# Patient Record
Sex: Female | Born: 1940 | Race: Black or African American | Hispanic: No | Smoking: Former smoker
Health system: Southern US, Community
[De-identification: ages and names within clinical notes are randomized; demographics above are authoritative.]

## PROBLEM LIST (undated history)

## (undated) DIAGNOSIS — M3500 Sicca syndrome, unspecified: Secondary | ICD-10-CM

## (undated) DIAGNOSIS — C50919 Malignant neoplasm of unspecified site of unspecified female breast: Secondary | ICD-10-CM

## (undated) DIAGNOSIS — E119 Type 2 diabetes mellitus without complications: Secondary | ICD-10-CM

## (undated) DIAGNOSIS — E7800 Pure hypercholesterolemia, unspecified: Secondary | ICD-10-CM

## (undated) DIAGNOSIS — C169 Malignant neoplasm of stomach, unspecified: Secondary | ICD-10-CM

## (undated) HISTORY — DX: Pure hypercholesterolemia, unspecified: E78.00

## (undated) HISTORY — PX: OTHER SURGICAL HISTORY: SHX169

## (undated) HISTORY — PX: ABDOMINAL HYSTERECTOMY: SHX81

## (undated) HISTORY — DX: Type 2 diabetes mellitus without complications: E11.9

## (undated) HISTORY — DX: Malignant neoplasm of unspecified site of unspecified female breast: C50.919

## (undated) HISTORY — DX: Sjogren syndrome, unspecified: M35.00

---

## 1975-01-14 HISTORY — PX: OTHER SURGICAL HISTORY: SHX169

## 1975-01-14 HISTORY — PX: MASTECTOMY: SHX3

## 2004-10-18 ENCOUNTER — Ambulatory Visit (HOSPITAL_COMMUNITY): Admission: RE | Admit: 2004-10-18 | Discharge: 2004-10-18 | Payer: Self-pay | Admitting: Family Medicine

## 2007-07-26 ENCOUNTER — Encounter: Admission: RE | Admit: 2007-07-26 | Discharge: 2007-07-26 | Payer: Self-pay | Admitting: Family Medicine

## 2008-11-24 ENCOUNTER — Encounter: Admission: RE | Admit: 2008-11-24 | Discharge: 2008-11-24 | Payer: Self-pay | Admitting: Family Medicine

## 2009-12-24 ENCOUNTER — Encounter
Admission: RE | Admit: 2009-12-24 | Discharge: 2009-12-24 | Payer: Self-pay | Source: Home / Self Care | Attending: Family Medicine | Admitting: Family Medicine

## 2010-01-03 ENCOUNTER — Encounter
Admission: RE | Admit: 2010-01-03 | Discharge: 2010-01-03 | Payer: Self-pay | Source: Home / Self Care | Attending: Family Medicine | Admitting: Family Medicine

## 2010-01-10 ENCOUNTER — Encounter
Admission: RE | Admit: 2010-01-10 | Discharge: 2010-01-10 | Payer: Self-pay | Source: Home / Self Care | Attending: Family Medicine | Admitting: Family Medicine

## 2010-01-31 ENCOUNTER — Ambulatory Visit (HOSPITAL_COMMUNITY)
Admission: RE | Admit: 2010-01-31 | Discharge: 2010-01-31 | Payer: Self-pay | Source: Home / Self Care | Attending: Surgery | Admitting: Surgery

## 2010-01-31 ENCOUNTER — Encounter
Admission: RE | Admit: 2010-01-31 | Discharge: 2010-01-31 | Payer: Self-pay | Source: Home / Self Care | Attending: Surgery | Admitting: Surgery

## 2010-02-03 NOTE — Op Note (Signed)
  NAMESINDEE, STUCKER                   ACCOUNT NO.:  [id number]  MEDICAL RECORD NO.:  [id number]          PATIENT TYPE:  AMB  LOCATION:  SDS                          FACILITY:  [facility]  PHYSICIAN:  Currie Paris, M.D.DATE OF BIRTH:  04-11-1940  DATE OF PROCEDURE:  01/31/2010 DATE OF DISCHARGE:  01/31/2010                              OPERATIVE REPORT   PREOPERATIVE DIAGNOSIS:  Left breast mass upper outer quadrant.  POSTOPERATIVE DIAGNOSIS:  Left breast mass upper outer quadrant.  PROCEDURE:  Needle-guided removal of left breast mass.  SURGEON:  Currie Paris, MD  ANESTHESIA:  General.  CLINICAL HISTORY:  This is a 70 year old lady who is status post a right mastectomy for breast cancer who has recently been found to have a mass in the upper outer quadrant of the left breast that looked like a papilloma by biopsy, but excisional biopsy was recommended.  DESCRIPTION OF PROCEDURE:  I saw the patient in the holding area and confirmed the plans for the surgery as noted above.  I reviewed the guidewire localization films.  The patient was taken to the operating room and after satisfactory general anesthesia had been obtained, the left breast was prepped and draped.  The time-out was done.  I made a curvilinear incision over the approximate tract of the guidewire.  I manipulated the guidewire into the wound and then took a cylinder of tissue around the guidewire until I was beyond the tip.  I thought I was well around the guidewire in all directions.  The clip appeared to be anterior to the guidewire and the lesion posterior to the guidewire.  Careful palpation of the specimen showed that there was a nodular density in the proximal location of the mass posterior to the wires I thought, we had the area well excised.  Specimen mammogram confirmed this.  The incision was infiltrated with 0.25% plain Marcaine.  I made sure everything was dry using cautery or suture  ligatures and then closed with 3-0 Vicryl, 4-0 Monocryl, subcuticular plus Dermabond.  The patient tolerated the procedure well and there were no complications.  All counts were correct.     Currie Paris, M.D.     CJS/MEDQ  D:  01/31/2010  T:  02/01/2010  Job:  [id number]  cc:   Gloriajean Dell. Andrey Campanile, M.D.  Electronically Signed by Cyndia Bent M.D. on 02/03/2010 07:38:44 AM

## 2010-02-04 LAB — BASIC METABOLIC PANEL
BUN: 14 mg/dL (ref 6–23)
CO2: 26 mEq/L (ref 19–32)
Chloride: 108 mEq/L (ref 96–112)
Glucose, Bld: 80 mg/dL (ref 70–99)
Potassium: 4.4 mEq/L (ref 3.5–5.1)

## 2010-02-04 LAB — CBC
HCT: 33.7 % — ABNORMAL LOW (ref 36.0–46.0)
Hemoglobin: 10.7 g/dL — ABNORMAL LOW (ref 12.0–15.0)
MCV: 86.6 fL (ref 78.0–100.0)
RDW: 14.2 % (ref 11.5–15.5)
WBC: 10.7 10*3/uL — ABNORMAL HIGH (ref 4.0–10.5)

## 2010-02-04 LAB — GLUCOSE, POCT (MANUAL RESULT ENTRY)
Glucose, Bld: 115 mg/dL — ABNORMAL HIGH (ref 70–99)
Operator id: 140821

## 2010-02-04 LAB — GLUCOSE, CAPILLARY
Glucose-Capillary: 132 mg/dL — ABNORMAL HIGH (ref 70–99)
Glucose-Capillary: 119 mg/dL — ABNORMAL HIGH (ref 70–99)

## 2010-08-26 ENCOUNTER — Encounter (INDEPENDENT_AMBULATORY_CARE_PROVIDER_SITE_OTHER): Payer: PRIVATE HEALTH INSURANCE | Admitting: Ophthalmology

## 2010-08-26 DIAGNOSIS — E11319 Type 2 diabetes mellitus with unspecified diabetic retinopathy without macular edema: Secondary | ICD-10-CM

## 2010-08-26 DIAGNOSIS — H43819 Vitreous degeneration, unspecified eye: Secondary | ICD-10-CM

## 2010-09-18 ENCOUNTER — Encounter (INDEPENDENT_AMBULATORY_CARE_PROVIDER_SITE_OTHER): Payer: PRIVATE HEALTH INSURANCE | Admitting: Ophthalmology

## 2010-09-18 DIAGNOSIS — E11311 Type 2 diabetes mellitus with unspecified diabetic retinopathy with macular edema: Secondary | ICD-10-CM

## 2010-09-18 DIAGNOSIS — H43819 Vitreous degeneration, unspecified eye: Secondary | ICD-10-CM

## 2010-09-18 DIAGNOSIS — E11319 Type 2 diabetes mellitus with unspecified diabetic retinopathy without macular edema: Secondary | ICD-10-CM

## 2010-10-17 ENCOUNTER — Encounter (INDEPENDENT_AMBULATORY_CARE_PROVIDER_SITE_OTHER): Payer: PRIVATE HEALTH INSURANCE | Admitting: Ophthalmology

## 2011-01-10 ENCOUNTER — Other Ambulatory Visit (INDEPENDENT_AMBULATORY_CARE_PROVIDER_SITE_OTHER): Payer: Self-pay | Admitting: Surgery

## 2011-01-13 ENCOUNTER — Other Ambulatory Visit: Payer: Self-pay | Admitting: Family Medicine

## 2011-01-13 DIAGNOSIS — Z1231 Encounter for screening mammogram for malignant neoplasm of breast: Secondary | ICD-10-CM

## 2011-01-13 DIAGNOSIS — Z9011 Acquired absence of right breast and nipple: Secondary | ICD-10-CM

## 2011-02-03 ENCOUNTER — Ambulatory Visit
Admission: RE | Admit: 2011-02-03 | Discharge: 2011-02-03 | Disposition: A | Discharge status: Home / Self Care | Payer: PRIVATE HEALTH INSURANCE | Source: Ambulatory Visit | Attending: Family Medicine | Admitting: Family Medicine

## 2011-02-03 DIAGNOSIS — Z9011 Acquired absence of right breast and nipple: Secondary | ICD-10-CM

## 2011-02-03 DIAGNOSIS — Z1231 Encounter for screening mammogram for malignant neoplasm of breast: Secondary | ICD-10-CM

## 2011-12-24 ENCOUNTER — Encounter (INDEPENDENT_AMBULATORY_CARE_PROVIDER_SITE_OTHER): Payer: PRIVATE HEALTH INSURANCE | Admitting: Ophthalmology

## 2011-12-24 DIAGNOSIS — H43819 Vitreous degeneration, unspecified eye: Secondary | ICD-10-CM

## 2011-12-24 DIAGNOSIS — E1139 Type 2 diabetes mellitus with other diabetic ophthalmic complication: Secondary | ICD-10-CM

## 2011-12-24 DIAGNOSIS — E11319 Type 2 diabetes mellitus with unspecified diabetic retinopathy without macular edema: Secondary | ICD-10-CM

## 2011-12-24 DIAGNOSIS — H3581 Retinal edema: Secondary | ICD-10-CM

## 2012-01-21 ENCOUNTER — Other Ambulatory Visit (INDEPENDENT_AMBULATORY_CARE_PROVIDER_SITE_OTHER): Payer: PRIVATE HEALTH INSURANCE | Admitting: Ophthalmology

## 2012-02-09 ENCOUNTER — Other Ambulatory Visit (INDEPENDENT_AMBULATORY_CARE_PROVIDER_SITE_OTHER): Payer: PRIVATE HEALTH INSURANCE | Admitting: Ophthalmology

## 2012-02-09 DIAGNOSIS — H3581 Retinal edema: Secondary | ICD-10-CM

## 2012-02-09 DIAGNOSIS — E1139 Type 2 diabetes mellitus with other diabetic ophthalmic complication: Secondary | ICD-10-CM

## 2012-02-09 DIAGNOSIS — E1165 Type 2 diabetes mellitus with hyperglycemia: Secondary | ICD-10-CM

## 2012-06-14 ENCOUNTER — Ambulatory Visit (INDEPENDENT_AMBULATORY_CARE_PROVIDER_SITE_OTHER): Payer: PRIVATE HEALTH INSURANCE | Admitting: Ophthalmology

## 2014-04-04 ENCOUNTER — Encounter: Payer: Self-pay | Admitting: Neurology

## 2014-04-04 ENCOUNTER — Ambulatory Visit (INDEPENDENT_AMBULATORY_CARE_PROVIDER_SITE_OTHER): Payer: Medicare (Managed Care) | Admitting: Neurology

## 2014-04-04 VITALS — BP 122/56 | HR 68 | Temp 97.0°F | Ht 64.0 in | Wt 181.0 lb

## 2014-04-04 DIAGNOSIS — H4900 Third [oculomotor] nerve palsy, unspecified eye: Secondary | ICD-10-CM | POA: Insufficient documentation

## 2014-04-04 DIAGNOSIS — H4903 Third [oculomotor] nerve palsy, bilateral: Secondary | ICD-10-CM

## 2014-04-04 DIAGNOSIS — H02403 Unspecified ptosis of bilateral eyelids: Secondary | ICD-10-CM | POA: Diagnosis not present

## 2014-04-04 DIAGNOSIS — H02409 Unspecified ptosis of unspecified eyelid: Secondary | ICD-10-CM | POA: Insufficient documentation

## 2014-04-04 DIAGNOSIS — H539 Unspecified visual disturbance: Secondary | ICD-10-CM

## 2014-04-04 NOTE — Patient Instructions (Signed)
Overall you are doing fairly well but I do want to suggest a few things today:   Remember to drink plenty of fluid, eat healthy meals and do not skip any meals. Try to eat protein with a every meal and eat a healthy snack such as fruit or nuts in between meals. Try to keep a regular sleep-wake schedule and try to exercise daily, particularly in the form of walking, 20-30 minutes a day, if you can.   As far as diagnostic testing: MRI of the brain and labwork  I would like to see you back in 3 weeks, sooner if we need to. Please call us with any interim questions, concerns, problems, updates or refill requests.   Please also call us for any test results so we can go over those with you on the phone.  My clinical assistant and will answer any of your questions and relay your messages to me and also relay most of my messages to you.   Our phone number is [phone]. We also have an after hours call service for urgent matters and there is a physician on-call for urgent questions. For any emergencies you know to call 911 or go to the nearest emergency room

## 2014-04-04 NOTE — Progress Notes (Addendum)
[facility]    Provider:  Dr Jaynee Eagles Referring Provider: Janifer Adie, MD Primary Care Physician:  No primary care provider on file.  CC:  Droopy eyelids  HPI:  Terry Alexander is a 74 y.o. female here as a referral from Dr. 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 PMHx diabetes, HLD, HTN. Her eyes are her biggest problem, they are droopy and dry. She gets her medical care by [facility]. She has seen an eye doctor and has another appointment in April for her droopy and dry eyes. Going on for 2 years, worsening. She denies double vision. She has problems with vision and wears glasses but no significant worsening. Denies difficulty speaking, no SOB or CP or problems with being out of breath. She has generalized weakness and decreased appetite. She can climb stairs, no difficulty with low seats. Her eye lids are not as droopy in the morning, worse in the evenings. She is very fatigued. She wakes up at 2am and can't get back to sleep.Mother had droopy eyelids and had surgery but no family history of neuromuscular disease as far as she know.   Review of Systems: Patient complains of symptoms per HPI as well as the following symptoms: fevers/chills, loss of vision/blurred vision, joint pain, constipation, aching muscles, joint pain, memory loss, headache, numbness, slurred speech, dizziness, insomnia, sleepiness, restless legs. Pertinent negatives per HPI. All others negative.   History   Social History  . Marital Status: Widowed    Spouse Name: N/A  . Number of Children: 1  . Years of Education: 2 yr colle   Occupational History  . Retired     Social History Main Topics  . Smoking status: Former Smoker    Quit date: 01/14/1991  . Smokeless tobacco: Not on file  . Alcohol Use: No  . Drug Use: No  . Sexual Activity: Not on file   Other Topics Concern  . Not on file   Social History Narrative   Lives at home alone.   Caffeine use: soft drinks (2-3 per day)     Family History  Problem Relation Age of Onset  . Heart attack Mother   . Prostate cancer Father   . Heart attack Father   . Heart attack Son     Past Medical History  Diagnosis Date  . Diabetes   . High cholesterol   . Breast cancer     Right side    Past Surgical History  Procedure Laterality Date  . Other surgical history  1977    Right breast removed   . Tumor removed      Stomach  . Abdominal hysterectomy      Current Outpatient Prescriptions  Medication Sig Dispense Refill  . aspirin 81 MG tablet Take 81 mg by mouth daily.    Marland Kitchen atorvastatin (LIPITOR) 10 MG tablet Take 10 mg by mouth daily.    . cycloSPORINE (RESTASIS) 0.05 % ophthalmic emulsion 1 drop 2 (two) times daily.    . dorzolamide (TRUSOPT) 2 % ophthalmic solution 1 drop 3 (three) times daily.    . hydrochlorothiazide (HYDRODIURIL) 25 MG tablet Take 25 mg by mouth daily.    . insulin aspart protamine - aspart (NOVOLOG 70/30 MIX) (70-30) 100 UNIT/ML FlexPen Inject into the skin 2 (two) times daily. 10 units in the morning 10 units in the evening    . Insulin Pen Needle (NOVOFINE) 30G X 8 MM MISC Inject 1 packet into the skin as needed.    Marland Kitchen  losartan (COZAAR) 50 MG tablet Take 50 mg by mouth daily.    Marland Kitchen loteprednol (LOTEMAX) 0.5 % ophthalmic suspension 4 (four) times daily.    Vladimir Faster Glycol-Propyl Glycol (SYSTANE OP) Apply 1 drop to eye as needed.    . ranitidine (ZANTAC) 150 MG tablet Take 150 mg by mouth 2 (two) times daily.     No current facility-administered medications for this visit.    Allergies as of 04/04/2014  . (No Known Allergies)    Vitals: BP 122/56 mmHg  Pulse 68  Temp(Src) 97 F (36.1 C)  Ht 5\' 4"  (1.626 m)  Wt 181 lb (82.101 kg)  BMI 31.05 kg/m2 Last Weight:  Wt Readings from Last 1 Encounters:  04/04/14 181 lb (82.101 kg)   Last Height:   Ht Readings from Last 1 Encounters:  04/04/14 5\' 4"  (1.626 m)    Physical exam: Exam: Gen: NAD, conversant, well nourised,  obese, well groomed                     CV: RRR. No Carotid Bruits. No peripheral edema, warm, nontender Eyes: Conjunctivae clear without exudates or hemorrhage, mild proptosis  Neuro: Detailed Neurologic Exam  Speech:    Speech is normal; fluent and spontaneous with normal comprehension.  Cognition:    The patient is oriented to person, place, and time;     recent and remote memory intact;     language fluent;     normal attention, concentration,     fund of knowledge Cranial Nerves:     The pupils are equal, round, and reactive to light. The fundi are flat. Visual fields are full to finger confrontation. Extraocular movements are intact. Trigeminal sensation is intact and the muscles of mastication are normal. Bilateral ptosis left > right to the level of the pupils. No fatiguable upgaze. Cannot visualize pharynx due to malampati 4. Hearing intact. Voice is normal. Shoulder shrug is normal. The tongue has normal motion without fasciculations.   Coordination:some difficultty with HTS due to weakness but no dysmetria FTN or HTS Gait:   Cannot perform heel-toe and tandem gait. Wide based gait.  Motor Observation:    No asymmetry, no atrophy, and no involuntary movements noted. Tone:    Normal muscle tone.    Posture:    Posture is normal. normal erect    Strength:can get out of chair without using arms. Bilateral hip flexion weakness.       Sensation: intact to LT     Reflex Exam:  DTR's:    Deep tendon reflexes in the upper and lower extremities are normal bilaterally.   Toes:    The toes are equivocal bilaterally.   Clonus:    Clonus is absent.  Other: Ice Pack Test Negative      Assessment/Plan:  74 year old female with progressive significant bilateral ptosis, not convinced this is better in the morning and worse as the day progresses as patient says. No significant other symptoms of MG. Ice Pack Test Negative (however patient could not tolerate cold pack on eye so  may not have been long enough to see effects). Mildy suspicious for occular myasthenia, but not convincing. Will order lab tests unfortunately sometimes these are not as sensitive with occular MG as they are in generalized MG. May need anti-MuSK testing, may need to send her to [facility]. She has some proptosis, will order thyroid panel. Less likely in this case is structural disease of the brainstem which can cause  isolated ocular symptoms but should get an MRI of the brain.   Sarina Ill, MD  [facility] [address] [place], [place] [address]  Phone [phone] Fax [phone]

## 2014-04-08 LAB — COMPREHENSIVE METABOLIC PANEL
ALT: 6 IU/L (ref 0–32)
AST: 13 IU/L (ref 0–40)
Albumin/Globulin Ratio: 1.2 (ref 1.1–2.5)
Albumin: 4.1 g/dL (ref 3.5–4.8)
Alkaline Phosphatase: 73 IU/L (ref 39–117)
BUN/Creatinine Ratio: 10 — ABNORMAL LOW (ref 11–26)
BUN: 17 mg/dL (ref 8–27)
Bilirubin Total: 0.4 mg/dL (ref 0.0–1.2)
CALCIUM: 9.3 mg/dL (ref 8.7–10.3)
CO2: 21 mmol/L (ref 18–29)
CREATININE: 1.7 mg/dL — AB (ref 0.57–1.00)
Chloride: 102 mmol/L (ref 97–108)
GFR calc Af Amer: 34 mL/min/{1.73_m2} — ABNORMAL LOW (ref >59)
GFR calc non Af Amer: 29 mL/min/{1.73_m2} — ABNORMAL LOW (ref >59)
GLOBULIN, TOTAL: 3.3 g/dL (ref 1.5–4.5)
GLUCOSE: 141 mg/dL — AB (ref 65–99)
Potassium: 4.3 mmol/L (ref 3.5–5.2)
SODIUM: 138 mmol/L (ref 134–144)
Total Protein: 7.4 g/dL (ref 6.0–8.5)

## 2014-04-08 LAB — THYROID PANEL WITH TSH
Free Thyroxine Index: 1.8 (ref 1.2–4.9)
T3 Uptake Ratio: 28 % (ref 24–39)
T4 TOTAL: 6.6 ug/dL (ref 4.5–12.0)
TSH: 1.37 u[IU]/mL (ref 0.450–4.500)

## 2014-04-08 LAB — ACETYLCHOLINE RECEPTOR, BINDING: AChR Binding Ab, Serum: 0.08 nmol/L (ref 0.00–0.24)

## 2014-04-08 LAB — ACETYLCHOLINE RECEPTOR, MODULATING

## 2014-04-08 LAB — CK: Total CK: 80 U/L (ref 24–173)

## 2014-04-08 LAB — ACETYLCHOLINE RECEPTOR, BLOCKING: ACETYLCHOL BLOCK AB: 20 % (ref 0–25)

## 2014-04-08 LAB — VGCC ANTIBODY: VGCC Antibody: NEGATIVE

## 2014-04-24 ENCOUNTER — Ambulatory Visit (INDEPENDENT_AMBULATORY_CARE_PROVIDER_SITE_OTHER): Payer: Medicare (Managed Care) | Admitting: Neurology

## 2014-04-24 ENCOUNTER — Encounter: Payer: Self-pay | Admitting: Neurology

## 2014-04-24 ENCOUNTER — Telehealth: Payer: Self-pay | Admitting: *Deleted

## 2014-04-24 VITALS — BP 108/67 | HR 66 | Temp 98.3°F | Ht 64.0 in | Wt 182.0 lb

## 2014-04-24 DIAGNOSIS — H02403 Unspecified ptosis of bilateral eyelids: Secondary | ICD-10-CM

## 2014-04-24 MED ORDER — PYRIDOSTIGMINE BROMIDE 60 MG PO TABS: 60.0000 mg | ORAL_TABLET | Freq: Three times a day (TID) | ORAL | Status: AC

## 2014-04-24 NOTE — Telephone Encounter (Signed)
Release fax to Dr. Valetta Close requesting records.

## 2014-04-24 NOTE — Progress Notes (Signed)
[facility]    Provider:  Dr Jaynee Eagles Referring Provider: No ref. provider found Primary Care Physician:  No primary care provider on file.  CC:  Droopy eyelids  Spoke to ophthalmologist. His opinion is that this is not myasthenia gravis. Patient's symptoms are not significantly dependent on time of day. Antibodies to MG were negative. Cannot get anti-musk antibodies, [facility] will not pay for this test and patient cannot afford it. Discussed with patient. She was referred to a specialist by ophthalmology and we will follow her clinically. She declined an emncs to evaluate for any generalized myasthenia. She has no other symptoms to be suspicious for MG.  Initial visit 04/04/2014:  Terry Alexander is a 74 y.o. female here as a referral from Dr. 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 PMHx diabetes, HLD, HTN. Her eyes are her biggest problem, they are droopy and dry. She gets her medical care by [facility]. She has seen an eye doctor and has another appointment in April for her droopy and dry eyes. Going on for 2 years, worsening. She denies double vision. She has problems with vision and wears glasses but no significant worsening. Denies difficulty speaking, no SOB or CP or problems with being out of breath. She has generalized weakness and decreased appetite. She can climb stairs, no difficulty with low seats. Her eye lids are not as droopy in the morning, worse in the evenings. She is very fatigued. She wakes up at 2am and can't get back to sleep.Mother had droopy eyelids and had surgery but no family history of neuromuscular disease as far as she know.   Review of Systems: Patient complains of symptoms per HPI as well as the following symptoms:dizziness, headache, numbness, ringing in ears, nervous/anxious constipation, aching muscles. Pertinent negatives per HPI. All others negative.   History   Social History  . Marital Status: Widowed    Spouse Name: N/A  . Number of Children: 1    . Years of Education: 2 yr colle   Occupational History  . Retired     Social History Main Topics  . Smoking status: Former Smoker    Quit date: 01/14/1991  . Smokeless tobacco: Not on file  . Alcohol Use: No  . Drug Use: No  . Sexual Activity: Not on file   Other Topics Concern  . Not on file   Social History Narrative   Lives at home alone.   Right handed.   Caffeine use: soft drinks (2-3 per day)    Family History  Problem Relation Age of Onset  . Heart attack Mother   . Prostate cancer Father   . Heart attack Father   . Heart attack Son     Past Medical History  Diagnosis Date  . Diabetes   . High cholesterol   . Breast cancer     Right side    Past Surgical History  Procedure Laterality Date  . Other surgical history  1977    Right breast removed   . Tumor removed      Stomach  . Abdominal hysterectomy      Current Outpatient Prescriptions  Medication Sig Dispense Refill  . aspirin 81 MG tablet Take 81 mg by mouth daily.    Marland Kitchen atorvastatin (LIPITOR) 10 MG tablet Take 10 mg by mouth daily.    . cycloSPORINE (RESTASIS) 0.05 % ophthalmic emulsion 1 drop 2 (two) times daily.    . dorzolamide (TRUSOPT) 2 % ophthalmic solution 1 drop 3 (three) times  daily.    . hydrochlorothiazide (HYDRODIURIL) 25 MG tablet Take 25 mg by mouth daily.    . insulin aspart protamine - aspart (NOVOLOG 70/30 MIX) (70-30) 100 UNIT/ML FlexPen Inject into the skin 2 (two) times daily. 10 units in the morning 10 units in the evening    . Insulin Pen Needle (NOVOFINE) 30G X 8 MM MISC Inject 1 packet into the skin as needed.    Marland Kitchen losartan (COZAAR) 50 MG tablet Take 50 mg by mouth daily.    Marland Kitchen loteprednol (LOTEMAX) 0.5 % ophthalmic suspension 4 (four) times daily.    Vladimir Faster Glycol-Propyl Glycol (SYSTANE OP) Apply 1 drop to eye as needed.    . ranitidine (ZANTAC) 150 MG tablet Take 150 mg by mouth 2 (two) times daily.    Marland Kitchen pyridostigmine (MESTINON) 60 MG tablet Take 1 tablet (60 mg  total) by mouth 3 (three) times daily. 90 tablet 3   No current facility-administered medications for this visit.    Allergies as of 04/24/2014  . (No Known Allergies)    Vitals: BP 108/67 mmHg  Pulse 66  Temp(Src) 98.3 F (36.8 C)  Ht 5\' 4"  (1.626 m)  Wt 182 lb (82.555 kg)  BMI 31.22 kg/m2 Last Weight:  Wt Readings from Last 1 Encounters:  04/24/14 182 lb (82.555 kg)   Last Height:   Ht Readings from Last 1 Encounters:  04/24/14 5\' 4"  (1.626 m)   Cranial Nerves:   The pupils are equal, round, and reactive to light. The fundi are flat. Visual fields are full to finger confrontation. Extraocular movements are intact. Trigeminal sensation is intact and the muscles of mastication are normal. Bilateral ptosis left > right to the level of the pupils. No fatiguable upgaze. Cannot visualize pharynx due to malampati 4. Hearing intact. Voice is normal. Shoulder shrug is normal. The tongue has normal motion without fasciculations. Ice pack test negative   Coordination:some difficultty with HTS due to weakness but no dysmetria FTN or HTS Gait: Cannot perform heel-toe and tandem gait. Wide based gait.  Motor Observation:  No asymmetry, no atrophy, and no involuntary movements noted. Tone:  Normal muscle tone.   Posture:  Posture is normal. normal erect   Strength:can get out of chair without using arms. Bilateral hip flexion weakness.    Sensation: intact to LT        Assessment/Plan:  74 year old female with progressive significant bilateral ptosis, not convinced this is better in the morning and worse as the day progresses. No significant other symptoms of MG. Ice Pack Test Negative. 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 His opinion is that this is not myasthenia gravis. Antibodies to MG were negative. Cannot get anti-musk antibodies, [facility] will not pay for this test and patient cannot afford it. Discussed with patient. She was  referred to a specialist by ophthalmology and we will follow her clinically and see what specialist findings are.   She declined an emncs to evaluate for any generalized myasthenia. She has no other symptoms to be suspicious for MG.   She has some proptosis, will thyroid panel normal . Less likely in this case is structural disease of the brainstem which can cause isolated ocular symptoms but should get an MRI of the brain.   Discussed chronic kidney disease and gave a copy of her labs to discuss with her pcp   Sarina Ill, MD  [facility] [address] [place], [place] [address]  Phone  [phone] Fax [phone]  A total of 15 minutes was spent face-to-face with this patient. Over half this time was spent on counseling patient on the myasthenia gravis diagnosis and different diagnostic and therapeutic options available.

## 2014-04-25 ENCOUNTER — Encounter: Payer: Self-pay | Admitting: *Deleted

## 2014-08-02 ENCOUNTER — Ambulatory Visit: Payer: Medicare (Managed Care) | Admitting: Neurology

## 2015-03-08 ENCOUNTER — Other Ambulatory Visit: Payer: Self-pay | Admitting: Family Medicine

## 2015-03-08 DIAGNOSIS — Z1231 Encounter for screening mammogram for malignant neoplasm of breast: Secondary | ICD-10-CM

## 2015-03-20 ENCOUNTER — Ambulatory Visit
Admission: RE | Admit: 2015-03-20 | Discharge: 2015-03-20 | Disposition: A | Discharge status: Home / Self Care | Payer: Medicare (Managed Care) | Source: Ambulatory Visit | Attending: Family Medicine | Admitting: Family Medicine

## 2015-03-20 DIAGNOSIS — Z1231 Encounter for screening mammogram for malignant neoplasm of breast: Secondary | ICD-10-CM

## 2016-02-13 ENCOUNTER — Other Ambulatory Visit: Payer: Self-pay | Admitting: Family Medicine

## 2016-02-13 DIAGNOSIS — Z1231 Encounter for screening mammogram for malignant neoplasm of breast: Secondary | ICD-10-CM

## 2016-03-25 ENCOUNTER — Ambulatory Visit: Payer: Medicare (Managed Care)

## 2018-10-04 ENCOUNTER — Other Ambulatory Visit: Payer: Self-pay | Admitting: Nurse Practitioner

## 2018-10-04 ENCOUNTER — Ambulatory Visit
Admission: RE | Admit: 2018-10-04 | Discharge: 2018-10-04 | Disposition: A | Discharge status: Home / Self Care | Payer: No Typology Code available for payment source | Source: Ambulatory Visit | Attending: Nurse Practitioner | Admitting: Nurse Practitioner

## 2018-10-04 DIAGNOSIS — R52 Pain, unspecified: Secondary | ICD-10-CM

## 2018-10-04 DIAGNOSIS — W19XXXA Unspecified fall, initial encounter: Secondary | ICD-10-CM

## 2019-07-11 DIAGNOSIS — E7800 Pure hypercholesterolemia, unspecified: Secondary | ICD-10-CM | POA: Insufficient documentation

## 2019-07-11 DIAGNOSIS — E119 Type 2 diabetes mellitus without complications: Secondary | ICD-10-CM | POA: Insufficient documentation

## 2019-07-11 DIAGNOSIS — I10 Essential (primary) hypertension: Secondary | ICD-10-CM | POA: Insufficient documentation

## 2019-12-06 ENCOUNTER — Ambulatory Visit (HOSPITAL_COMMUNITY)
Admission: RE | Admit: 2019-12-06 | Discharge: 2019-12-06 | Disposition: A | Discharge status: Home / Self Care | Payer: Medicare (Managed Care) | Source: Ambulatory Visit | Attending: Vascular Surgery | Admitting: Vascular Surgery

## 2019-12-06 ENCOUNTER — Other Ambulatory Visit: Payer: Self-pay

## 2019-12-06 ENCOUNTER — Other Ambulatory Visit (HOSPITAL_COMMUNITY): Payer: Self-pay | Admitting: Family Medicine

## 2019-12-06 DIAGNOSIS — M79661 Pain in right lower leg: Secondary | ICD-10-CM | POA: Diagnosis present

## 2019-12-20 NOTE — Progress Notes (Signed)
Office Visit Note  Patient: Terry Alexander             Date of Birth: Jan 06, 1941           MRN: [id number]             PCP: [facility] Referring: Ernestina Columbia, MD Visit Date: 12/21/2019   Subjective:   History of Present Illness: Terry Alexander is a 79 y.o. 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 Symptoms are now improving and she is continuing just ophthalmic treatment for this. During these past 2 months she also describes increased skin color changes on her arms, dry mouth, and since about 2 weeks ago increasing leg swelling which was evaluated in the ED that did not find any blood clot. Overall most of these symptoms are new although she has had dry eyes and mouth for years without major complications. She denies hair loss, lymphadenopathy, fevers, shortness of breath, raynaud's phenomenon. She does report having some unintentional weight loss but that it is now improving.  Activities of Daily Living:  Patient reports morning stiffness for 0 minutes.   Patient Denies nocturnal pain.  Difficulty dressing/grooming: Denies Difficulty climbing stairs: Reports Difficulty getting out of chair: Denies Difficulty using hands for taps, buttons, cutlery, and/or writing: Denies  Review of Systems  Constitutional: Positive for weight loss. Negative for fatigue.  HENT: Positive for mouth dryness. Negative for mouth sores and nose dryness.   Eyes: Positive for dryness. Negative for pain, itching and visual disturbance.  Respiratory: Negative for cough, hemoptysis, shortness of breath and difficulty breathing.   Cardiovascular: Positive for swelling in legs/feet. Negative for chest pain and palpitations.  Gastrointestinal: Positive for constipation and diarrhea. Negative for abdominal pain and blood in stool.  Endocrine: Negative for increased  urination.  Genitourinary: Negative for painful urination.  Musculoskeletal: Negative for arthralgias, joint pain, joint swelling, myalgias, muscle weakness, morning stiffness, muscle tenderness and myalgias.  Skin: Negative for color change, rash and redness.  Allergic/Immunologic: Negative for susceptible to infections.  Neurological: Negative for dizziness, numbness, headaches, memory loss and weakness.  Hematological: Negative for swollen glands.  Psychiatric/Behavioral: Negative for confusion and sleep disturbance.    PMFS History:  Patient Active Problem List   Diagnosis Date Noted  . Positive ANA (antinuclear antibody) 12/21/2019  . 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/2019  . Lower extremity edema 12/21/2019  . 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 Diabetes mellitus ([place]) 07/11/2019  . Hypercholesterolemia 07/11/2019  . Hypertensive disorder 07/11/2019  . 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 Vision changes 04/04/2014    Past Medical History:  Diagnosis Date  . Breast cancer (HCC)    Right side  . Diabetes ([place])   . High cholesterol     Family History  Problem Relation Age of Onset  . Heart attack Mother   . Prostate cancer Father   . Heart attack Father   . Heart attack Son    Past Surgical History:  Procedure Laterality Date  . ABDOMINAL HYSTERECTOMY    . MASTECTOMY Right 1977  . OTHER SURGICAL HISTORY  1977   Right breast removed   . Tumor removed     Stomach   Social History   Social History Narrative   Lives at home alone.   Right handed.   Caffeine use: soft drinks (2-3 per day)  Immunization History  Administered Date(s) Administered  . Moderna Sars-Covid-2 Vaccination 02/15/2019, 03/15/2019     Objective: Vital Signs: BP 110/69 (BP Location: Left Arm, Patient Position: Sitting, Cuff Size: Small)   Pulse 89   Ht 5' 2.75" (1.594 m)   Wt 146 lb 12.8 oz (66.6 kg)   BMI 26.21 kg/m    Physical Exam HENT:     Right  Ear: External ear normal.     Left Ear: External ear normal.     Mouth/Throat:     Mouth: Mucous membranes are dry.  Eyes:     Comments: Conjunctiva injected  Cardiovascular:     Rate and Rhythm: Normal rate and regular rhythm.  Pulmonary:     Effort: Pulmonary effort is normal.     Breath sounds: Normal breath sounds.  Skin:    General: Skin is warm and dry.     Comments: Perifollicular hyperpigmentation present on arms on extensor surfaces, with some patches of confluent hyperpigmentation  Decreased nailfold capillary density without giant capillaries or hemorrhages  Shiny appearance with decreased hair overlying shin, ankle, and foot R>L  Neurological:     General: No focal deficit present.     Mental Status: She is alert.  Psychiatric:        Mood and Affect: Mood normal.      Musculoskeletal Exam:  Neck full range of motion no tenderness Shoulder, elbow, wrist, fingers full range of motion no tenderness or swelling Normal hip internal and external rotation without pain Knees patellofemoral crepitus, without palpable effusions, ankles significant overlying pitting edema bilaterally R>L    Investigation: No additional findings.  Imaging: VAS [place] LOWER EXTREMITY VENOUS (DVT)  Result Date: 12/06/2019  Lower Venous DVT Study Indications: Pain, and Swelling.  Performing Technologist: Ralene Cork RVT  Examination Guidelines: A complete evaluation includes B-mode imaging, spectral Doppler, color Doppler, and power Doppler as needed of all accessible portions of each vessel. Bilateral testing is considered an integral part of a complete examination. Limited examinations for reoccurring indications may be performed as noted. The reflux portion of the exam is performed with the patient in reverse Trendelenburg.  +---------+---------------+---------+-----------+----------+--------------+ RIGHT    CompressibilityPhasicitySpontaneityPropertiesThrombus Aging  +---------+---------------+---------+-----------+----------+--------------+ CFV      Full           Yes      Yes                                 +---------+---------------+---------+-----------+----------+--------------+ SFJ      Full                    Yes                                 +---------+---------------+---------+-----------+----------+--------------+ FV Prox  Full           Yes      Yes                                 +---------+---------------+---------+-----------+----------+--------------+ FV Mid   Full           Yes      Yes                                 +---------+---------------+---------+-----------+----------+--------------+  FV DistalFull           Yes      Yes                                 +---------+---------------+---------+-----------+----------+--------------+ POP      Full           Yes      Yes                                 +---------+---------------+---------+-----------+----------+--------------+ PTV      Full                    Yes                                 +---------+---------------+---------+-----------+----------+--------------+ PERO     Full                    Yes                                 +---------+---------------+---------+-----------+----------+--------------+ GSV      Full           Yes      Yes                                 +---------+---------------+---------+-----------+----------+--------------+  Findings reported to left voicemail at 9:08 am.  Summary: RIGHT: - There is no evidence of deep vein thrombosis in the lower extremity. - There is no evidence of superficial venous thrombosis.  - A cystic structure is found in the popliteal fossa. - Interstitial fluid in the calf.   *See table(s) above for measurements and observations. Electronically signed by Monica Martinez MD on 12/06/2019 at 1:23:38 PM.    Final     Recent Labs: Lab Results  Component Value Date   WBC 7.7 12/21/2019    HGB 9.9 (L) 12/21/2019   PLT 391 12/21/2019   NA 135 12/21/2019   K 4.4 12/21/2019   CL 102 12/21/2019   CO2 25 12/21/2019   GLUCOSE 195 (H) 12/21/2019   BUN 14 12/21/2019   CREATININE 1.02 (H) 12/21/2019   BILITOT 0.4 12/21/2019   ALKPHOS 73 04/04/2014   AST 16 12/21/2019   ALT 9 12/21/2019   PROT 5.8 (L) 12/21/2019   ALBUMIN 4.1 04/04/2014   CALCIUM 8.5 (L) 12/21/2019   GFRAA 60 12/21/2019    Speciality Comments: No specialty comments available.  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 This seems to be improving with current ophthalmology treatment but would benefit from steroid sparing DMARD limited data in optimal choice but azathioprine or methotrexate may be first line options. Will obtain baseline labs including CBC, CMP, immunoglobulins, and chest xray. Interestingly she also has highly positive  anticentromere antibodies. Her upper extremity perifollicular hyperpigmentation is consistent with systemic sclerosis but no raynaud's phenomenon or other obvious manifestations. With her lower extremity edema may need screening for pulmonary hypertension but would start with baseline chest xray.  Lower extremity edema - Plan: Protein / creatinine ratio, urine  She reports her current R>L bilateral lower extremity edema is a recent development. Will check urine P/C ratio and CXR for evidence of nephrotic syndrome or pulmonary congestion.  Orders: Orders Placed This Encounter  Procedures  . DG Chest 2 View  . Sedimentation  rate  . IgG, IgA, IgM  . CBC with Differential/Platelet  . Protein / creatinine ratio, urine  . COMPLETE METABOLIC PANEL WITH GFR   No orders of the defined types were placed in this encounter.    Follow-Up Instructions: Return in about 4 weeks (around 01/18/2020).   Collier Salina, MD  Note - This record has been created using [employer].  Chart creation errors have been sought, but may not always  have been located. Such creation errors do not reflect on  the standard of medical care.

## 2019-12-21 ENCOUNTER — Encounter: Payer: Self-pay | Admitting: Internal Medicine

## 2019-12-21 ENCOUNTER — Ambulatory Visit (INDEPENDENT_AMBULATORY_CARE_PROVIDER_SITE_OTHER): Payer: Medicare (Managed Care) | Admitting: Internal Medicine

## 2019-12-21 ENCOUNTER — Other Ambulatory Visit: Payer: Self-pay

## 2019-12-21 VITALS — BP 110/69 | HR 89 | Ht 62.75 in | Wt 146.8 lb

## 2019-12-21 DIAGNOSIS — R768 Other specified abnormal immunological findings in serum: Secondary | ICD-10-CM | POA: Diagnosis not present

## 2019-12-21 DIAGNOSIS — M3501 Sicca syndrome with keratoconjunctivitis: Secondary | ICD-10-CM | POA: Diagnosis not present

## 2019-12-21 DIAGNOSIS — R600 Localized edema: Secondary | ICD-10-CM

## 2019-12-21 DIAGNOSIS — H16003 Unspecified corneal ulcer, bilateral: Secondary | ICD-10-CM | POA: Diagnosis not present

## 2019-12-21 DIAGNOSIS — H16009 Unspecified corneal ulcer, unspecified eye: Secondary | ICD-10-CM | POA: Insufficient documentation

## 2019-12-21 NOTE — Patient Instructions (Signed)
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  Your eye dryness and inflammation and mouth dryness are related to this. I would continue follow up with the ophthalmologist for management of your eye problem.  For mouth dryness I would start with sugar free gum or lozenges and biotene mouth spray for symptoms, prescription medication may cause more side effect than benefits unless you have much worse pain.  You also have antibodies that can be associated with scleroderma. This can cause problems of the skin, lung, esophagus, blood vessels, and kidney so I recommend getting a chest xray to screen for changes and also checking your urine for protein if there is any problem in the kidney. The leg swelling is not usually a feature of this disease, but your doctors may consider an ultrasound of the heart if no other cause is apparent.   Sjgren's Syndrome Sjgren's syndrome is a disease in which the body's disease-fighting system (immune system) attacks the glands that produce tears (lacrimal glands) and the glands that produce saliva (salivary glands). This makes the eyes and mouth very dry. Sjgren's syndrome is a long-term (chronic) disorder that has no cure. In some cases, it is linked to other disorders (rheumatic disorders), such as rheumatoid arthritis and systemic lupus erythematosus (SLE). It may affect other parts of the body, such as the:  Kidneys.  Blood vessels.  Joints.  Lungs.  Liver.  Pancreas.  Brain.  Nerves.  Spinal cord. What are the causes? The cause of this condition is not known. It may be passed along from parent to child (inherited), or it may be a symptom of a rheumatic disorder. What increases the risk? This condition is more likely to develop in:  Women.  People who are 70-78 years old.  People who have recently had a viral infection or currently have a viral infection. What are the signs or symptoms? The main symptoms of this condition  are:  Dry mouth. This may include: ? A chalky feeling. ? Difficulty swallowing, speaking, or tasting. ? Frequent cavities in the teeth. ? Frequent mouth infections.  Dry eyes. This may include: ? Burning, redness, and itching. ? Blurry vision. ? Light sensitivity. Other symptoms may include:  Dryness of the skin and the inside of the nose.  Eyelid infections.  Vaginal dryness, if this applies.  Joint pain and stiffness.  Muscle pain and stiffness. How is this diagnosed? This condition is diagnosed based on:  Your symptoms.  Your medical history.  A physical exam of your eyes and mouth.  Tests, including: ? A Schirmer test. This tests your tear production. ? An eye exam that is done with a magnifying device (slit-lamp exam). ? An eye test that temporarily stains your eye with dye. This shows the extent of eye damage. ? Tests to check your salivary gland function. ? Biopsy. This is a removal of part of a salivary gland from inside your lower lip to be studied under a microscope. ? Chest X-rays. ? Blood tests. ? Urine tests. How is this treated? There is no cure for this condition, but treatment can help you manage your symptoms. This condition may be treated with:  Moisture replacement therapies to help relieve dryness in your skin, mouth, and eyes.  Medicines to help relieve pain and stiffness.  Medicines to help relieve inflammation in your body (corticosteroids). These are usually for severe cases.  Medicines to help reduce the activity of your immune system (immunosuppressants).  Surgery or insertion of plugs to close  the lacrimal glands (punctal occlusion). This helps keep more natural tears in your eyes. Follow these instructions at home: Eye care   Use eye drops as told by your health care provider.  Protect your eyes from the sun and wind with sunglasses or glasses.  Blink at least 5-6 times a minute.  Maintain properly humidified air. You may want  to use a humidifier at home.  Avoid smoke. Mouth care  Brush your teeth and floss after every meal.  Chew sugar-free gum or suck on hard candy. This may help to relieve dry mouth.  Use antimicrobial mouthwash daily.  Take frequent sips of water or sugar-free drinks.  Use saliva substitutes or lip balm as told by your health care provider.  Schedule and attend dentist visits every 6 months. General instructions   Take over-the-counter and prescription medicines only as told by your health care provider.  Drink enough fluid to keep your urine pale yellow.  Keep all follow-up visits as told by your health care provider. This is important. Contact a health care provider if:  You have a fever.  You have night sweats.  You are always tired.  You have unexplained weight loss.  You develop itchy skin.  You have red patches on your skin.  You have a lump or swelling on your neck. Summary  Sjgren's syndrome is a disease in which the body's disease-fighting system attacks the glands that produce tears and the glands that produce saliva.  This condition makes the eyes and mouth very dry.  Sjgren's syndrome is a long-term (chronic) disorder that has no cure.  The cause of this condition is not known.  There is no cure for this condition, but treatment can help you manage your symptoms.

## 2019-12-22 LAB — COMPLETE METABOLIC PANEL WITH GFR
AG Ratio: 1.1 (calc) (ref 1.0–2.5)
ALT: 9 U/L (ref 6–29)
AST: 16 U/L (ref 10–35)
Albumin: 3 g/dL — ABNORMAL LOW (ref 3.6–5.1)
Alkaline phosphatase (APISO): 88 U/L (ref 37–153)
BUN/Creatinine Ratio: 14 (calc) (ref 6–22)
BUN: 14 mg/dL (ref 7–25)
CO2: 25 mmol/L (ref 20–32)
Calcium: 8.5 mg/dL — ABNORMAL LOW (ref 8.6–10.4)
Chloride: 102 mmol/L (ref 98–110)
Creat: 1.02 mg/dL — ABNORMAL HIGH (ref 0.60–0.88)
GFR, Est African American: 60 mL/min/{1.73_m2} (ref >=60)
GFR, Est Non African American: 52 mL/min/{1.73_m2} — ABNORMAL LOW (ref >=60)
Globulin: 2.8 g/dL (calc) (ref 1.9–3.7)
Glucose, Bld: 195 mg/dL — ABNORMAL HIGH (ref 65–99)
Potassium: 4.4 mmol/L (ref 3.5–5.3)
Sodium: 135 mmol/L (ref 135–146)
Total Bilirubin: 0.4 mg/dL (ref 0.2–1.2)
Total Protein: 5.8 g/dL — ABNORMAL LOW (ref 6.1–8.1)

## 2019-12-22 LAB — CBC WITH DIFFERENTIAL/PLATELET
Absolute Monocytes: 724 cells/uL (ref 200–950)
Basophils Absolute: 39 cells/uL (ref 0–200)
Basophils Relative: 0.5 %
Eosinophils Absolute: 270 cells/uL (ref 15–500)
Eosinophils Relative: 3.5 %
HCT: 30.4 % — ABNORMAL LOW (ref 35.0–45.0)
Hemoglobin: 9.9 g/dL — ABNORMAL LOW (ref 11.7–15.5)
Lymphs Abs: 2695 cells/uL (ref 850–3900)
MCH: 32 pg (ref 27.0–33.0)
MCHC: 32.6 g/dL (ref 32.0–36.0)
MCV: 98.4 fL (ref 80.0–100.0)
MPV: 10.4 fL (ref 7.5–12.5)
Monocytes Relative: 9.4 %
Neutro Abs: 3973 cells/uL (ref 1500–7800)
Neutrophils Relative %: 51.6 %
Platelets: 391 10*3/uL (ref 140–400)
RBC: 3.09 10*6/uL — ABNORMAL LOW (ref 3.80–5.10)
RDW: 13.5 % (ref 11.0–15.0)
Total Lymphocyte: 35 %
WBC: 7.7 10*3/uL (ref 3.8–10.8)

## 2019-12-22 LAB — PROTEIN / CREATININE RATIO, URINE
Creatinine, Urine: 96 mg/dL (ref 20–275)
Protein/Creat Ratio: 198 mg/g creat — ABNORMAL HIGH (ref 21–161)
Protein/Creatinine Ratio: 0.198 mg/mg creat — ABNORMAL HIGH (ref 0.021–0.16)
Total Protein, Urine: 19 mg/dL (ref 5–24)

## 2019-12-22 LAB — IGG, IGA, IGM
IgG (Immunoglobin G), Serum: 1351 mg/dL (ref 600–1540)
IgM, Serum: 112 mg/dL (ref 50–300)
Immunoglobulin A: 275 mg/dL (ref 70–320)

## 2019-12-22 LAB — SEDIMENTATION RATE: Sed Rate: 6 mm/h (ref 0–30)

## 2020-01-18 ENCOUNTER — Ambulatory Visit: Payer: Medicare (Managed Care) | Admitting: Internal Medicine

## 2020-01-31 ENCOUNTER — Ambulatory Visit: Payer: Medicare (Managed Care) | Admitting: Internal Medicine

## 2020-01-31 NOTE — Progress Notes (Deleted)
Office Visit Note  Patient: Terry Alexander             Date of Birth: 20-Nov-1940           MRN: [id number]             PCP: [facility] [place] Referring: [place] Visit Date: 01/31/2020   Subjective:  No chief complaint on file.   History of Present Illness: Terry Alexander is a 80 y.o. female here for follow up of sjogren's syndrome with keratoconjunctivitis sicca and recent development of corneal melt receiving initial treatment with steroids.***     No Rheumatology ROS completed.   PMFS History:  Patient Active Problem List   Diagnosis Date Noted  . Positive ANA (antinuclear antibody) 12/21/2019  . 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/2019  . Lower extremity edema 12/21/2019  . 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 Diabetes mellitus ([place]) 07/11/2019  . Hypercholesterolemia 07/11/2019  . Hypertensive disorder 07/11/2019  . 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 Vision changes 04/04/2014    Past Medical History:  Diagnosis Date  . Breast cancer (HCC)    Right side  . Diabetes ([place])   . High cholesterol     Family History  Problem Relation Age of Onset  . Heart attack Mother   . Prostate cancer Father   . Heart attack Father   . Heart attack Son    Past Surgical History:  Procedure Laterality Date  . ABDOMINAL HYSTERECTOMY    . MASTECTOMY Right 1977  . OTHER SURGICAL HISTORY  1977   Right breast removed   . Tumor removed     Stomach   Social History   Social History Narrative   Lives at home alone.   Right handed.   Caffeine use: soft drinks (2-3 per day)   Immunization History  Administered Date(s) Administered  . Moderna Sars-Covid-2 Vaccination 02/15/2019, 03/15/2019     Objective: Vital Signs: There were no vitals taken for this visit.   Physical Exam   Musculoskeletal Exam: ***  CDAI Exam: CDAI Score: -- Patient Global: --; Provider Global: -- Swollen: --; Tender: -- Joint  Exam 01/31/2020   No joint exam has been documented for this visit   There is currently no information documented on the homunculus. Go to the Rheumatology activity and complete the homunculus joint exam.  Investigation: No additional findings.  Imaging: No results found.  Recent Labs: Lab Results  Component Value Date   WBC 7.7 12/21/2019   HGB 9.9 (L) 12/21/2019   PLT 391 12/21/2019   NA 135 12/21/2019   K 4.4 12/21/2019   CL 102 12/21/2019   CO2 25 12/21/2019   GLUCOSE 195 (H) 12/21/2019   BUN 14 12/21/2019   CREATININE 1.02 (H) 12/21/2019   BILITOT 0.4 12/21/2019   ALKPHOS 73 04/04/2014   AST 16 12/21/2019   ALT 9 12/21/2019   PROT 5.8 (L) 12/21/2019   ALBUMIN 4.1 04/04/2014   CALCIUM 8.5 (L) 12/21/2019   GFRAA 60 12/21/2019    Speciality Comments: No specialty comments available.  Procedures:  No procedures performed Allergies: Codeine   Assessment / Plan:     Visit Diagnoses: No diagnosis found.  Orders: No orders of the defined types were placed in this encounter.  No orders of the defined types were placed in this encounter.   Face-to-face time spent with patient was *** minutes. Greater than 50% of time was spent  in counseling and coordination of care.  Follow-Up Instructions: No follow-ups on file.   Collier Salina, MD  Note - This record has been created using [employer].  Chart creation errors have been sought, but may not always  have been located. Such creation errors do not reflect on  the standard of medical care.

## 2020-02-03 ENCOUNTER — Other Ambulatory Visit: Payer: Self-pay | Admitting: Vascular Surgery

## 2020-02-03 ENCOUNTER — Ambulatory Visit
Admission: RE | Admit: 2020-02-03 | Discharge: 2020-02-03 | Disposition: A | Discharge status: Home / Self Care | Payer: Medicare (Managed Care) | Source: Ambulatory Visit | Attending: Vascular Surgery | Admitting: Vascular Surgery

## 2020-02-03 ENCOUNTER — Other Ambulatory Visit: Payer: Self-pay

## 2020-02-03 DIAGNOSIS — I272 Pulmonary hypertension, unspecified: Secondary | ICD-10-CM

## 2020-02-12 NOTE — Progress Notes (Signed)
Office Visit Note  Patient: Terry Alexander             Date of Birth: September 18, 1940           MRN: [id number]             PCP: [facility] [place] Referring: [place] Visit Date: 02/13/2020   Subjective:  Follow-up   History of Present Illness: Terry Alexander is a 80 y.o. 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 Since her last visit steroid eye drops were decreased to once daily with apparently good control of the inflammation. Otherwise she is feeling in about her usual health. Skin rash on the arms she thinks is about the same, leg swellign is slightly better than before.    Review of Systems  Constitutional: Negative for fatigue.  HENT: Negative for mouth sores, mouth dryness and nose dryness.   Eyes: Positive for dryness. Negative for pain, itching and visual disturbance.  Respiratory: Negative for cough, hemoptysis, shortness of breath and difficulty breathing.   Cardiovascular: Negative for chest pain, palpitations and swelling in legs/feet.  Gastrointestinal: Negative for abdominal pain, blood in stool, constipation and diarrhea.  Endocrine: Negative for increased urination.  Genitourinary: Negative for painful urination.  Musculoskeletal: Positive for arthralgias, joint pain, joint swelling, myalgias, muscle weakness, morning stiffness, muscle tenderness and myalgias.  Skin: Negative for color change, rash and redness.  Allergic/Immunologic: Negative for susceptible to infections.  Neurological: Negative for dizziness, numbness, headaches, memory loss and weakness.  Hematological: Negative for swollen glands.  Psychiatric/Behavioral: Negative for confusion and sleep disturbance.    PMFS History:  Patient Active Problem List   Diagnosis Date Noted  . High risk medication use 02/13/2020  . Positive ANA (antinuclear  antibody) 12/21/2019  . 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/2019  . Lower extremity edema 12/21/2019  . 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 Diabetes mellitus ([place]) 07/11/2019  . Hypercholesterolemia 07/11/2019  . Hypertensive disorder 07/11/2019  . 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 Vision changes 04/04/2014    Past Medical History:  Diagnosis Date  . Breast cancer (HCC)    Right side  . Diabetes ([place])   . High cholesterol     Family History  Problem Relation Age of Onset  . Heart attack Mother   . Prostate cancer Father   . Heart attack Father   . Heart attack Son    Past Surgical History:  Procedure Laterality Date  . ABDOMINAL HYSTERECTOMY    . MASTECTOMY Right 1977  . OTHER SURGICAL HISTORY  1977   Right breast removed   . Tumor removed     Stomach   Social History   Social History Narrative   Lives at home alone.   Right handed.   Caffeine use: soft drinks (2-3 per day)   Immunization History  Administered Date(s) Administered  . Moderna Sars-Covid-2 Vaccination 02/15/2019, 03/15/2019, 12/22/2019     Objective: Vital Signs: BP 103/68 (BP Location: Left Arm, Patient Position: Sitting, Cuff Size: Normal)   Pulse 92   Ht 5\' 3"  (1.6 m)   Wt 140 lb 6.4 oz (63.7 kg)   BMI 24.87 kg/m    Physical Exam HENT:     Right Ear: External ear normal.     Left Ear: External ear normal.  Eyes:     Extraocular Movements: Extraocular  movements intact.     Conjunctiva/sclera: Conjunctivae normal.  Cardiovascular:     Rate and Rhythm: Normal rate and regular rhythm.  Pulmonary:     Effort: Pulmonary effort is normal.     Breath sounds: Normal breath sounds.  Skin:    General: Skin is warm and dry.     Comments: Forearm skin discoloration with perifollicular hyperpigmentation, no skin thickening or calcifications  Neurological:     General: No focal deficit present.     Mental Status: She is alert.      Musculoskeletal Exam:   Elbow, wrist, fingers full range of motion no tenderness or swelling Normal hip internal and external rotation without pain Knees patellofemoral crepitus with full ROM Ankles overlying pitting edema bilaterally no tenderness good ROM  Investigation: No additional findings.  Imaging: DG Chest 2 View  Result Date: 02/03/2020 CLINICAL DATA:  Pulmonary hypertension, diabetes mellitus, breast cancer post RIGHT mastectomy EXAM: CHEST - 2 VIEW COMPARISON:  01/30/2010 FINDINGS: Upper normal heart size. Mediastinal contours and pulmonary vascularity normal. Atherosclerotic calcification aorta. Minimal bibasilar atelectasis or scarring. Lungs otherwise clear. No acute infiltrate, pleural effusion or pneumothorax. Bones appear demineralized. Surgical clips in the mid abdomen. IMPRESSION: Minimal bibasilar atelectasis versus scarring. No acute abnormalities. Electronically Signed   By: Lavonia Dana M.D.   On: 02/03/2020 16:44    Recent Labs: Lab Results  Component Value Date   WBC 7.7 12/21/2019   HGB 9.9 (L) 12/21/2019   PLT 391 12/21/2019   NA 135 12/21/2019   K 4.4 12/21/2019   CL 102 12/21/2019   CO2 25 12/21/2019   GLUCOSE 195 (H) 12/21/2019   BUN 14 12/21/2019   CREATININE 1.02 (H) 12/21/2019   BILITOT 0.4 12/21/2019   ALKPHOS 73 04/04/2014   AST 16 12/21/2019   ALT 9 12/21/2019   PROT 5.8 (L) 12/21/2019   ALBUMIN 4.1 04/04/2014   CALCIUM 8.5 (L) 12/21/2019   GFRAA 60 12/21/2019    Speciality Comments: No specialty comments available.  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 Positive ANA with also anti-centromere Abs and the skin discoloration suggests possibly some overlap but lacks characteristic  criteria for SSc diagnosis. Due to the severe eye inflammation she developed would recommend some type of steroid sparing DMARD treatment. Low dose methotrexate most suitable with comorbidity profile, will need low dose especially while on maintenance PPI so 10mg  PO weekly. Will contact Dr. Romeo Apple office to confirm plan or other recommendations or concerns.  High risk medication use - Plan: Hepatitis B core antibody, IgM, Hepatitis B surface antigen, Hepatitis C antibody  Methotrexate carries risks of toxicity with liver injury or cytopenias requiring regular lab monitoring currently has some anemia but no history of leukopenia. Also checking baseline hepatitis serology and had CXR without evidence for ILD so no particular risk factors.  Orders: Orders Placed This Encounter  Procedures  . Hepatitis B core antibody, IgM  . Hepatitis B surface antigen  . Hepatitis C antibody   No orders of the defined types were placed in this encounter.   Follow-Up Instructions: Return in about 3 months (around 05/12/2020).   Collier Salina, MD  Note - This record has been created using [employer].  Chart creation errors have been sought, but may  not always  have been located. Such creation errors do not reflect on  the standard of medical care.  

## 2020-02-13 ENCOUNTER — Other Ambulatory Visit: Payer: Self-pay

## 2020-02-13 ENCOUNTER — Ambulatory Visit (INDEPENDENT_AMBULATORY_CARE_PROVIDER_SITE_OTHER): Payer: Medicare (Managed Care) | Admitting: Internal Medicine

## 2020-02-13 ENCOUNTER — Encounter: Payer: Self-pay | Admitting: Internal Medicine

## 2020-02-13 VITALS — BP 103/68 | HR 92 | Ht 63.0 in | Wt 140.4 lb

## 2020-02-13 DIAGNOSIS — H16003 Unspecified corneal ulcer, bilateral: Secondary | ICD-10-CM

## 2020-02-13 DIAGNOSIS — Z79899 Other long term (current) drug therapy: Secondary | ICD-10-CM

## 2020-02-13 DIAGNOSIS — R768 Other specified abnormal immunological findings in serum: Secondary | ICD-10-CM | POA: Diagnosis not present

## 2020-02-13 DIAGNOSIS — M3501 Sicca syndrome with keratoconjunctivitis: Secondary | ICD-10-CM | POA: Diagnosis not present

## 2020-02-13 DIAGNOSIS — R7689 Other specified abnormal immunological findings in serum: Secondary | ICD-10-CM

## 2020-02-13 NOTE — Patient Instructions (Signed)
I am checking blood tests for viral hepatitis to make sure this is not a problem before starting new medication. For your eye inflammation and skin changes methotrexate may be beneficial to prevent recurrence of severe inflammation. We will confirm plan with your regular doctors and Dr. Valetta Close, possibly starting medication once weekly and would need to follow up every 3 months if taking it.   Methotrexate tablets What is this medicine? METHOTREXATE (METH oh TREX ate) is a chemotherapy drug used to treat cancer including breast cancer, leukemia, and lymphoma. This medicine can also be used to treat psoriasis and certain kinds of arthritis. This medicine may be used for other purposes; ask your health care provider or pharmacist if you have questions. COMMON BRAND NAME(S): Rheumatrex, Trexall What should I tell my health care provider before I take this medicine? They need to know if you have any of these conditions:  fluid in the stomach area or lungs  if you often drink alcohol  infection or immune system problems  kidney disease or on hemodialysis  liver disease  low blood counts, like low white cell, platelet, or red cell counts  lung disease  radiation therapy  stomach ulcers  ulcerative colitis  an unusual or allergic reaction to methotrexate, other medicines, foods, dyes, or preservatives  pregnant or trying to get pregnant  breast-feeding How should I use this medicine? Take this medicine by mouth with a glass of water. Follow the directions on the prescription label. Take your medicine at regular intervals. Do not take it more often than directed. Do not stop taking except on your doctor's advice. Make sure you know why you are taking this medicine and how often you should take it. If this medicine is used for a condition that is not cancer, like arthritis or psoriasis, it should be taken weekly, NOT daily. Taking this medicine more often than directed can cause serious  side effects, even death. Talk to your healthcare provider about safe handling and disposal of this medicine. You may need to take special precautions. Talk to your pediatrician regarding the use of this medicine in children. While this drug may be prescribed for selected conditions, precautions do apply. Overdosage: If you think you have taken too much of this medicine contact a poison control center or emergency room at once. NOTE: This medicine is only for you. Do not share this medicine with others. What if I miss a dose? If you miss a dose, talk with your doctor or health care professional. Do not take double or extra doses. What may interact with this medicine? Do not take this medicine with any of the following medications:  acitretin This medicine may also interact with the following medication:  aspirin and aspirin-like medicines including salicylates  azathioprine  certain antibiotics like penicillins, tetracycline, and chloramphenicol  certain medicines that treat or prevent blood clots like warfarin, apixaban, dabigatran, and rivaroxaban  certain medicines for stomach problems like esomeprazole, omeprazole, pantoprazole  cyclosporine  dapsone  diuretics  gold  hydroxychloroquine  live virus vaccines  medicines for infection like acyclovir, adefovir, amphotericin B, bacitracin, cidofovir, foscarnet, ganciclovir, gentamicin, pentamidine, vancomycin  mercaptopurine  NSAIDs, medicines for pain and inflammation, like ibuprofen or naproxen  other cytotoxic agents  pamidronate  pemetrexed  penicillamine  phenylbutazone  phenytoin  probenecid  pyrimethamine  retinoids such as isotretinoin and tretinoin  steroid medicines like prednisone or cortisone  sulfonamides like sulfasalazine and trimethoprim/sulfamethoxazole  theophylline  zoledronic acid This list may not describe all  possible interactions. Give your health care provider a list of all the  medicines, herbs, non-prescription drugs, or dietary supplements you use. Also tell them if you smoke, drink alcohol, or use illegal drugs. Some items may interact with your medicine. What should I watch for while using this medicine? Avoid alcoholic drinks. This medicine can make you more sensitive to the sun. Keep out of the sun. If you cannot avoid being in the sun, wear protective clothing and use sunscreen. Do not use sun lamps or tanning beds/booths. You may need blood work done while you are taking this medicine. Call your doctor or health care professional for advice if you get a fever, chills or sore throat, or other symptoms of a cold or flu. Do not treat yourself. This drug decreases your body's ability to fight infections. Try to avoid being around people who are sick. This medicine may increase your risk to bruise or bleed. Call your doctor or health care professional if you notice any unusual bleeding. Be careful brushing or flossing your teeth or using a toothpick because you may get an infection or bleed more easily. If you have any dental work done, tell your dentist you are receiving this medicine. Check with your doctor or health care professional if you get an attack of severe diarrhea, nausea and vomiting, or if you sweat a lot. The loss of too much body fluid can make it dangerous for you to take this medicine. Talk to your doctor about your risk of cancer. You may be more at risk for certain types of cancers if you take this medicine. Do not become pregnant while taking this medicine or for 6 months after stopping it. Women should inform their health care provider if they wish to become pregnant or think they might be pregnant. Men should not father a child while taking this medicine and for 3 months after stopping it. There is potential for serious harm to an unborn child. Talk to your health care provider for more information. Do not breast-feed an infant while taking this medicine  or for 1 week after stopping it. This medicine may make it more difficult to get pregnant or father a child. Talk to your health care provider if you are concerned about your fertility. What side effects may I notice from receiving this medicine? Side effects that you should report to your doctor or health care professional as soon as possible:  allergic reactions like skin rash, itching or hives, swelling of the face, lips, or tongue  breathing problems or shortness of breath  diarrhea  dry, nonproductive cough  low blood counts - this medicine may decrease the number of white blood cells, red blood cells and platelets. You may be at increased risk for infections and bleeding.  mouth sores  redness, blistering, peeling or loosening of the skin, including inside the mouth  signs of infection - fever or chills, cough, sore throat, pain or trouble passing urine  signs and symptoms of bleeding such as bloody or black, tarry stools; red or dark-brown urine; spitting up blood or brown material that looks like coffee grounds; red spots on the skin; unusual bruising or bleeding from the eye, gums, or nose  signs and symptoms of kidney injury like trouble passing urine or change in the amount of urine  signs and symptoms of liver injury like dark yellow or brown urine; general ill feeling or flu-like symptoms; light-colored stools; loss of appetite; nausea; right upper belly pain; unusually weak  or tired; yellowing of the eyes or skin Side effects that usually do not require medical attention (report to your doctor or health care professional if they continue or are bothersome):  dizziness  hair loss  tiredness  upset stomach  vomiting This list may not describe all possible side effects. Call your doctor for medical advice about side effects. You may report side effects to FDA at 1-800-FDA-1088. Where should I keep my medicine? Keep out of the reach of children and pets. Store at  room temperature between 20 and 25 degrees C (68 and 77 degrees F). Protect from light. Get rid of any unused medicine after the expiration date. Talk to your health care provider about how to dispose of unused medicine. Special directions may apply.

## 2020-02-14 LAB — HEPATITIS B CORE ANTIBODY, IGM: Hep B C IgM: NONREACTIVE

## 2020-02-14 LAB — HEPATITIS C ANTIBODY
Hepatitis C Ab: NONREACTIVE
SIGNAL TO CUT-OFF: 0.01 (ref <1.00)

## 2020-02-14 LAB — HEPATITIS B SURFACE ANTIGEN: Hepatitis B Surface Ag: NONREACTIVE

## 2020-02-17 MED ORDER — METHOTREXATE 2.5 MG PO TABS: 10.0000 mg | ORAL_TABLET | ORAL | 0 refills | Status: DC

## 2020-02-17 MED ORDER — FOLIC ACID 1 MG PO TABS: 1.0000 mg | ORAL_TABLET | Freq: Every day | ORAL | 0 refills | Status: DC

## 2020-02-17 NOTE — Progress Notes (Signed)
Lab tests are normal no hepatitis infection. Recommend starting methotrexate 10mg  (4 tablets) once weekly and take folic acid 1 mg daily. We should follow up in 3-4 weeks after starting medication for check labs and make sure no side effect problems.

## 2020-02-17 NOTE — Addendum Note (Signed)
Addended by: Collier Salina on: 02/17/2020 01:42 PM   Modules accepted: Orders

## 2020-03-20 NOTE — Progress Notes (Signed)
Office Visit Note  Patient: Terry Alexander             Date of Birth: July 21, 1940           MRN: [id number]             PCP: [facility] [place] Referring: [place] Visit Date: 03/21/2020   Subjective:  Follow-up (Patient's symptoms seem to be improved. )   History of Present Illness: Terry Alexander is a 80 y.o. female here for follow up of sjogren's syndrome with keratoconjunctivitis and complicated by corneal melt after starting methotrexate 10 mg about 5 weeks ago. She feels that her symptoms are doing well overall. She has a bit more energy and she has no new problems with eye pain or redness. She has not noticed any new diarrhea, rash, cough, or ulcers after starting the methotrexate. She has swelling in her lower legs no joint swelling.     Review of Systems  Constitutional: Negative for fatigue.  HENT: Negative for mouth sores, mouth dryness and nose dryness.   Eyes: Negative for pain, itching, visual disturbance and dryness.  Respiratory: Negative for cough, hemoptysis, shortness of breath and difficulty breathing.   Cardiovascular: Positive for swelling in legs/feet. Negative for chest pain and palpitations.  Gastrointestinal: Positive for constipation. Negative for abdominal pain, blood in stool and diarrhea.  Endocrine: Negative for increased urination.  Genitourinary: Negative for painful urination.  Musculoskeletal: Negative for arthralgias, joint pain, joint swelling, myalgias, muscle weakness, morning stiffness, muscle tenderness and myalgias.  Skin: Negative for color change, rash and redness.  Allergic/Immunologic: Negative for susceptible to infections.  Neurological: Negative for dizziness, numbness, headaches, memory loss and weakness.  Hematological: Negative for swollen glands.  Psychiatric/Behavioral: Negative for confusion and sleep disturbance.   Previous HPI:  Terry Alexander is a 80 y.o. 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 Since her last visit steroid eye drops were decreased to once daily with apparently good control of the inflammation. Otherwise she is feeling in about her usual health. Skin rash on the arms she thinks is about the same, leg swellign is slightly better than before.   PMFS History:  Patient Active Problem List   Diagnosis Date Noted  . High risk medication use 02/13/2020  . 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/2019  . Lower extremity edema 12/21/2019  . 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 Diabetes mellitus ([place]) 07/11/2019  . Hypercholesterolemia 07/11/2019  . Hypertensive disorder 07/11/2019  . 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 Vision changes 04/04/2014    Past Medical History:  Diagnosis Date  . Breast cancer (HCC)    Right side  . Diabetes ([place])   . High cholesterol     Family History  Problem Relation Age of Onset  . Heart attack Mother   . Prostate cancer Father   . Heart attack Father   . Heart attack Son    Past Surgical History:  Procedure Laterality Date  . ABDOMINAL HYSTERECTOMY    . MASTECTOMY Right 1977  . OTHER SURGICAL HISTORY  1977   Right breast removed   . Tumor removed     Stomach   Social History   Social History Narrative   Lives at home alone.   Right handed.   Caffeine use: soft drinks (2-3 per day)   Immunization History  Administered Date(s) Administered  . Moderna Sars-Covid-2 Vaccination 02/15/2019, 03/15/2019, 12/22/2019     Objective: Vital Signs: BP 131/83 (BP Location: Left Arm, Patient Position: Sitting, Cuff Size: Normal)   Pulse 78   Ht 5\' 3"  (1.6 m)   Wt 138 lb 9.6 oz (62.9 kg)   BMI 24.55 kg/m    Physical Exam Eyes:     Conjunctiva/sclera: Conjunctivae normal.     Comments: Ptosis b/l  Skin:    General: Skin is warm and dry.     Comments: Irregular patches of  hyperpigmented skin on forearms b/l 1+ pitting edema in lower legs above ankles  Neurological:     General: No focal deficit present.     Mental Status: She is alert.  Psychiatric:        Mood and Affect: Mood normal.     Musculoskeletal Exam:  Elbows full ROM no tenderness or swelling Wrists full ROM no tenderness or swelling Fingers full ROM no tenderness or swelling Knees full ROM no tenderness or swelling, patellofemoral crepitus present     Investigation: No additional findings.  Imaging: No results found.  Recent Labs: Lab Results  Component Value Date   WBC 7.7 12/21/2019   HGB 9.9 (L) 12/21/2019   PLT 391 12/21/2019   NA 135 12/21/2019   K 4.4 12/21/2019   CL 102 12/21/2019   CO2 25 12/21/2019   GLUCOSE 195 (H) 12/21/2019   BUN 14 12/21/2019   CREATININE 1.02 (H) 12/21/2019   BILITOT 0.4 12/21/2019   ALKPHOS 73 04/04/2014   AST 16 12/21/2019   ALT 9 12/21/2019   PROT 5.8 (L) 12/21/2019   ALBUMIN 4.1 04/04/2014   CALCIUM 8.5 (L) 12/21/2019   GFRAA 60 12/21/2019    Speciality Comments: No specialty comments available.  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 Plan to continue at 10mg  dose weekly if normal lab results, effective dose may be altered by concurrent PPI use.  High risk medication use - Plan: CBC with Differential/Platelet, COMPLETE METABOLIC PANEL WITH GFR  Methotrexate requires monitoring for liver toxicity or cytopenias, and she has history of leukopenia in the past. Checking CBC, CMP today. Continue taking folic acid 1mg  daily. She does not describe any new rash, fever,  cough, or dyspnea.  Corneal melting of both eyes  Eye inflammation is apparently well controlled from last ophthalmology visit. Currently using topical drops and now on MTX as well.  Orders: Orders Placed This Encounter  Procedures  . CBC with Differential/Platelet  . COMPLETE METABOLIC PANEL WITH GFR   No orders of the defined types were placed in this encounter.    Follow-Up Instructions: Return in about 3 months (around 06/21/2020) for pSS on MTX f/u.   Collier Salina, MD  Note - This record has been created using [employer].  Chart creation errors have been sought, but may not always  have been located. Such creation errors do not reflect on  the standard of medical care.

## 2020-03-21 ENCOUNTER — Ambulatory Visit (INDEPENDENT_AMBULATORY_CARE_PROVIDER_SITE_OTHER): Payer: Medicare (Managed Care) | Admitting: Internal Medicine

## 2020-03-21 ENCOUNTER — Encounter: Payer: Self-pay | Admitting: Internal Medicine

## 2020-03-21 ENCOUNTER — Other Ambulatory Visit: Payer: Self-pay

## 2020-03-21 VITALS — BP 131/83 | HR 78 | Ht 63.0 in | Wt 138.6 lb

## 2020-03-21 DIAGNOSIS — H16003 Unspecified corneal ulcer, bilateral: Secondary | ICD-10-CM

## 2020-03-21 DIAGNOSIS — M3501 Sicca syndrome with keratoconjunctivitis: Secondary | ICD-10-CM

## 2020-03-21 DIAGNOSIS — Z79899 Other long term (current) drug therapy: Secondary | ICD-10-CM

## 2020-03-21 NOTE — Patient Instructions (Signed)
Complete Blood Count Why am I having this test? A complete blood count (CBC) is a blood test that measures the cells of your blood. The types of cells are red blood cells (RBCs), white blood cells (WBCs), and blood cell fragments that help with clotting (platelets). Changes in these cells can warn your health care provider about many conditions, including anemia, infection, inflammation, bleeding, and blood-related cancer. You may have this test:  As part of a routine physical exam.  To help your health care provider diagnose certain health conditions.  To help your health care provider monitor known health conditions or treatments. What is being tested? A CBC measures your RBCs, WBCs, platelets, and their different components. RBCs are made in the tissue inside your bones (bone marrow) and released into your blood. These cells contain a protein that carries oxygen in your blood (hemoglobin). CBC testing of RBCs includes:  RBC count. This is the total number of RBCs.  Hemoglobin (Hb). This is the total amount of hemoglobin.  Hematocrit (Hct). This is the percentage of space that red blood cells take up in your blood.  Mean corpuscular volume (MCV). This is the average size of red blood cells.  Mean corpuscular hemoglobin [facility]). This is the average amount of hemoglobin inside each red blood cell.  Mean corpuscular hemoglobin concentration (MCHC). This is the average concentration of hemoglobin inside each red blood cell.  Red blood cell distribution width (RDW). This may be included to measure the variation in the size of red blood cells. WBCs are also made in your bone marrow. They are part of your body's disease-fighting system (immune system). CBC testing of WBCs includes:  WBC count. This is the total number of WBCs.  A count of the five types of WBCs: ? Neutrophils. ? Lymphocytes. ? Monocytes. ? Eosinophils. ? Basophils. Platelets are cell fragments that are important for  blood clotting. A CBC measures:  Total platelet count.  Mean platelet volume (MPV). What kind of sample is taken? A blood sample is required for this test. It is usually collected by inserting a needle into a blood vessel.   How are the results reported? Your test results will be reported as values. Your health care provider will compare your results to normal ranges that were established after testing a large group of people (reference ranges). Reference ranges may vary among labs and hospitals. Reference ranges for a CBC usually apply only to people who are older than 18. For this test, common reference ranges for people older than 18 may be: Red blood cells  RBC count ? Men: 4.7-6.1 ? Women: 4.2-5.4  Hb ? Men: 51-18 ? Women: 12-16  Hct ? Men: 42-52% ? Women: 37-47%  MCV: 80-95  MCH: 27-31  MCHC: 32-36  RDW: 11-14.5% White blood cells  WBC count: 5,000-10,000  Neutrophil count: 2500-8000 or 55-70%  Lymphocyte count: 1000-4000 or 20-40%  Monocyte count: 100-700 or 2-8%  Eosinophil count: 50-500 or 1-4%  Basophil count: 25-100 or 0.5-1% Platelets  Platelet count: 150,000-400,000  MPV: 7.4-10.4 What do the results mean? Results that are outside the reference ranges may indicate that you have an infection or other health condition. Red blood cells  RBC count, Hb, and Hct ? Results lower than the reference ranges may indicate anemia. Anemia may be caused by several conditions, such as iron deficiency anemia. ? Results higher than the reference ranges may indicate polycythemia. This may be caused by several conditions, such as chronic obstructive pulmonary disease (  COPD).  MCV, MCH, MCHC, and RDW ? Results outside the reference ranges may indicate a condition that causes anemia. White blood cells  WBC count ? Results lower than the reference range may indicate an infection or a condition that keeps your bone marrow from making new WBCs. ? Results higher than  the reference range may indicate an infection, a condition that causes inflammation (autoimmune disease), or a blood-related cancer.  Neutrophils, lymphocytes, and monocytes ? Results outside the reference ranges may indicate an infection, an autoimmune disease, or certain types of cancer.  Eosinophils and basophils ? Results outside the reference ranges may indicate an allergy, an inflammatory condition such as asthma, or blood cell cancer. Platelets  Platelet count ? Results lower than the reference range may indicate an infection or blood cell cancer. ? Results higher than the reference range may indicate certain types of anemia, an autoimmune disease, or certain cancers.  MPV ? High or low results may indicate a bone marrow abnormality. Talk with your health care provider about what your results mean. Questions to ask your health care provider Ask your health care provider or the department that is doing the test:  When will my results be ready?  How will I get my results?  What are my treatment options?  What other tests do I need?  What are my next steps? Summary  A CBC is a routine blood test that measures the cells in your blood.  Changes in these cells can indicate many conditions, including anemia, infection, inflammation, and blood cell cancer.  A health care provider will collect a sample of your blood for this test by inserting a needle into a blood vessel.  Talk with your health care provider about what your results mean.    Comprehensive Metabolic Panel Why am I having this test? Your health care provider may order the comprehensive metabolic panel (CMP) to check the level of numerous substances in your blood. These include minerals in your body (electrolytes). It also checks the balance of body fluids (hydration or dehydration). The test results can also help tell you and your health care provider how well your liver and kidneys are working. What is being  tested? The comprehensive metabolic panel measures the levels of the following substances in your blood:  Glucose. Glucose is a simple sugar that serves as the main source of energy for your body.  Creatinine. Creatinine is a waste product of normal muscle activity. It is excreted from the body by the kidneys.  Blood urea nitrogen (BUN). Urea nitrogen is a waste product of protein breakdown. It is produced when excess protein in your body is broken down and used for energy. It is excreted by the kidneys.  Electrolytes. Electrolytes are negatively or positively charged particles that are dissolved in the water of different body compartments. This includes the serum portion of blood, water inside cells, and water outside cells. Concentrations of electrolytes vary among the different fluid compartments. Electrolytes are tightly regulated to maintain a salt-water and acid-base balance in the body. The electrolytes include: ? Potassium (K+). ? Sodium (Na+). ? Chloride (Cl-). ? Calcium (Ca). ? Bicarbonate (HCO3-).  Alkaline phosphatase (ALP). This is a protein that is found in all tissues of your body. The bones, liver, and bile ducts have the highest amounts.  Alanine aminotransferase (ALT). ALT is an enzyme found throughout the body. The highest concentrations of ALT are in the tissues of the liver. If your liver is injured, there will also  be ALT in your bloodstream.  Aspartate aminotransferase (AST). This is another enzyme found mostly in your liver. There is also a high concentration in your muscle cells and heart. Testing for AST helps check for liver damage.  Bilirubin. As the liver breaks down red blood cells, it produces a waste product called bilirubin. A high level of bilirubin can indicate certain health problems.  Albumin. This is a protein that is a major component of the liquid part of blood (serum). It is made by the liver and can be measured in the bloodstream.  Total protein.  This is a measurement of the amount of the two types of protein found in blood serum--albumin and globulins. Globulins are part of your body's defense (immune) system. What kind of sample is taken? A blood sample is required for this test. It is usually collected by inserting a needle into a blood vessel.   How do I prepare for this test? Your health care provider may ask you not to eat or drink anything for 6-8 hours before your blood sample is taken. Follow instructions from your health care provider about eating and drinking. How are the results reported? Your test results will be reported as values. Your health care provider will compare your results to normal ranges that were established after testing a large group of people (reference ranges). Reference ranges may vary among labs and hospitals. For this test, common reference ranges are: Glucose  Umbilical cord: 16-10 mg/dL or 2.5-5.3 mmol/L (SI units).  Premature infant: 20-60 mg/dL or 1.1-3.3 mmol/L.  Neonate: 30-60 mg/dL or 1.7-3.3 mmol/L.  Infant: 40-90 mg/dL or 2.2-5.0 mmol/L.  Child younger than 2 years: 60-100 mg/dL or 3.3-5.5 mmol/L.  Adult or child older than 2 years: ? Fasting: 70-110 mg/dL or less than 6.1 mmol/L. ? Random (non-fasting): less than or equal to 200 mg/dL or less than 11.1 mmol/L. Creatinine  Child younger than 2 years: 0.1-0.4 mg/dL.  Child 51-24 years old: 0.2-0.5 mg/dL.  Child 31-58 years old: 0.3-0.6 mg/dL.  Child or adolescent 66-24 years old: 0.4-1.0 mg/dL.  Adult 88-56 years old: ? Female: 0.5-1.0 mg/dL. ? Female: 0.6-1.2 mg/dL.  Adult 57-63 years old: ? Female: 0.5-1.1 mg/dL. ? Female: 0.6-1.3 mg/dL.  Adult 93 years and older: ? Female: 0.5-1.2 mg/dL. ? Female: 0.7-1.3 mg/dL. BUN  Umbilical cord: 96-04 mg/dL.  Newborn: 3-12 mg/dL.  Infant: 5-18 mg/dL.  Child: 5-18 mg/dL.  Adult: 10-20 mg/dL or 3.6-7.1 mmol/L (SI units). Potassium (K+)  Newborn: 3.9-5.9 mEq/L.  Infant: 4.1-5.3  mEq/L.  Child: 3.4-4.7 mEq/L.  Adult or elderly: 3.5-5.0 mEq/L or 3.5-5.0 mmol/L (SI units). Sodium (Na+)  Newborn: 134-144 mEq/L.  Infant: 134-150 mEq/L.  Child: 136-145 mEq/L.  Adult or elderly: 136-145 mEq/L or 136-145 mmol/L (SI units). Chloride (Cl-)  Premature infant: 95-110 mEq/L.  Newborn: 96-106 mEq/L.  Child: 90-110 mEq/L.  Adult or elderly: 98-106 mEq/L or 98-106 mmol/L (SI units). Calcium (Ca)  Total calcium: ? Umbilical cord: 5.4-09.8 mg/dL or 2.25-2.88 mmol/L. ? Newborn less than 69 days old: 7.6-10.4 mg/dL or 1.9-2.60 mmol/L. ? Infant 75 days to 68 years old: 9.0-10.6 mg/dL or 2.3-2.65 mmol/L. ? Child: 8.8-10.8 mg/dL or 2.2-2.7 mmol/L. ? Adult: 9.0-10.5 mg/dL or 2.25-2.62 mmol/L.  Ionized calcium: ? Newborn: 4.20-5.58 mg/dL or 1.05-1.37 mmol/L. ? Child 2 months to 78 years old: 4.80-5.52 mg/dL or 1.20-1.38 mmol/L. ? Adult: 4.5-5.6 mg/dL or 1.05-1.30 mmol/L. Bicarbonate (HCO3-)  Newborn: 13-22 mEq/L.  Infant: 20-28 mEq/L.  Child: 20-28 mEq/L.  Adult or elderly: 23-30  mEq/L or 23-30 mmol/L (SI units). ALP  Child less than 2 years: 85-235 units/L.  16-52 years old: 65-210 units/L.  75-6 years old: 60-300 units/L.  33-16 years old: 30-200 units/L.  Adult: 30-120 units/L or 0.5-2.0 microkatals/L (SI units). ALT  Child or adult: 4-36 international units/L at 98.87F (37C) or 4-36 units/L (SI units). AST  Newborn 61-1 days old: 35-140 units/L.  Child less than 3 years: 15-60 units/L.  32-70 years old: 15-50 units/L.  28-72 years old: 10-50 units/L.  63-60 years old: 10-40 units/L.  Adult: 0-35 units/L or 0-0.58 microkatals/L (SI units). Bilirubin  Total bilirubin for newborn: 1.0-12.0 mg/dL or 17.1-205 micromoles/L (SI units).  Adult, elderly, or child: ? Total bilirubin: 0.3-1.0 mg/dL or 5.1-17 micromoles/L (SI units). ? Indirect bilirubin: 0.2-0.8 mg/dL or 3.4-12.0 micromoles/L (SI units). ? Direct bilirubin: 0.1-0.3 mg/dL or 1.7-5.1  micromoles/L (SI units). Albumin  Premature infant: 3.0-4.2 g/dL.  Newborn: 3.5-5.4 g/dL.  Infant: 4.4-5.4 g/dL.  Child: 4.0-5.9 g/dL.  Adult or elderly: 3.5-5.0 g/dL or 35-50 g/L (SI units). Total protein  Premature infant: 4.2-7.6 g/dL.  Newborn: 4.6-7.4 g/dL.  Infant: 6.0-6.7 g/dL.  Child: 6.2-8.0 g/dL.  Adult or elderly: 6.4-8.3 g/dL or 64-83 g/L (SI units). What do the results mean? Diet and levels of activity can influence your test results. Sometimes they can be the cause of values that are outside of normal limits. However, sometimes values outside of normal limits can indicate a medical condition. Possible results for this test are: Glucose  Abnormally high glucose level (hyperglycemia). Possible causes of this include: ? Prediabetes mellitus. ? Diabetes mellitus. ? Severe stress caused by surgery, stroke, trauma. ? Overactive thyroid gland. ? Pancreatitis or pancreatic cancer.  Abnormally low glucose level (hypoglycemia). Possible causes include: ? Underactive thyroid gland. ? Insulin-secreting tumors, also called insulinoma (rare). Creatinine  Abnormally high creatinine level. Possible causes include: ? Kidney failure. ? Overactive thyroid (hyperthyroidism). ? Abnormal production of growth hormone in the body (acromegaly or gigantism). ? Abnormal breakdown of muscle tissue (rhabdomyolysis). ? Early muscular dystrophy.  Abnormally low creatinine level. Possible causes include: ? Low muscle mass associated with malnutrition. ? Late-stage muscular dystrophy. BUN  Abnormally high BUN level. This generally means that your kidneys are not functioning normally, especially if levels are greater than 50 mg/dL.  Abnormally low BUN level. This may indicate malnutrition or liver failure. Potassium  Abnormally high potassium level (hyperkalemia). Possible causes include: ? Kidney disease. ? Massive destruction of red blood cells (hemolysis). ? Adrenal gland  failure (Addison's disease).  Abnormally low potassium level (hypokalemia). Possible causes include: ? Excessive levels of the hormone aldosterone (hyperaldosteronism). Sodium  Abnormally high sodium level (hypernatremia). This can be caused by: ? Dehydration. ? Urination due to low levels of antidiuretic hormone (diabetes insipidus). ? Hyperaldosteronism. ? Excessive levels of cortisol in the body (Cushing's syndrome).  Abnormally low sodium level (hyponatremia). This can be caused by: ? Congestive heart failure. ? Cirrhosis of the liver. ? Kidney failure. ? Syndrome of inappropriate antidiuretic hormone (SIADH). Chloride  Abnormally high chloride level (hyperchloremia). This can be caused by: ? Acute kidney failure. ? Diabetes insipidus. ? Prolonged diarrhea. ? Poisoning with aspirin or bromide.  Abnormally low chloride level (hypochloremia). This can be caused by: ? Prolonged vomiting. ? Acute adrenal gland failure (Addisonian crisis). ? Hyperaldosteronism. ? SIADH. Calcium  Abnormally high calcium level (hypercalcemia). Possible causes include: ? Excessive activity of the parathyroid glands (hyperparathyroidism). ? Certain cancers. ? Inflammation seen in sarcoidosis and tuberculosis.  Abnormally  low calcium level (hypocalcemia). Possible causes include: ? Underactive parathyroid glands (hypoparathyroidism). ? Vitamin D deficiency. ? Acute pancreatitis. Bicarbonate  Abnormally high bicarbonate level. Possible causes include: ? Prolonged vomiting and diuretic therapy, which lead to a decrease in the amount of acid in the body (metabolic alkalosis). ? Conditions that increase the amount of bicarbonate in the body, including:  Hyperaldosteronism.  Rare hereditary disorders that interfere with how your kidneys handle electrolytes, such as Bartter syndrome.  Abnormally low bicarbonate level. Possible causes include: ? Conditions that cause your body to produce too  much acid (metabolic acidosis). These include:  Uncontrolled diabetes mellitus.  Poisoning with aspirin, methanol, or antifreeze (ethylene glycol). ALP  Abnormally high level of ALP. This can be a sign of: ? Certain cancers or tumors. ? Liver disease. ? Hepatitis. ? Sarcoidosis. ? Rickets. ? Blocked bile duct. ? Bone problems such as a fracture.  Abnormally low level of ALP. This can be caused by: ? Malnutrition. ? Hypophosphatasia. ? Wilson's disease. ALT  Abnormally high level of ALT. This can indicate: ? Mononucleosis. ? Pancreatitis. ? Liver problems, such as hepatitis, cirrhosis, and liver cancer. AST  Abnormally high level of AST. This can indicate: ? Liver problems, such as hepatitis, cirrhosis, and liver cancer. ? Mononucleosis. ? Pancreatitis. ? Muscle trauma. Bilirubin  Abnormally high level of bilirubin. This may be caused by: ? Liver problems, such as cirrhosis, liver disease, and hepatitis. ? Problems with the bile ducts, pancreas, or gallbladder. Albumin  Abnormally high albumin level. This may be caused by: ? Dehydration. ? Eating a high-protein diet.  Abnormally low albumin level. This may be caused by: ? Eating a low-protein diet. ? Having had weight-loss surgery. ? Liver disease. ? Kidney disease. ? Crohn's disease. Total protein  Abnormally high total protein level. This may be caused by: ? Infections, such as hepatitis B, hepatitis C, or HIV. ? Multiple myeloma. ? Waldenstrm's disease.  Abnormally low total protein level. This may be caused by: ? Conditions such as malnutrition, severe burns, heavy bleeding, or liver disease. Talk with your health care provider about what your results mean. Questions to ask your health care provider Ask your health care provider, or the department that is doing the test:  When will my results be ready?  How will I get my results?  What are my treatment options?  What other tests do I  need?  What are my next steps? Summary  Your health care provider may order the comprehensive metabolic panel (CMP) to check the levels of numerous substances in your blood.  A blood sample is required for this test. It is usually collected by inserting a needle into a blood vessel.  Your health care provider may ask you to not eat or drink anything for 6-8 hours before your blood sample is taken.  Sometimes values outside of normal limits can indicate a medical condition. This information is not intended to replace advice given to you by your health care provider. Make sure you discuss any questions you have with your health care provider. Document Revised: 06/02/2019 Document Reviewed: 06/02/2019 Elsevier Patient Education  [place].

## 2020-03-22 LAB — COMPLETE METABOLIC PANEL WITH GFR
AG Ratio: 0.8 (calc) — ABNORMAL LOW (ref 1.0–2.5)
ALT: 11 U/L (ref 6–29)
AST: 31 U/L (ref 10–35)
Albumin: 2.8 g/dL — ABNORMAL LOW (ref 3.6–5.1)
Alkaline phosphatase (APISO): 84 U/L (ref 37–153)
BUN/Creatinine Ratio: 13 (calc) (ref 6–22)
BUN: 13 mg/dL (ref 7–25)
CO2: 21 mmol/L (ref 20–32)
Calcium: 8.5 mg/dL — ABNORMAL LOW (ref 8.6–10.4)
Chloride: 107 mmol/L (ref 98–110)
Creat: 0.98 mg/dL — ABNORMAL HIGH (ref 0.60–0.88)
GFR, Est African American: 63 mL/min/{1.73_m2} (ref >=60)
GFR, Est Non African American: 54 mL/min/{1.73_m2} — ABNORMAL LOW (ref >=60)
Globulin: 3.5 g/dL (calc) (ref 1.9–3.7)
Glucose, Bld: 136 mg/dL — ABNORMAL HIGH (ref 65–99)
Potassium: 4.9 mmol/L (ref 3.5–5.3)
Sodium: 135 mmol/L (ref 135–146)
Total Bilirubin: 0.5 mg/dL (ref 0.2–1.2)
Total Protein: 6.3 g/dL (ref 6.1–8.1)

## 2020-03-22 LAB — CBC WITH DIFFERENTIAL/PLATELET
Absolute Monocytes: 330 cells/uL (ref 200–950)
Basophils Absolute: 22 cells/uL (ref 0–200)
Basophils Relative: 0.5 %
Eosinophils Absolute: 101 cells/uL (ref 15–500)
Eosinophils Relative: 2.3 %
HCT: 29 % — ABNORMAL LOW (ref 35.0–45.0)
Hemoglobin: 9.7 g/dL — ABNORMAL LOW (ref 11.7–15.5)
Lymphs Abs: 1280 cells/uL (ref 850–3900)
MCH: 33.7 pg — ABNORMAL HIGH (ref 27.0–33.0)
MCHC: 33.4 g/dL (ref 32.0–36.0)
MCV: 100.7 fL — ABNORMAL HIGH (ref 80.0–100.0)
MPV: 10.5 fL (ref 7.5–12.5)
Monocytes Relative: 7.5 %
Neutro Abs: 2666 cells/uL (ref 1500–7800)
Neutrophils Relative %: 60.6 %
Platelets: 263 10*3/uL (ref 140–400)
RBC: 2.88 10*6/uL — ABNORMAL LOW (ref 3.80–5.10)
RDW: 12.5 % (ref 11.0–15.0)
Total Lymphocyte: 29.1 %
WBC: 4.4 10*3/uL (ref 3.8–10.8)

## 2020-03-23 ENCOUNTER — Other Ambulatory Visit: Payer: Self-pay | Admitting: Internal Medicine

## 2020-03-23 DIAGNOSIS — H16003 Unspecified corneal ulcer, bilateral: Secondary | ICD-10-CM

## 2020-03-23 DIAGNOSIS — M3501 Sicca syndrome with keratoconjunctivitis: Secondary | ICD-10-CM

## 2020-03-23 MED ORDER — FOLIC ACID 1 MG PO TABS: 2.0000 mg | ORAL_TABLET | Freq: Every day | ORAL | 0 refills | Status: DC

## 2020-03-23 MED ORDER — METHOTREXATE 2.5 MG PO TABS: 10.0000 mg | ORAL_TABLET | ORAL | 0 refills | Status: DC

## 2020-03-23 NOTE — Progress Notes (Signed)
Her blood count is slightly decreased with increase in blood cell size, this can be a side effect of the methotrexate suppressing folic acid function in the body. She should increase the folic acid supplement to 2 mg daily instead of the 1 mg daily. Otherwise I believe okay to continue the current methotrexate dose.

## 2020-03-23 NOTE — Addendum Note (Signed)
Addended by: Collier Salina on: 03/23/2020 02:19 PM   Modules accepted: Orders

## 2020-06-19 NOTE — Progress Notes (Signed)
Office Visit Note  Patient: Terry Alexander             Date of Birth: [birth year]           MRN: [id number]             PCP: [facility] [place] Referring: [place] Visit Date: 06/20/2020   Subjective:  Follow-up (Patient feels as if symptoms are well controlled with MTX. )   History of Present Illness: Terry Alexander is a 80 y.o. 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 weekly.  Overall she feels her symptoms are doing well.  She has noticed increased symptoms with eye dryness with irritated feeling and eye being matted down and dry.  She uses drops intermittently feels like previously treating with gel or ointment was more beneficial than drops.   Review of Systems  Constitutional: Negative for fatigue.  HENT: Positive for mouth sores, mouth dryness and nose dryness.   Eyes: Positive for dryness. Negative for pain, itching and visual disturbance.  Respiratory: Negative for cough, hemoptysis, shortness of breath and difficulty breathing.   Cardiovascular: Negative for chest pain, palpitations and swelling in legs/feet.  Gastrointestinal: Positive for constipation. Negative for abdominal pain, blood in stool and diarrhea.  Endocrine: Negative for increased urination.  Genitourinary: Negative for painful urination.  Musculoskeletal: Negative for arthralgias, joint pain, joint swelling, myalgias, muscle weakness, morning stiffness, muscle tenderness and myalgias.  Skin: Positive for rash. Negative for color change and redness.  Allergic/Immunologic: Negative for susceptible to infections.  Neurological: Negative for dizziness, numbness, headaches, memory loss and weakness.  Hematological: Negative for swollen glands.  Psychiatric/Behavioral: Negative for confusion and sleep disturbance.    PMFS History:  Patient Active  Problem List   Diagnosis Date Noted  . High risk medication use 02/13/2020  . 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/2019  . Lower extremity edema 12/21/2019  . Sjogren syndrome with keratoconjunctivitis ([place]) 12/21/2019  . Diabetes mellitus ([place]) 07/11/2019  . Hypercholesterolemia 07/11/2019  . Hypertensive disorder 07/11/2019  . 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 Vision changes 04/04/2014    Past Medical History:  Diagnosis Date  . Breast cancer (HCC)    Right side  . Diabetes ([place])   . High cholesterol     Family History  Problem Relation Age of Onset  . Heart attack Mother   . Prostate cancer Father   . Heart attack Father   . Heart attack Son    Past Surgical History:  Procedure Laterality Date  . ABDOMINAL HYSTERECTOMY    . MASTECTOMY Right 1977  . OTHER SURGICAL HISTORY  1977   Right breast removed   . Tumor removed     Stomach   Social History   Social History Narrative   Lives at home alone.   Right handed.   Caffeine use: soft drinks (2-3 per day)   Immunization History  Administered Date(s) Administered  . Moderna Sars-Covid-2 Vaccination 02/15/2019, 03/15/2019, 12/22/2019     Objective: Vital Signs: BP 118/76 (BP Location: Left Arm, Patient Position: Sitting, Cuff Size: Normal)   Pulse 83   Ht 5\' 3"  (1.6 m)   Wt 133 lb 9.6 oz (60.6 kg)   BMI 23.67 kg/m    Physical Exam Eyes:     Comments: Bilateral mild conjunctival injection worse on left, left eye somewhat matted down  with only partial opening, no periorbital edema  Skin:    Comments: Perifollicular hyperpigmented patches on both forearms no erythema no skin changes over hands no digital pitting  Neurological:     Mental Status: She is alert.  Psychiatric:        Mood and Affect: Mood normal.      Musculoskeletal Exam:  Elbows full ROM no tenderness or swelling Wrists full ROM no tenderness or swelling Fingers full ROM no tenderness or swelling  Investigation: No additional  findings.  Imaging: No results found.  Recent Labs: Lab Results  Component Value Date   WBC 4.4 03/21/2020   HGB 9.7 (L) 03/21/2020   PLT 263 03/21/2020   NA 135 03/21/2020   K 4.9 03/21/2020   CL 107 03/21/2020   CO2 21 03/21/2020   GLUCOSE 136 (H) 03/21/2020   BUN 13 03/21/2020   CREATININE 0.98 (H) 03/21/2020   BILITOT 0.5 03/21/2020   ALKPHOS 73 04/04/2014   AST 31 03/21/2020   ALT 11 03/21/2020   PROT 6.3 03/21/2020   ALBUMIN 4.1 04/04/2014   CALCIUM 8.5 (L) 03/21/2020   GFRAA 63 03/21/2020    Speciality Comments: No specialty comments available.  Procedures:  No procedures performed Allergies: Codeine   Assessment / Plan:     Visit Diagnoses: Sjogren syndrome with keratoconjunctivitis ([place]) Corneal melting of both eyes - Plan: methotrexate (RHEUMATREX) 2.5 MG tablet  Eye inflammation seems reasonably controlled today although there is a lot of injection this seems to be due to persistent dryness.  Discussed she has been using drops but would recommend addition of ophthalmic gel or ointment especially at nighttime to protect against micro abrasions and this kind of inflammation.  Discussed options such as Systane gel or GenTeal ointments available over-the-counter otherwise would ask ophthalmologist if these can't be used for some reason.  Plan to continue methotrexate 10 mg p.o. weekly.  High risk medication use - Plan: CBC with Differential/Platelet, COMPLETE METABOLIC PANEL WITH GFR  Methotrexate requires monitoring for cytopenia or hepatotoxicity checking CBC and CMP today.  Orders: Orders Placed This Encounter  Procedures  . CBC with Differential/Platelet  . COMPLETE METABOLIC PANEL WITH GFR  . Sedimentation rate   Meds ordered this encounter  Medications  . methotrexate (RHEUMATREX) 2.5 MG tablet    Sig: Take 4 tablets (10 mg total) by mouth once a week. Caution:Chemotherapy. Protect from light.    Dispense:  48 tablet    Refill:  0     Follow-Up  Instructions: Return in about 3 months (around 09/20/2020) for SS on MTX 27mos f/u.   Collier Salina, MD  Note - This record has been created using [employer].  Chart creation errors have been sought, but may not always  have been located. Such creation errors do not reflect on  the standard of medical care.

## 2020-06-20 ENCOUNTER — Encounter: Payer: Self-pay | Admitting: Internal Medicine

## 2020-06-20 ENCOUNTER — Ambulatory Visit (INDEPENDENT_AMBULATORY_CARE_PROVIDER_SITE_OTHER): Payer: Medicare (Managed Care) | Admitting: Internal Medicine

## 2020-06-20 ENCOUNTER — Other Ambulatory Visit: Payer: Self-pay

## 2020-06-20 VITALS — BP 118/76 | HR 83 | Ht 63.0 in | Wt 133.6 lb

## 2020-06-20 DIAGNOSIS — Z79899 Other long term (current) drug therapy: Secondary | ICD-10-CM

## 2020-06-20 DIAGNOSIS — M3501 Sicca syndrome with keratoconjunctivitis: Secondary | ICD-10-CM

## 2020-06-20 DIAGNOSIS — H16003 Unspecified corneal ulcer, bilateral: Secondary | ICD-10-CM

## 2020-06-20 MED ORDER — METHOTREXATE 2.5 MG PO TABS: 10.0000 mg | ORAL_TABLET | ORAL | 0 refills | Status: AC

## 2020-06-21 LAB — CBC WITH DIFFERENTIAL/PLATELET
Absolute Monocytes: 330 cells/uL (ref 200–950)
Basophils Absolute: 22 cells/uL (ref 0–200)
Basophils Relative: 0.5 %
Eosinophils Absolute: 150 cells/uL (ref 15–500)
Eosinophils Relative: 3.4 %
HCT: 28.8 % — ABNORMAL LOW (ref 35.0–45.0)
Hemoglobin: 9.3 g/dL — ABNORMAL LOW (ref 11.7–15.5)
Lymphs Abs: 1448 cells/uL (ref 850–3900)
MCH: 34.4 pg — ABNORMAL HIGH (ref 27.0–33.0)
MCHC: 32.3 g/dL (ref 32.0–36.0)
MCV: 106.7 fL — ABNORMAL HIGH (ref 80.0–100.0)
MPV: 10.8 fL (ref 7.5–12.5)
Monocytes Relative: 7.5 %
Neutro Abs: 2451 cells/uL (ref 1500–7800)
Neutrophils Relative %: 55.7 %
Platelets: 273 10*3/uL (ref 140–400)
RBC: 2.7 10*6/uL — ABNORMAL LOW (ref 3.80–5.10)
RDW: 13.4 % (ref 11.0–15.0)
Total Lymphocyte: 32.9 %
WBC: 4.4 10*3/uL (ref 3.8–10.8)

## 2020-06-21 LAB — COMPLETE METABOLIC PANEL WITH GFR
AG Ratio: 0.8 (calc) — ABNORMAL LOW (ref 1.0–2.5)
ALT: 10 U/L (ref 6–29)
AST: 26 U/L (ref 10–35)
Albumin: 3.1 g/dL — ABNORMAL LOW (ref 3.6–5.1)
Alkaline phosphatase (APISO): 100 U/L (ref 37–153)
BUN/Creatinine Ratio: 13 (calc) (ref 6–22)
BUN: 14 mg/dL (ref 7–25)
CO2: 23 mmol/L (ref 20–32)
Calcium: 8.8 mg/dL (ref 8.6–10.4)
Chloride: 103 mmol/L (ref 98–110)
Creat: 1.07 mg/dL — ABNORMAL HIGH (ref 0.60–0.88)
GFR, Est African American: 56 mL/min/{1.73_m2} — ABNORMAL LOW (ref >=60)
GFR, Est Non African American: 49 mL/min/{1.73_m2} — ABNORMAL LOW (ref >=60)
Globulin: 3.7 g/dL (calc) (ref 1.9–3.7)
Glucose, Bld: 123 mg/dL — ABNORMAL HIGH (ref 65–99)
Potassium: 4.9 mmol/L (ref 3.5–5.3)
Sodium: 134 mmol/L — ABNORMAL LOW (ref 135–146)
Total Bilirubin: 0.5 mg/dL (ref 0.2–1.2)
Total Protein: 6.8 g/dL (ref 6.1–8.1)

## 2020-06-21 LAB — SEDIMENTATION RATE: Sed Rate: 22 mm/h (ref 0–30)

## 2020-06-24 NOTE — Progress Notes (Signed)
Lab test indicates a mild macrocytic anemia, which can be from the methotrexate. She is already on a very low dose. She should increase the folic acid supplement to 2 mg daily. If this gets worse in future follow up despite this change we will have to discuss changing the medication.

## 2020-06-25 ENCOUNTER — Other Ambulatory Visit: Payer: Self-pay | Admitting: Radiology

## 2020-06-25 DIAGNOSIS — Z79899 Other long term (current) drug therapy: Secondary | ICD-10-CM

## 2020-06-25 MED ORDER — FOLIC ACID 1 MG PO TABS: 2.0000 mg | ORAL_TABLET | Freq: Every day | ORAL | 2 refills | Status: AC

## 2020-06-25 NOTE — Telephone Encounter (Signed)
Spoke with patient, she is unsure whether or not she's been taking Folic Acid. Patient looked through her pill bottles and does not see Folic Acid. Patient will need a new Rx, should patient take 2 mg daily? Please check Rx and sign / send to pharmacy.

## 2020-08-01 ENCOUNTER — Telehealth: Payer: Self-pay | Admitting: Radiology

## 2020-08-01 NOTE — Telephone Encounter (Signed)
Spoke with [place], NP and she is concerned with weight loss and abnormal lab results and wondering if it could be related to Methotrexate. Marye Round is going to fax lab results and most recent note for Dr. Benjamine Mola to review.   Clemens Catholic, NP - [phone]

## 2020-08-01 NOTE — Telephone Encounter (Signed)
FYI- I spoke with Clemens Catholic review of labs shows mild macrocytic anemia also mild LFT elevations all of which are increased compared to last monitoring labs here. Recommended holding the medication at least 2 weeks and can repeat labs. Not sure if the weight loss is related. I believe her risk of recurrence of eye inflammation in the short term is not very high. She has follow up planned with [place] in September and can discuss whether needing to restart medicine at that time.

## 2020-08-21 ENCOUNTER — Encounter: Payer: Self-pay | Admitting: Internal Medicine

## 2020-09-19 ENCOUNTER — Encounter: Payer: Self-pay | Admitting: Internal Medicine

## 2020-09-19 ENCOUNTER — Ambulatory Visit (INDEPENDENT_AMBULATORY_CARE_PROVIDER_SITE_OTHER): Payer: Medicare (Managed Care) | Admitting: Internal Medicine

## 2020-09-19 ENCOUNTER — Other Ambulatory Visit: Payer: Self-pay

## 2020-09-19 VITALS — BP 105/68 | HR 79 | Resp 16 | Ht 63.0 in | Wt 125.0 lb

## 2020-09-19 DIAGNOSIS — M3501 Sicca syndrome with keratoconjunctivitis: Secondary | ICD-10-CM | POA: Diagnosis not present

## 2020-09-19 NOTE — Progress Notes (Signed)
Office Visit Note  Patient: Terry Alexander             Date of Birth: 1940/01/26           MRN: [id number]             PCP: [facility] [place] Referring: [place] Visit Date: 09/19/2020   Subjective:  Follow-up (Doing good)   History of Present Illness: FIRDAWS HARTZLER is a 80 y.o. female here for follow up for sjogren's syndrome after discontinuing methotrexate during the interval due to weight loss and lab abnormalities and subsequent improvement after stopping medication.  However since last visit she has worsening of the bilateral eye dryness and irritation with significant redness and swelling difficulty opening her eyes due to irritation.  She has not noticed significant changes to her skin or joints.  Previous HPI 06/20/20 AVALINE BIAN is a 80 y.o. 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 weekly.  Overall she feels her symptoms are doing well.  She has noticed increased symptoms with eye dryness with irritated feeling and eye being matted down and dry.  She uses drops intermittently feels like previously treating with gel or ointment was more beneficial than drops.  12/21/19 TYRESE WEEK is a 80 y.o. 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 Symptoms are now improving and she is continuing just ophthalmic treatment for this. During these past 2 months she also describes increased skin color changes on her arms, dry mouth, and since about 2 weeks ago increasing leg swelling which was evaluated in the ED that did not find any blood clot. Overall most of these symptoms are new although she has had dry eyes and mouth for years without major complications. She denies hair loss, lymphadenopathy, fevers, shortness of  breath, raynaud's phenomenon. She does report having some unintentional weight loss but that it is now improving.   Review of Systems  Constitutional:  Positive for fatigue.  HENT:  Positive for mouth dryness.   Eyes:  Positive for visual disturbance and dryness.  Respiratory:  Positive for shortness of breath.   Cardiovascular:  Positive for swelling in legs/feet.  Gastrointestinal:  Positive for constipation.  Endocrine: Positive for cold intolerance, excessive thirst and increased urination.  Genitourinary:  Negative for difficulty urinating.  Musculoskeletal:  Positive for gait problem.  Skin:  Positive for rash.  Allergic/Immunologic: Negative for susceptible to infections.  Neurological:  Negative for numbness.  Hematological:  Negative for bruising/bleeding tendency.  Psychiatric/Behavioral:  Negative for sleep disturbance.    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   Right handed.   Caffeine use: soft drinks (2-3 per day)   Immunization History   Administered Date(s) Administered   Moderna Sars-Covid-2 Vaccination 02/15/2019, 03/15/2019, 12/22/2019     Objective: Vital Signs: BP 105/68 (BP Location: Left Arm, Patient Position: Sitting, Cuff Size: Normal)   Pulse 79   Resp 16   Ht '5\' 3"'$  (1.6 m)   Wt 125 lb (56.7 kg)   BMI 22.14 kg/m    Physical Exam Eyes:     Comments: Difficulty maintaining eyes and open position bilaterally is worse on the left, severe erythema and swelling chemosis type changes in the whites of both eyes, movement intact and no periorbital edema denies any pain or tenderness  Cardiovascular:     Rate and Rhythm: Normal rate and regular rhythm.  Pulmonary:     Effort: Pulmonary effort is normal.     Breath sounds: Normal breath sounds.  Skin:    General: Skin is warm and dry.     Comments: Perifollicular hyperpigmented patches on both forearms no erythema no skin changes over hands no digital pitting       Musculoskeletal Exam:  Elbows full ROM no tenderness or swelling Wrists full ROM no tenderness or swelling Fingers full ROM no tenderness or swelling  Investigation: No additional findings.  Imaging: No results found.  Recent Labs: Lab Results  Component Value Date   WBC 4.4 06/20/2020   HGB 9.3 (L) 06/20/2020   PLT 273 06/20/2020   NA 134 (L) 06/20/2020   K 4.9 06/20/2020   CL 103 06/20/2020   CO2 23 06/20/2020   GLUCOSE 123 (H) 06/20/2020   BUN 14 06/20/2020   CREATININE 1.07 (H) 06/20/2020   BILITOT 0.5 06/20/2020   ALKPHOS 73 04/04/2014   AST 26 06/20/2020   ALT 10 06/20/2020   PROT 6.8 06/20/2020   ALBUMIN 4.1 04/04/2014   CALCIUM 8.8 06/20/2020   GFRAA 56 (L) 06/20/2020    Speciality Comments: No specialty comments available.  Procedures:  No procedures performed Allergies: Methotrexate derivatives and Codeine   Assessment / Plan:     Visit Diagnoses: Sjogren syndrome with keratoconjunctivitis ([place])  Eye inflammation appears significantly worse compared to her  last visit she is now off maintenance DMARD treatment.  The amount of ecchymosis and irritation does suggest a risk for recurrence of multiple ulceration or other abrasion.  Would not recommend starting Imuran as alternative to methotrexate at this time with still having ongoing cytopenias.  May need to resume systemic steroids in the short-term unless ophthalmologist thinks topical inflammatory treatment would be able to improve current activity.  She was seeing I think Dr. Valetta Close with [facility].  Orders: No orders of the defined types were placed in this encounter.  No orders of the defined types were placed in this encounter.    Follow-Up Instructions: Return in about 3 months (around 12/19/2020) for pSS eye chemosis f/u 23mo.   CCollier Salina MD  Note - This record has been created using [employer]  Chart creation errors have been sought, but may not always  have been located. Such creation errors do not reflect on  the standard of medical care.

## 2020-09-27 ENCOUNTER — Telehealth: Payer: Self-pay

## 2020-09-27 NOTE — Telephone Encounter (Signed)
I spoke with Clemens Catholic about her eye problem patient has been referred back to see oculoplastics for the ptosis with her current eye inflammation problems.  Based on the degree of chemosis and symptoms and her severe eye dryness with Sjogren's syndrome I recommended a limited course of systemic steroids.  She will order to start at 30 mg/day tapering down to 20 mg after 1 week then 10 mg then continue on 5 mg daily maintenance.  Recommended she move follow-up to 1 month from now to recheck response to treatment.

## 2020-09-27 NOTE — Telephone Encounter (Signed)
Contacted patient, she has been scheduled for 1 month f/u on 10/29/2020 at 1:40 pm.

## 2020-09-27 NOTE — Telephone Encounter (Signed)
Clemens Catholic from [facility] called stating Terry Alexander is having difficulty with her left eye and requested a return call to update her on the treatment plan.   [phone]

## 2020-10-16 ENCOUNTER — Telehealth: Payer: Self-pay

## 2020-10-16 NOTE — Telephone Encounter (Signed)
Clemens Catholic, nurse practitioner from [facility] left a voicemail to share patient's most recent opthalmology findings with Dr. Benjamine Mola or his nurse.  [phone]

## 2020-10-16 NOTE — Telephone Encounter (Signed)
FYI- I spoke with Clemens Catholic and reviewed ophthalmology notes. Current treatment plan described is for 14 days which is until our next follow up, will also plan to reach out and discuss any recommendations for immunomodulatory treatments as next step.

## 2020-10-23 ENCOUNTER — Observation Stay (HOSPITAL_COMMUNITY): Payer: Medicare (Managed Care)

## 2020-10-23 ENCOUNTER — Inpatient Hospital Stay (HOSPITAL_COMMUNITY)
Admission: EM | Admit: 2020-10-23 | Discharge: 2020-10-26 | DRG: 280 | Disposition: A | Discharge status: Skilled Nursing Facility | Payer: Medicare (Managed Care) | Attending: Internal Medicine | Admitting: Internal Medicine

## 2020-10-23 ENCOUNTER — Emergency Department (HOSPITAL_COMMUNITY): Payer: Medicare (Managed Care)

## 2020-10-23 DIAGNOSIS — I429 Cardiomyopathy, unspecified: Secondary | ICD-10-CM | POA: Diagnosis present

## 2020-10-23 DIAGNOSIS — Z885 Allergy status to narcotic agent status: Secondary | ICD-10-CM

## 2020-10-23 DIAGNOSIS — Z79899 Other long term (current) drug therapy: Secondary | ICD-10-CM

## 2020-10-23 DIAGNOSIS — E7800 Pure hypercholesterolemia, unspecified: Secondary | ICD-10-CM | POA: Diagnosis present

## 2020-10-23 DIAGNOSIS — I959 Hypotension, unspecified: Secondary | ICD-10-CM | POA: Diagnosis present

## 2020-10-23 DIAGNOSIS — I252 Old myocardial infarction: Secondary | ICD-10-CM

## 2020-10-23 DIAGNOSIS — I340 Nonrheumatic mitral (valve) insufficiency: Secondary | ICD-10-CM | POA: Diagnosis present

## 2020-10-23 DIAGNOSIS — R54 Age-related physical debility: Secondary | ICD-10-CM | POA: Diagnosis present

## 2020-10-23 DIAGNOSIS — F05 Delirium due to known physiological condition: Secondary | ICD-10-CM | POA: Diagnosis not present

## 2020-10-23 DIAGNOSIS — H16001 Unspecified corneal ulcer, right eye: Secondary | ICD-10-CM | POA: Diagnosis present

## 2020-10-23 DIAGNOSIS — R531 Weakness: Secondary | ICD-10-CM

## 2020-10-23 DIAGNOSIS — Z888 Allergy status to other drugs, medicaments and biological substances status: Secondary | ICD-10-CM

## 2020-10-23 DIAGNOSIS — M3500 Sicca syndrome, unspecified: Secondary | ICD-10-CM | POA: Diagnosis present

## 2020-10-23 DIAGNOSIS — E875 Hyperkalemia: Secondary | ICD-10-CM | POA: Diagnosis present

## 2020-10-23 DIAGNOSIS — Z20822 Contact with and (suspected) exposure to covid-19: Secondary | ICD-10-CM | POA: Diagnosis present

## 2020-10-23 DIAGNOSIS — Z6822 Body mass index (BMI) 22.0-22.9, adult: Secondary | ICD-10-CM

## 2020-10-23 DIAGNOSIS — I2510 Atherosclerotic heart disease of native coronary artery without angina pectoris: Secondary | ICD-10-CM | POA: Diagnosis present

## 2020-10-23 DIAGNOSIS — I10 Essential (primary) hypertension: Secondary | ICD-10-CM | POA: Diagnosis present

## 2020-10-23 DIAGNOSIS — H179 Unspecified corneal scar and opacity: Secondary | ICD-10-CM | POA: Diagnosis present

## 2020-10-23 DIAGNOSIS — E871 Hypo-osmolality and hyponatremia: Secondary | ICD-10-CM | POA: Diagnosis present

## 2020-10-23 DIAGNOSIS — Z9011 Acquired absence of right breast and nipple: Secondary | ICD-10-CM

## 2020-10-23 DIAGNOSIS — E860 Dehydration: Secondary | ICD-10-CM | POA: Diagnosis present

## 2020-10-23 DIAGNOSIS — H409 Unspecified glaucoma: Secondary | ICD-10-CM | POA: Diagnosis present

## 2020-10-23 DIAGNOSIS — I214 Non-ST elevation (NSTEMI) myocardial infarction: Principal | ICD-10-CM | POA: Diagnosis present

## 2020-10-23 DIAGNOSIS — E43 Unspecified severe protein-calorie malnutrition: Secondary | ICD-10-CM | POA: Diagnosis present

## 2020-10-23 DIAGNOSIS — L89612 Pressure ulcer of right heel, stage 2: Secondary | ICD-10-CM | POA: Diagnosis present

## 2020-10-23 DIAGNOSIS — H547 Unspecified visual loss: Secondary | ICD-10-CM | POA: Diagnosis present

## 2020-10-23 DIAGNOSIS — R339 Retention of urine, unspecified: Secondary | ICD-10-CM | POA: Diagnosis present

## 2020-10-23 DIAGNOSIS — Z87891 Personal history of nicotine dependence: Secondary | ICD-10-CM

## 2020-10-23 DIAGNOSIS — E1165 Type 2 diabetes mellitus with hyperglycemia: Secondary | ICD-10-CM | POA: Diagnosis present

## 2020-10-23 DIAGNOSIS — M3501 Sicca syndrome with keratoconjunctivitis: Secondary | ICD-10-CM | POA: Diagnosis present

## 2020-10-23 DIAGNOSIS — H16209 Unspecified keratoconjunctivitis, unspecified eye: Secondary | ICD-10-CM | POA: Diagnosis present

## 2020-10-23 DIAGNOSIS — Z853 Personal history of malignant neoplasm of breast: Secondary | ICD-10-CM

## 2020-10-23 DIAGNOSIS — Z7982 Long term (current) use of aspirin: Secondary | ICD-10-CM

## 2020-10-23 DIAGNOSIS — Z8249 Family history of ischemic heart disease and other diseases of the circulatory system: Secondary | ICD-10-CM

## 2020-10-23 DIAGNOSIS — H16002 Unspecified corneal ulcer, left eye: Secondary | ICD-10-CM | POA: Diagnosis present

## 2020-10-23 DIAGNOSIS — Z66 Do not resuscitate: Secondary | ICD-10-CM | POA: Diagnosis present

## 2020-10-23 DIAGNOSIS — R451 Restlessness and agitation: Secondary | ICD-10-CM | POA: Diagnosis present

## 2020-10-23 HISTORY — DX: Malignant neoplasm of stomach, unspecified: C16.9

## 2020-10-23 LAB — RESP PANEL BY RT-PCR (FLU A&B, COVID) ARPGX2
Influenza A by PCR: NEGATIVE
Influenza B by PCR: NEGATIVE
SARS Coronavirus 2 by RT PCR: NEGATIVE

## 2020-10-23 LAB — COMPREHENSIVE METABOLIC PANEL
ALT: 41 U/L (ref 0–44)
AST: 61 U/L — ABNORMAL HIGH (ref 15–41)
Albumin: 2.6 g/dL — ABNORMAL LOW (ref 3.5–5.0)
Alkaline Phosphatase: 103 U/L (ref 38–126)
Anion gap: 11 (ref 5–15)
BUN: 19 mg/dL (ref 8–23)
CO2: 18 mmol/L — ABNORMAL LOW (ref 22–32)
Calcium: 9 mg/dL (ref 8.9–10.3)
Chloride: 98 mmol/L (ref 98–111)
Creatinine, Ser: 1.07 mg/dL — ABNORMAL HIGH (ref 0.44–1.00)
GFR, Estimated: 52 mL/min — ABNORMAL LOW (ref >60)
Glucose, Bld: 209 mg/dL — ABNORMAL HIGH (ref 70–99)
Potassium: 5.4 mmol/L — ABNORMAL HIGH (ref 3.5–5.1)
Sodium: 127 mmol/L — ABNORMAL LOW (ref 135–145)
Total Bilirubin: 1.2 mg/dL (ref 0.3–1.2)
Total Protein: 6.2 g/dL — ABNORMAL LOW (ref 6.5–8.1)

## 2020-10-23 LAB — CBC WITH DIFFERENTIAL/PLATELET
Abs Immature Granulocytes: 0.06 10*3/uL (ref 0.00–0.07)
Basophils Absolute: 0 10*3/uL (ref 0.0–0.1)
Basophils Relative: 0 %
Eosinophils Absolute: 0 10*3/uL (ref 0.0–0.5)
Eosinophils Relative: 0 %
HCT: 35.9 % — ABNORMAL LOW (ref 36.0–46.0)
Hemoglobin: 12 g/dL (ref 12.0–15.0)
Immature Granulocytes: 1 %
Lymphocytes Relative: 20 %
Lymphs Abs: 2.2 10*3/uL (ref 0.7–4.0)
MCH: 33.7 pg (ref 26.0–34.0)
MCHC: 33.4 g/dL (ref 30.0–36.0)
MCV: 100.8 fL — ABNORMAL HIGH (ref 80.0–100.0)
Monocytes Absolute: 0.5 10*3/uL (ref 0.1–1.0)
Monocytes Relative: 5 %
Neutro Abs: 8.1 10*3/uL — ABNORMAL HIGH (ref 1.7–7.7)
Neutrophils Relative %: 74 %
Platelets: 188 10*3/uL (ref 150–400)
RBC: 3.56 MIL/uL — ABNORMAL LOW (ref 3.87–5.11)
RDW: 13.3 % (ref 11.5–15.5)
WBC: 10.9 10*3/uL — ABNORMAL HIGH (ref 4.0–10.5)
nRBC: 0 % (ref 0.0–0.2)

## 2020-10-23 MED ORDER — GATIFLOXACIN 0.5 % OP SOLN
1.0000 [drp] | Freq: Every day | OPHTHALMIC | Status: DC
  Administered 2020-10-24 – 2020-10-26 (×3): 1 [drp] via OPHTHALMIC
  Filled 2020-10-23 (×3): qty 2.5

## 2020-10-23 MED ORDER — ATORVASTATIN CALCIUM 10 MG PO TABS
10.0000 mg | ORAL_TABLET | Freq: Every day | ORAL | Status: DC
  Administered 2020-10-23 – 2020-10-26 (×3): 10 mg via ORAL
  Filled 2020-10-23 (×4): qty 1

## 2020-10-23 MED ORDER — FOLIC ACID 1 MG PO TABS
2.0000 mg | ORAL_TABLET | Freq: Every day | ORAL | Status: DC
  Administered 2020-10-23: 2 mg via ORAL
  Filled 2020-10-23: qty 2

## 2020-10-23 MED ORDER — SODIUM CHLORIDE 0.9% FLUSH
3.0000 mL | Freq: Two times a day (BID) | INTRAVENOUS | Status: DC
  Administered 2020-10-23 – 2020-10-26 (×5): 3 mL via INTRAVENOUS

## 2020-10-23 MED ORDER — SODIUM CHLORIDE 0.9 % IV BOLUS
500.0000 mL | Freq: Once | INTRAVENOUS | Status: AC
  Administered 2020-10-23: 500 mL via INTRAVENOUS

## 2020-10-23 MED ORDER — PREDNISONE 20 MG PO TABS
40.0000 mg | ORAL_TABLET | Freq: Every day | ORAL | Status: DC
  Administered 2020-10-25 – 2020-10-26 (×2): 40 mg via ORAL
  Filled 2020-10-23 (×3): qty 2

## 2020-10-23 MED ORDER — ARTIFICIAL TEARS OPHTHALMIC OINT
1.0000 "application " | TOPICAL_OINTMENT | Freq: Every day | OPHTHALMIC | Status: DC | PRN
  Filled 2020-10-23: qty 3.5

## 2020-10-23 MED ORDER — ENOXAPARIN SODIUM 40 MG/0.4ML IJ SOSY
40.0000 mg | PREFILLED_SYRINGE | INTRAMUSCULAR | Status: DC
  Administered 2020-10-23: 40 mg via SUBCUTANEOUS
  Filled 2020-10-23: qty 0.4

## 2020-10-23 MED ORDER — ACETAMINOPHEN 325 MG PO TABS
650.0000 mg | ORAL_TABLET | Freq: Four times a day (QID) | ORAL | Status: DC | PRN
  Administered 2020-10-23 – 2020-10-24 (×2): 650 mg via ORAL
  Filled 2020-10-23 (×2): qty 2

## 2020-10-23 MED ORDER — SODIUM CHLORIDE 0.9 % IV SOLN: INTRAVENOUS | Status: AC

## 2020-10-23 MED ORDER — TIMOLOL MALEATE 0.5 % OP SOLN
1.0000 [drp] | Freq: Two times a day (BID) | OPHTHALMIC | Status: DC
  Administered 2020-10-23 – 2020-10-26 (×6): 1 [drp] via OPHTHALMIC
  Filled 2020-10-23 (×3): qty 5

## 2020-10-23 MED ORDER — DORZOLAMIDE HCL 2 % OP SOLN
1.0000 [drp] | Freq: Two times a day (BID) | OPHTHALMIC | Status: DC
  Administered 2020-10-24 – 2020-10-26 (×6): 1 [drp] via OPHTHALMIC
  Filled 2020-10-23 (×2): qty 10

## 2020-10-23 MED ORDER — INSULIN ASPART 100 UNIT/ML IJ SOLN
0.0000 [IU] | Freq: Three times a day (TID) | INTRAMUSCULAR | Status: DC
  Administered 2020-10-26: 2 [IU] via SUBCUTANEOUS
  Administered 2020-10-26: 1 [IU] via SUBCUTANEOUS

## 2020-10-23 MED ORDER — ASPIRIN EC 81 MG PO TBEC
81.0000 mg | DELAYED_RELEASE_TABLET | Freq: Every day | ORAL | Status: DC
  Administered 2020-10-23 – 2020-10-26 (×3): 81 mg via ORAL
  Filled 2020-10-23 (×4): qty 1

## 2020-10-23 MED ORDER — CYCLOSPORINE 0.05 % OP EMUL
1.0000 [drp] | Freq: Two times a day (BID) | OPHTHALMIC | Status: DC
  Administered 2020-10-24 – 2020-10-26 (×6): 1 [drp] via OPHTHALMIC
  Filled 2020-10-23 (×8): qty 30

## 2020-10-23 MED ORDER — DOXYCYCLINE HYCLATE 100 MG PO TABS
100.0000 mg | ORAL_TABLET | Freq: Two times a day (BID) | ORAL | Status: DC
  Administered 2020-10-23 – 2020-10-26 (×5): 100 mg via ORAL
  Filled 2020-10-23 (×6): qty 1

## 2020-10-23 MED ORDER — ACETAMINOPHEN 650 MG RE SUPP: 650.0000 mg | Freq: Four times a day (QID) | RECTAL | Status: DC | PRN

## 2020-10-23 NOTE — ED Triage Notes (Signed)
BIB [facility] after caregiver could not get a hold of pt. Per EMS, pt on floor r/t to fall. Pt not on thinners, denies LOC, denies hitting head. Pt A & O x 3 ( not to time), caregiver unsure if this is baseline.

## 2020-10-23 NOTE — H&P (Signed)
Date: 10/24/2020               Patient Name:  Terry Alexander MRN: [id number]  DOB: 12-16-1940 Age / Sex: 80 y.o., female   PCP: [facility] [place] Service: Internal Medicine Teaching Service         Attending Physician: Dr. Heber Osseo, Rachel Moulds, DO    First Contact: Mitzie Na, MD Pager: MB [phone]  Second Contact: Jeanie Cooks, MD Pager: [place] Rooks [phone]       After Hours (After 5p/  First Contact Pager: [phone]  weekends / holidays): Second Contact Pager: [phone]   SUBJECTIVE   Chief Complaint: Generalized weakness  History of Present Illness: went to bathroom, sat on toilet, couldn't get up, slid off, crawled around for four hours until [facility] nurse found her.   Patient is an 80 year old female with a history of HTN, HLD, DM, Sjogrens, and breast CA s/p mastectomy who presented with complaints of inability to get up off the toilet.  She reports that she sat down to go to the bathroom and was unable to get back up. When asked why she was unable to get up off the toilet she responds with "I do not know".  She does endorse more weakness in her right leg than normal but has trouble lifting her legs at baseline. She has a rolling walker but did not have it with her so she slid down on the floor attempting to find help but was unable to and crawled back to the toilet where she waited until home health nurse found her. Estimates she was on the floor for several hours. She has a medical alert necklace but forgot about it in the moment. She denies any dizziness or lightheadedness, reports no confusion or loss of consciousness, and denies any chest pain or shortness of breath. Denies fevers or chills.   She has very poor vision at baseline and has had difficulty taking care of herself recently, relying on family and neighbors. She reports that approximately 1 month ago, she began to decline and has had more trouble taking care of herself. She is eating less and  endorses a 55 pound weight loss over the past year. No fever or night sweats  ED Course: While in the emergency department, head CT found to be negative. EKG showed sinus rhythm. IM was consulted to further management  Meds:  Current Meds  Medication Sig   Ascorbic Acid (VITAMIN C) 1000 MG tablet Take 1,000 mg by mouth daily.   aspirin 81 MG tablet Take 81 mg by mouth daily.   Cholecalciferol 10 MCG (400 UNIT) CAPS Take 400 Units by mouth daily.   cycloSPORINE (RESTASIS) 0.05 % ophthalmic emulsion Place 1 drop into both eyes 2 (two) times daily.   dorzolamide (TRUSOPT) 2 % ophthalmic solution Place 1 drop into both eyes 2 (two) times daily.   doxycycline (VIBRA-TABS) 100 MG tablet Take 100 mg by mouth 2 (two) times daily.   fexofenadine (ALLEGRA) 180 MG tablet Take 180 mg by mouth daily as needed for allergies.   moxifloxacin (VIGAMOX) 0.5 % ophthalmic solution Place 1 drop into both eyes daily.   predniSONE (DELTASONE) 20 MG tablet Take 40 mg by mouth daily with breakfast.   timolol (BETIMOL) 0.5 % ophthalmic solution Place 1 drop into both eyes 2 (two) times daily.    Past Medical History:  Diagnosis Date   Breast cancer ([place])  Right side   Diabetes ([place])    High cholesterol    Sjogren's syndrome ([place])     Past Surgical History:  Procedure Laterality Date   ABDOMINAL HYSTERECTOMY     MASTECTOMY Right 1977   OTHER SURGICAL HISTORY  1977   Right breast removed    Tumor removed     Stomach    Social:  Lives With: Occupation: Support: Level of Function: PCP: Substances: 20+ year smoking history  Family History: none  Allergies: Allergies as of 10/23/2020 - Review Complete 10/23/2020  Allergen Reaction Noted   Methotrexate derivatives Other (See Comments) 08/21/2020   Codeine Nausea And Vomiting 09/23/2019    Review of Systems: A complete ROS was negative except as per HPI.   OBJECTIVE:   Physical Exam: Blood pressure 117/68, pulse 88, temperature 98.1 F  (36.7 C), temperature source Oral, resp. rate 14, SpO2 100 %.  Constitutional: ill appearing, in no acute distress HENT:  atraumatic, mucous membranes dry Eyes: keratoconjunctivits Neck: supple Cardiovascular: regular rate and rhythm, no m/r/g Pulmonary/Chest: normal work of breathing on room air, lungs clear to auscultation bilaterally Abdominal: soft, non-tender, non-distended MSK: normal bulk and tone Neurological: alert & oriented x 3, strength 4/5 RLE,  Skin: warm and dry Psych: mood and affect appropriate  Labs: CBC    Component Value Date/Time   WBC 9.6 10/23/2020 2359   RBC 3.19 (L) 10/23/2020 2359   HGB 10.6 (L) 10/23/2020 2359   HCT 32.1 (L) 10/23/2020 2359   PLT 206 10/23/2020 2359   MCV 100.6 (H) 10/23/2020 2359   MCH 33.2 10/23/2020 2359   MCHC 33.0 10/23/2020 2359   RDW 13.6 10/23/2020 2359   LYMPHSABS 2.2 10/23/2020 1214   MONOABS 0.5 10/23/2020 1214   EOSABS 0.0 10/23/2020 1214   BASOSABS 0.0 10/23/2020 1214     CMP     Component Value Date/Time   NA 131 (L) 10/23/2020 2359   NA 138 04/04/2014 1117   K 4.3 10/23/2020 2359   CL 102 10/23/2020 2359   CO2 20 (L) 10/23/2020 2359   GLUCOSE 203 (H) 10/23/2020 2359   BUN 17 10/23/2020 2359   BUN 17 04/04/2014 1117   CREATININE 0.99 10/23/2020 2359   CREATININE 1.07 (H) 06/20/2020 1426   CALCIUM 8.3 (L) 10/23/2020 2359   PROT 6.2 (L) 10/23/2020 1214   PROT 7.4 04/04/2014 1117   ALBUMIN 2.6 (L) 10/23/2020 1214   ALBUMIN 4.1 04/04/2014 1117   AST 61 (H) 10/23/2020 1214   ALT 41 10/23/2020 1214   ALKPHOS 103 10/23/2020 1214   BILITOT 1.2 10/23/2020 1214   BILITOT 0.4 04/04/2014 1117   GFRNONAA 57 (L) 10/23/2020 2359   GFRNONAA 49 (L) 06/20/2020 1426   GFRAA 56 (L) 06/20/2020 1426    Imaging: CT Head Wo Contrast  Result Date: 10/23/2020 CLINICAL DATA:  Mental status change, unknown cause EXAM: CT HEAD WITHOUT CONTRAST TECHNIQUE: Contiguous axial images were obtained from the base of the skull  through the vertex without intravenous contrast. COMPARISON:  None. FINDINGS: Brain: No evidence of acute infarction, hemorrhage, hydrocephalus, extra-axial collection or mass lesion/mass effect. Moderate patchy white matter hypoattenuation, nonspecific but compatible with chronic microvascular ischemic disease. Mild for age atrophy with ex vacuo ventricular dilation. Vascular: No hyperdense vessel identified. Calcific intracranial atherosclerosis. Skull: No acute fracture. Sinuses/Orbits: Visualized sinuses are clear. Other: No mastoid effusions. IMPRESSION: No evidence of acute intracranial abnormality. Electronically Signed   By: Margaretha Sheffield M.D.   On: 10/23/2020 13:40  MR BRAIN WO CONTRAST  Result Date: 10/23/2020 CLINICAL DATA:  Weakness EXAM: MRI HEAD WITHOUT CONTRAST TECHNIQUE: Multiplanar, multiecho pulse sequences of the brain and surrounding structures were obtained without intravenous contrast. COMPARISON:  None. FINDINGS: Brain: No acute infarct, mass effect or extra-axial collection. No acute or chronic hemorrhage. There is multifocal hyperintense T2-weighted signal within the white matter. Generalized volume loss without a clear lobar predilection. The midline structures are normal. Vascular: Major flow voids are preserved. Skull and upper cervical spine: Normal calvarium and skull base. Visualized upper cervical spine and soft tissues are normal. Sinuses/Orbits:No paranasal sinus fluid levels or advanced mucosal thickening. No mastoid or middle ear effusion. Normal orbits. IMPRESSION: 1. No acute intracranial abnormality. 2. Generalized volume loss and findings of chronic small vessel disease. Electronically Signed   By: Ulyses Jarred M.D.   On: 10/23/2020 23:04    EKG: personally reviewed my interpretation is nonspecific t wave inversions v1 and v2 with possible q waves in lead 1 and ii. 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 with pertinent PMH of hypertension hyperlipidemia diabetes Sjogren's who presented with generalized weakness and admitted for generalized weakness on hospital day 0. Found to have NSTEMI.  NSTEMI Hypotensive and has received 1.5 L normal saline.  Not reporting any chest pain or shortness of breath.  Bilateral lower extremity edema 1+ to knee noted on exam. - EKG with nonspecific changes T wave inversions in V1 and V2 and possible Q waves in lead I and II.  No prior EKG for comparison. - Troponins found to be elevated 3000.  Elevation of 3000 is more concerning for NSTEMI than demand ischemia.  Continue to trend - Cardiology consulted, will follow up their recommendations - Will check lipid panel and A1c - Start on aspirin 325 height - Trend troponin - Will obtain echo  #weakness new onset - likely related to NSTEMI as well.  Protein calorie deficit malnutrition and patient appears dehydrated clinically.  Furthermore she reports that she has had a 55 pound weight loss within the past year.   - CT head negative, however given acute onset right lower leg weakness, furthermore she has multiple risk factors for ischemic cerebral infarction including hyperlipidemia hypertension diabetes and Sjogren's, MRI brain obtained. MRI no acute changes - B12 elevated -She does have some hyponatremia with hyperkalemia. Given recent history of steroid use, it is possible that she has some adrenal insufficiency.  We will check an a.m. cortisol - UA - PT OT eval  Heel ulcer right - Patient has cool extremities with difficult to palpate pedal pulses.  There is a without strategizing to the patient dry ulcer noted on the medial surface of her right heel.  No purulence. - She is not having any fever and has no elevation white count currently. - Low concern for osteomyelitis and does  not appear to be actively infected.  Will defer x-ray and antibiotics for now. - Obtain ABIs.  #hyponatremia moderate to mild improving Slightly hyperglycemic - Obtain serum osms.  - Urine osms  Diabetes mellitus - novolog  Hypertension  Hyperlipidemia    ocular blindness  Diet: NPO VTE: None IVF: , Code: DNR  Prior to Admission Living Arrangement: Home, living   Anticipated Discharge Location: SNF Barriers to Discharge: pending medical stability  Dispo: Admit patient to Inpatient with expected length of stay greater than 2 midnights.  Signed: Delene Ruffini, MD Internal Medicine Resident PGY-1 Pager: [phone]  10/24/2020, 5:27 AM

## 2020-10-23 NOTE — ED Provider Notes (Signed)
Emergency Medicine Provider Triage Evaluation Note  Terry Alexander , a 80 y.o. female  was evaluated in triage.  Pt complains of fall.  Per EMS caregiver patient cannot get a hold of her and so called EMS.  When EMS arrived the patient was on the floor.  Per the patient she was at home sitting on the toilet and was trying to get up.  She states that she has keratoconjunctivitis which she is being seen by ophthalmology for but has difficulty seeing.  She states her walker was too far away from her.  So she lowered herself to the ground and was crawling.  She states however then she got confused about where she was in the house.  She denies hitting her head, loss of consciousness, pain anywhere.  She is alert and oriented, however it appears that she was not oriented to time with EMS.  Unsure of baseline.  Review of Systems  Positive: Confusion Negative: Fever, head trauma, headache  Physical Exam  BP (!) 126/103   Pulse 71   Temp 98.1 F (36.7 C) (Oral)   Resp 16   SpO2 96%  Gen:   Awake, no distress  Resp:  Normal effort  MSK:   Moves extremities without difficulty  Other:  Appears neurologically intact.  Pupils equal and reactive bilaterally.  No cranial nerve deficit.  Strength 5/5 in bilateral upper and lower extremities.  Medical Decision Making  Medically screening exam initiated at 12:10 PM.  Appropriate orders placed.  TERREA BRUSTER was informed that the remainder of the evaluation will be completed by another provider, this initial triage assessment does not replace that evaluation, and the importance of remaining in the ED until their evaluation is complete.     Mickie Hillier, PA-C 10/23/20 1212    Wyvonnia Dusky, MD 10/23/20 [phone]

## 2020-10-23 NOTE — ED Provider Notes (Signed)
Emergency Department Provider Note   I have reviewed the triage vital signs and the nursing notes.   HISTORY  Chief Complaint Fall   HPI JINA OLENICK is a 80 y.o. female with past medical history reviewed below presents to the emergency department after experience generalized weakness this morning.  Patient states she was sitting on the toilet and was unable to stand.  She has had vision issues recently after being treated for keratoconjunctivitis with her ophthalmologist, who saw you saw yesterday.  When she was unable to get up she slid herself down to the floor but then was afraid to move further because of her poor vision.  She typically ambulates with a walker but did not have this with her.  She does wear a medical alert necklace but forgot about this in the moment.  She estimates that she was on the ground for approximately 4 hours before her nursing aide arrived to her apartment and ultimately called for help.  She denies any fall from the toilet or head injury.  No loss of consciousness.  She tells me that at this time she is experiencing severe generalized weakness and some urine frequency. Denies dysuria.    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   Ptosis 04/04/2014   Vision changes 04/04/2014    Past Surgical History:  Procedure Laterality Date   ABDOMINAL HYSTERECTOMY     MASTECTOMY Right 1977   OTHER SURGICAL HISTORY  1977   Right breast removed    Tumor removed     Stomach    Allergies Methotrexate  derivatives and Codeine  Family History  Problem Relation Age of Onset   Heart attack Mother    Prostate cancer Father    Heart attack Father    Heart attack Son     Social History Social History   Tobacco Use   Smoking status: Former    Types: Cigarettes    Quit date: 01/14/1991    Years since quitting: 29.7   Smokeless tobacco: Never  Vaping Use   Vaping Use: Never used  Substance Use Topics   Alcohol use: Yes    Comment: Wine every once in a while   Drug use: No    Review of Systems  Constitutional: No fever/chills. Positive generalized weakness.  Eyes: No visual changes. ENT: No sore throat. Cardiovascular: Denies chest pain. Respiratory: Denies shortness of breath. Gastrointestinal: No abdominal pain.  No nausea, no vomiting.  No diarrhea.  No constipation. Genitourinary: Negative for dysuria. Positive urine frequency.  Musculoskeletal: Negative for back pain. Skin: Negative for rash. Neurological: Negative for headaches, focal weakness or numbness.  10-point ROS otherwise negative.  ____________________________________________   PHYSICAL EXAM:  VITAL SIGNS: ED Triage Vitals  Enc Vitals Group     BP 10/23/20 1136 (!) 126/103     Pulse Rate 10/23/20 1136 71     Resp 10/23/20 1136 16     Temp 10/23/20 1136 98.1 F (36.7 C)     Temp Source 10/23/20  1136 Oral     SpO2 10/23/20 1124 100 %   Constitutional: Alert and oriented. Well appearing and in no acute distress. Head: Atraumatic. Nose: No congestion/rhinnorhea. Mouth/Throat: Mucous membranes are dry.  Neck: No stridor.  Cardiovascular: Normal rate, regular rhythm. Good peripheral circulation. Grossly normal heart sounds.   Respiratory: Normal respiratory effort.  No retractions. Lungs CTAB. Gastrointestinal: Soft and nontender. No distention.  Musculoskeletal: No gross deformities of extremities. Neurologic:  Normal speech and language. 4/5 strength in the bilateral LEs. 4+/5 strength in the  bilateral upper extremities. Normal sensation throughout.  Skin:  Skin is warm, dry and intact. No rash noted.   ____________________________________________   LABS (all labs ordered are listed, but only abnormal results are displayed)  Labs Reviewed  COMPREHENSIVE METABOLIC PANEL - Abnormal; Notable for the following components:      Result Value   Sodium 127 (*)    Potassium 5.4 (*)    CO2 18 (*)    Glucose, Bld 209 (*)    Creatinine, Ser 1.07 (*)    Total Protein 6.2 (*)    Albumin 2.6 (*)    AST 61 (*)    GFR, Estimated 52 (*)    All other components within normal limits  CBC WITH DIFFERENTIAL/PLATELET - Abnormal; Notable for the following components:   WBC 10.9 (*)    RBC 3.56 (*)    HCT 35.9 (*)    MCV 100.8 (*)    Neutro Abs 8.1 (*)    All other components within normal limits  RESP PANEL BY RT-PCR (FLU A&B, COVID) [id number]  URINALYSIS, ROUTINE W REFLEX MICROSCOPIC   ____________________________________________  EKG   EKG Interpretation  Date/Time:  Tuesday October 23 2020 17:13:23 EDT Ventricular Rate:  85 PR Interval:  160 QRS Duration: 87 QT Interval:  390 QTC Calculation: 464 R Axis:   62 Text Interpretation: Sinus rhythm Low voltage, extremity and precordial leads Confirmed by Nanda Quinton [phone]) on 10/23/2020 5:50:47 PM        ____________________________________________  RADIOLOGY  CT Head Wo Contrast  Result Date: 10/23/2020 CLINICAL DATA:  Mental status change, unknown cause EXAM: CT HEAD WITHOUT CONTRAST TECHNIQUE: Contiguous axial images were obtained from the base of the skull through the vertex without intravenous contrast. COMPARISON:  None. FINDINGS: Brain: No evidence of acute infarction, hemorrhage, hydrocephalus, extra-axial collection or mass lesion/mass effect. Moderate patchy white matter hypoattenuation, nonspecific but compatible with chronic microvascular ischemic disease. Mild for age atrophy with ex vacuo ventricular dilation.  Vascular: No hyperdense vessel identified. Calcific intracranial atherosclerosis. Skull: No acute fracture. Sinuses/Orbits: Visualized sinuses are clear. Other: No mastoid effusions. IMPRESSION: No evidence of acute intracranial abnormality. Electronically Signed   By: Margaretha Sheffield M.D.   On: 10/23/2020 13:40    ____________________________________________   PROCEDURES  Procedure(s) performed:   Procedures  None  ____________________________________________   INITIAL IMPRESSION / ASSESSMENT AND PLAN / ED COURSE  Pertinent labs & imaging results that were available during my care of the patient were reviewed by me and considered in my medical decision making (see chart for details).   Patient presents to the emergency department with generalized weakness causing her to be unable to get off the toilet today.  She then spent multiple hours on the ground.  She has normal range of motion of all extremities and no outward sign of injury/trauma.  Lab work and CT imaging of the head obtained during the MSE process.  She does have hyponatremia which is new along  with elevated blood sugars potassium of 5.4.  No severe anemia. No unilateral symptoms to strongly suspect CVA. Will obtain a UA along with COVID swab.  Suspect that her generalized weakness may be related to acute dehydration and possibly from hyponatremia.  While her sodium of 127 is not severe it is significantly down from her prior lab values upon chart review.   Discussed patient's case with inpatient team to request admission. Patient and family (if present) updated with plan. Care transferred to inpatient service.  I reviewed all nursing notes, vitals, pertinent old records, EKGs, labs, imaging (as available).  ____________________________________________  FINAL CLINICAL IMPRESSION(S) / ED DIAGNOSES  Final diagnoses:  Hyponatremia  Dehydration  Generalized weakness    MEDICATIONS GIVEN DURING THIS VISIT:  Medications   sodium chloride 0.9 % bolus 500 mL (500 mLs Intravenous New Bag/Given 10/23/20 1717)   Note:  This document was prepared using Dragon voice recognition software and may include unintentional dictation errors.  Nanda Quinton, MD, [facility] Emergency Medicine    Ellanore Vanhook, W[facility], MD 10/23/20 Carollee Massed

## 2020-10-24 ENCOUNTER — Inpatient Hospital Stay (HOSPITAL_COMMUNITY): Payer: Medicare (Managed Care)

## 2020-10-24 ENCOUNTER — Encounter (HOSPITAL_COMMUNITY): Payer: Self-pay | Admitting: Internal Medicine

## 2020-10-24 DIAGNOSIS — Z6822 Body mass index (BMI) 22.0-22.9, adult: Secondary | ICD-10-CM | POA: Diagnosis not present

## 2020-10-24 DIAGNOSIS — Z20822 Contact with and (suspected) exposure to covid-19: Secondary | ICD-10-CM | POA: Diagnosis present

## 2020-10-24 DIAGNOSIS — M3500 Sicca syndrome, unspecified: Secondary | ICD-10-CM | POA: Diagnosis present

## 2020-10-24 DIAGNOSIS — R339 Retention of urine, unspecified: Secondary | ICD-10-CM | POA: Diagnosis present

## 2020-10-24 DIAGNOSIS — L0390 Cellulitis, unspecified: Secondary | ICD-10-CM | POA: Diagnosis not present

## 2020-10-24 DIAGNOSIS — I959 Hypotension, unspecified: Secondary | ICD-10-CM | POA: Diagnosis present

## 2020-10-24 DIAGNOSIS — I214 Non-ST elevation (NSTEMI) myocardial infarction: Secondary | ICD-10-CM | POA: Diagnosis present

## 2020-10-24 DIAGNOSIS — I429 Cardiomyopathy, unspecified: Secondary | ICD-10-CM | POA: Diagnosis present

## 2020-10-24 DIAGNOSIS — E871 Hypo-osmolality and hyponatremia: Secondary | ICD-10-CM | POA: Diagnosis present

## 2020-10-24 DIAGNOSIS — R9431 Abnormal electrocardiogram [ECG] [EKG]: Secondary | ICD-10-CM | POA: Diagnosis not present

## 2020-10-24 DIAGNOSIS — H547 Unspecified visual loss: Secondary | ICD-10-CM | POA: Diagnosis present

## 2020-10-24 DIAGNOSIS — Z66 Do not resuscitate: Secondary | ICD-10-CM | POA: Diagnosis present

## 2020-10-24 DIAGNOSIS — R0989 Other specified symptoms and signs involving the circulatory and respiratory systems: Secondary | ICD-10-CM

## 2020-10-24 DIAGNOSIS — L89612 Pressure ulcer of right heel, stage 2: Secondary | ICD-10-CM | POA: Diagnosis present

## 2020-10-24 DIAGNOSIS — I10 Essential (primary) hypertension: Secondary | ICD-10-CM | POA: Diagnosis present

## 2020-10-24 DIAGNOSIS — H16209 Unspecified keratoconjunctivitis, unspecified eye: Secondary | ICD-10-CM | POA: Diagnosis present

## 2020-10-24 DIAGNOSIS — Z885 Allergy status to narcotic agent status: Secondary | ICD-10-CM | POA: Diagnosis not present

## 2020-10-24 DIAGNOSIS — E43 Unspecified severe protein-calorie malnutrition: Secondary | ICD-10-CM | POA: Diagnosis present

## 2020-10-24 DIAGNOSIS — R54 Age-related physical debility: Secondary | ICD-10-CM | POA: Diagnosis present

## 2020-10-24 DIAGNOSIS — Z853 Personal history of malignant neoplasm of breast: Secondary | ICD-10-CM | POA: Diagnosis not present

## 2020-10-24 DIAGNOSIS — F05 Delirium due to known physiological condition: Secondary | ICD-10-CM | POA: Diagnosis not present

## 2020-10-24 DIAGNOSIS — I340 Nonrheumatic mitral (valve) insufficiency: Secondary | ICD-10-CM | POA: Diagnosis present

## 2020-10-24 DIAGNOSIS — Z888 Allergy status to other drugs, medicaments and biological substances status: Secondary | ICD-10-CM | POA: Diagnosis not present

## 2020-10-24 DIAGNOSIS — E7800 Pure hypercholesterolemia, unspecified: Secondary | ICD-10-CM | POA: Diagnosis present

## 2020-10-24 DIAGNOSIS — E1165 Type 2 diabetes mellitus with hyperglycemia: Secondary | ICD-10-CM | POA: Diagnosis present

## 2020-10-24 DIAGNOSIS — Z9011 Acquired absence of right breast and nipple: Secondary | ICD-10-CM | POA: Diagnosis not present

## 2020-10-24 DIAGNOSIS — E860 Dehydration: Secondary | ICD-10-CM | POA: Diagnosis present

## 2020-10-24 LAB — CBC
HCT: 27.9 % — ABNORMAL LOW (ref 36.0–46.0)
HCT: 32.1 % — ABNORMAL LOW (ref 36.0–46.0)
Hemoglobin: 10.6 g/dL — ABNORMAL LOW (ref 12.0–15.0)
Hemoglobin: 9.4 g/dL — ABNORMAL LOW (ref 12.0–15.0)
MCH: 33 pg (ref 26.0–34.0)
MCH: 33.2 pg (ref 26.0–34.0)
MCHC: 33 g/dL (ref 30.0–36.0)
MCHC: 33.7 g/dL (ref 30.0–36.0)
MCV: 100.6 fL — ABNORMAL HIGH (ref 80.0–100.0)
MCV: 97.9 fL (ref 80.0–100.0)
Platelets: 206 10*3/uL (ref 150–400)
Platelets: 226 10*3/uL (ref 150–400)
RBC: 2.85 MIL/uL — ABNORMAL LOW (ref 3.87–5.11)
RBC: 3.19 MIL/uL — ABNORMAL LOW (ref 3.87–5.11)
RDW: 13.6 % (ref 11.5–15.5)
RDW: 13.6 % (ref 11.5–15.5)
WBC: 8.2 10*3/uL (ref 4.0–10.5)
WBC: 9.6 10*3/uL (ref 4.0–10.5)
nRBC: 0 % (ref 0.0–0.2)
nRBC: 0 % (ref 0.0–0.2)

## 2020-10-24 LAB — ECHOCARDIOGRAM COMPLETE
AR max vel: 2.07 cm2
AV Area VTI: 2.25 cm2
AV Area mean vel: 2.23 cm2
AV Mean grad: 3 mmHg
AV Peak grad: 5.6 mmHg
Ao pk vel: 1.18 m/s
Area-P 1/2: 4.99 cm2
Calc EF: 46.7 %
Height: 63 in
MV VTI: 4.11 cm2
S' Lateral: 2.4 cm
Single Plane A2C EF: 24.2 %
Single Plane A4C EF: 62.4 %
Weight: 2000.01 oz

## 2020-10-24 LAB — BASIC METABOLIC PANEL
Anion gap: 9 (ref 5–15)
Anion gap: 6 (ref 5–15)
BUN: 17 mg/dL (ref 8–23)
BUN: 13 mg/dL (ref 8–23)
CO2: 20 mmol/L — ABNORMAL LOW (ref 22–32)
CO2: 22 mmol/L (ref 22–32)
Calcium: 8.3 mg/dL — ABNORMAL LOW (ref 8.9–10.3)
Calcium: 7.7 mg/dL — ABNORMAL LOW (ref 8.9–10.3)
Chloride: 102 mmol/L (ref 98–111)
Chloride: 102 mmol/L (ref 98–111)
Creatinine, Ser: 0.99 mg/dL (ref 0.44–1.00)
Creatinine, Ser: 0.92 mg/dL (ref 0.44–1.00)
GFR, Estimated: 57 mL/min — ABNORMAL LOW (ref >60)
GFR, Estimated: >60 mL/min (ref >60)
Glucose, Bld: 203 mg/dL — ABNORMAL HIGH (ref 70–99)
Glucose, Bld: 131 mg/dL — ABNORMAL HIGH (ref 70–99)
Potassium: 4.3 mmol/L (ref 3.5–5.1)
Potassium: 3.7 mmol/L (ref 3.5–5.1)
Sodium: 131 mmol/L — ABNORMAL LOW (ref 135–145)
Sodium: 130 mmol/L — ABNORMAL LOW (ref 135–145)

## 2020-10-24 LAB — LIPID PANEL
Cholesterol: 101 mg/dL (ref 0–200)
HDL: 53 mg/dL (ref >40)
LDL Cholesterol: 32 mg/dL (ref 0–99)
Total CHOL/HDL Ratio: 1.9 RATIO
Triglycerides: 81 mg/dL (ref <150)
VLDL: 16 mg/dL (ref 0–40)

## 2020-10-24 LAB — GLUCOSE, CAPILLARY
Glucose-Capillary: 92 mg/dL (ref 70–99)
Glucose-Capillary: 92 mg/dL (ref 70–99)

## 2020-10-24 LAB — TROPONIN I (HIGH SENSITIVITY)
Troponin I (High Sensitivity): 3248 ng/L (ref <18)
Troponin I (High Sensitivity): 3405 ng/L (ref <18)
Troponin I (High Sensitivity): 2874 ng/L (ref <18)
Troponin I (High Sensitivity): 1583 ng/L (ref <18)

## 2020-10-24 LAB — PROTIME-INR
INR: 1 (ref 0.8–1.2)
Prothrombin Time: 13.6 seconds (ref 11.4–15.2)

## 2020-10-24 LAB — MAGNESIUM: Magnesium: 1.5 mg/dL — ABNORMAL LOW (ref 1.7–2.4)

## 2020-10-24 LAB — CBG MONITORING, ED
Glucose-Capillary: 84 mg/dL (ref 70–99)
Glucose-Capillary: 91 mg/dL (ref 70–99)

## 2020-10-24 LAB — HEMOGLOBIN A1C
Hgb A1c MFr Bld: 6.8 % — ABNORMAL HIGH (ref 4.8–5.6)
Mean Plasma Glucose: 148.46 mg/dL

## 2020-10-24 LAB — CK: Total CK: 158 U/L (ref 38–234)

## 2020-10-24 LAB — LACTIC ACID, PLASMA: Lactic Acid, Venous: 3.1 mmol/L (ref 0.5–1.9)

## 2020-10-24 LAB — CORTISOL-AM, BLOOD: Cortisol - AM: 12.6 ug/dL (ref 6.7–22.6)

## 2020-10-24 LAB — OSMOLALITY: Osmolality: 276 mOsm/kg (ref 275–295)

## 2020-10-24 LAB — VITAMIN B12: Vitamin B-12: 1322 pg/mL — ABNORMAL HIGH (ref 180–914)

## 2020-10-24 LAB — PHOSPHORUS: Phosphorus: 2.1 mg/dL — ABNORMAL LOW (ref 2.5–4.6)

## 2020-10-24 LAB — HEPARIN LEVEL (UNFRACTIONATED): Heparin Unfractionated: <0.1 IU/mL — ABNORMAL LOW (ref 0.30–0.70)

## 2020-10-24 LAB — FOLATE: Folate: 14.9 ng/mL (ref >5.9)

## 2020-10-24 LAB — TSH: TSH: 3.963 u[IU]/mL (ref 0.350–4.500)

## 2020-10-24 LAB — APTT: aPTT: 32 seconds (ref 24–36)

## 2020-10-24 MED ORDER — FOLIC ACID 1 MG PO TABS
2.0000 mg | ORAL_TABLET | Freq: Every day | ORAL | Status: DC
  Administered 2020-10-25 – 2020-10-26 (×2): 2 mg via ORAL
  Filled 2020-10-24 (×3): qty 2

## 2020-10-24 MED ORDER — SODIUM CHLORIDE 0.9 % IV BOLUS
1000.0000 mL | Freq: Once | INTRAVENOUS | Status: AC
  Administered 2020-10-24: 1000 mL via INTRAVENOUS

## 2020-10-24 MED ORDER — MAGNESIUM SULFATE 4 GM/100ML IV SOLN
4.0000 g | Freq: Once | INTRAVENOUS | Status: AC
  Administered 2020-10-24: 4 g via INTRAVENOUS
  Filled 2020-10-24: qty 100

## 2020-10-24 MED ORDER — HEPARIN (PORCINE) 25000 UT/250ML-% IV SOLN: 700.0000 [IU]/h | INTRAVENOUS | Status: DC

## 2020-10-24 MED ORDER — ASPIRIN 325 MG PO TABS: 325.0000 mg | ORAL_TABLET | Freq: Every day | ORAL | Status: DC

## 2020-10-24 MED ORDER — ASPIRIN 325 MG PO TABS
325.0000 mg | ORAL_TABLET | Freq: Once | ORAL | Status: AC
  Administered 2020-10-24: 325 mg via ORAL
  Filled 2020-10-24: qty 1

## 2020-10-24 MED ORDER — BIOTENE DRY MOUTH MT LIQD: 15.0000 mL | OROMUCOSAL | Status: DC | PRN

## 2020-10-24 MED ORDER — LACTATED RINGERS IV BOLUS: 1000.0000 mL | Freq: Once | INTRAVENOUS | Status: DC

## 2020-10-24 MED ORDER — HEPARIN BOLUS VIA INFUSION
3500.0000 [IU] | Freq: Once | INTRAVENOUS | Status: DC
  Filled 2020-10-24: qty 3500

## 2020-10-24 NOTE — ED Notes (Signed)
Internal medicine at bedside

## 2020-10-24 NOTE — Progress Notes (Signed)
ABI study attempted. Patient currently having echo.  Will attempt again as schedule and patient availability permits.  Darlin Coco, RDMS, RVT

## 2020-10-24 NOTE — Plan of Care (Signed)
  Problem: Education: Goal: Knowledge of General Education information will improve Description: Including pain rating scale, medication(s)/side effects and non-pharmacologic comfort measures Outcome: Progressing   Problem: Health Behavior/Discharge Planning: Goal: Ability to manage health-related needs will improve Outcome: Progressing   Problem: Clinical Measurements: Goal: Ability to maintain clinical measurements within normal limits will improve Outcome: Progressing Goal: Will remain free from infection Outcome: Progressing Goal: Diagnostic test results will improve Outcome: Progressing Goal: Respiratory complications will improve Outcome: Progressing Goal: Cardiovascular complication will be avoided Outcome: Progressing   Problem: Coping: Goal: Level of anxiety will decrease Outcome: Progressing   Problem: Elimination: Goal: Will not experience complications related to bowel motility Outcome: Progressing Goal: Will not experience complications related to urinary retention Outcome: Progressing   Problem: Safety: Goal: Ability to remain free from injury will improve Outcome: Progressing

## 2020-10-24 NOTE — Progress Notes (Deleted)
ANTICOAGULATION CONSULT NOTE - Initial Consult  Pharmacy Consult for heparin Indication: chest pain/ACS  Allergies  Allergen Reactions   Methotrexate Derivatives Other (See Comments)    Transaminitis, anemia, weight loss   Codeine Nausea And Vomiting    Patient Measurements: Height: 5\' 3"  (160 cm) Weight: 56.7 kg (125 lb) IBW/kg (Calculated) : 52.4 Heparin Dosing Weight: 56.7 kg  Vital Signs: BP: 91/52 (10/12 0645) Pulse Rate: 69 (10/12 0600)  Labs: Recent Labs    10/23/20 1214 10/23/20 2359 10/24/20 0447  HGB 12.0 10.6* 9.4*  HCT 35.9* 32.1* 27.9*  PLT 188 206 226  APTT  --  32  --   LABPROT  --  13.6  --   INR  --  1.0  --   CREATININE 1.07* 0.99 0.92  CKTOTAL  --  158  --   TROPONINIHS  --  3,248* 3,405*    Estimated Creatinine Clearance: 39 mL/min (by C-G formula based on SCr of 0.92 mg/dL).   Medical History: Past Medical History:  Diagnosis Date   Breast cancer ([place])    Right side   Diabetes ([place])    High cholesterol    Sjogren's syndrome ([facility])     Medications: see MAR  Assessment: 80 yo F with NSTEMI. Cardiology consulted, heparin consult for pharmacy for [facility]. CBC wnl. Troponin elevated to >3000. No AC PTA.   Goal of Therapy:  Heparin level 0.3-0.7 units/ml Monitor platelets by anticoagulation protocol: Yes   Plan:  Give 3500 units bolus x 1 Start heparin infusion at 700 units/hr Check anti-Xa level in 8 hours and daily while on heparin Continue to monitor H&H and platelets  Joetta Manners, PharmD, [facility] Emergency Medicine Clinical Pharmacist ED RPh Phone: [place]: [phone]

## 2020-10-24 NOTE — Progress Notes (Signed)
ABI w/ TBI study completed.   Please see CV Proc for preliminary results.   Jacklyn Branan, RDMS, RVT  

## 2020-10-24 NOTE — ED Notes (Signed)
ED TO INPATIENT HANDOFF REPORT  ED Nurse Name and Phone #:   S Name/Age/Gender Terry Alexander 80 y.o. female Room/Bed: 042C/042C  Code Status   Code Status: DNR  Home/SNF/Other Home Patient oriented to: self, place, time, and situation Is this baseline? Yes   Triage Complete: Triage complete  Chief Complaint Generalized weakness [R53.1]  Triage Note BIB [facility] after caregiver could not get a hold of pt. Per EMS, pt on floor r/t to fall. Pt not on thinners, denies LOC, denies hitting head. Pt A & O x 3 ( not to time), caregiver unsure if this is baseline.    Allergies Allergies  Allergen Reactions   Methotrexate Derivatives Other (See Comments)    Transaminitis, anemia, weight loss   Codeine Nausea And Vomiting    Level of Care/Admitting Diagnosis ED Disposition     ED Disposition  Admit   Condition  --   [place]: [place] [[id number]]  Level of Care: Telemetry Medical [[id number]]  May admit patient to [facility] or [facility] if equivalent level of care is available:: No  Covid Evaluation: Confirmed COVID Negative  Diagnosis: Generalized weakness [387564]  Admitting Physician: Lucious Groves [[id number]]  Attending Physician: Lucious Groves [[id number]]  Estimated length of stay: past midnight tomorrow  Certification:: I certify this patient will need inpatient services for at least 2 midnights          B Medical/Surgery History Past Medical History:  Diagnosis Date   Breast cancer ([place])    Right side   Diabetes ([place])    High cholesterol    Sjogren's syndrome ([place])    Past Surgical History:  Procedure Laterality Date   ABDOMINAL HYSTERECTOMY     MASTECTOMY Right [place]   Right breast removed    Tumor removed     Stomach     A IV Location/Drains/Wounds Patient Lines/Drains/Airways Status     Active Line/Drains/Airways     Name Placement date Placement time Site Days   Peripheral IV 10/23/20 22  G Posterior;Right Hand 10/23/20  --  Hand  1            Intake/Output Last 24 hours  Intake/Output Summary (Last 24 hours) at 10/24/2020 1339 Last data filed at 10/24/2020 3329 Gross per 24 hour  Intake 1117.94 ml  Output --  Net 1117.94 ml    Labs/Imaging Results for orders placed or performed during the hospital encounter of 10/23/20 (from the past 48 hour(s))  Comprehensive metabolic panel     Status: Abnormal   Collection Time: 10/23/20 12:14 PM  Result Value Ref Range   Sodium 127 (L) 135 - 145 mmol/L   Potassium 5.4 (H) 3.5 - 5.1 mmol/L   Chloride 98 98 - 111 mmol/L   CO2 18 (L) 22 - 32 mmol/L   Glucose, Bld 209 (H) 70 - 99 mg/dL    Comment: Glucose reference range applies only to samples taken after fasting for at least 8 hours.   BUN 19 8 - 23 mg/dL   Creatinine, Ser 1.07 (H) 0.44 - 1.00 mg/dL   Calcium 9.0 8.9 - 10.3 mg/dL   Total Protein 6.2 (L) 6.5 - 8.1 g/dL   Albumin 2.6 (L) 3.5 - 5.0 g/dL   AST 61 (H) 15 - 41 U/L   ALT 41 0 - 44 U/L   Alkaline Phosphatase 103 38 - 126 U/L   Total Bilirubin 1.2 0.3 -  1.2 mg/dL   GFR, Estimated 52 (L) >60 mL/min    Comment: (NOTE) Calculated using the CKD-EPI Creatinine Equation (2021)    Anion gap 11 5 - 15    Comment: Performed at [facility], [place] [address] [place], [place] [address]  CBC with Differential     Status: Abnormal   Collection Time: 10/23/20 12:14 PM  Result Value Ref Range   WBC 10.9 (H) 4.0 - 10.5 K/uL   RBC 3.56 (L) 3.87 - 5.11 MIL/uL   Hemoglobin 12.0 12.0 - 15.0 g/dL   HCT 35.9 (L) 36.0 - 46.0 %   MCV 100.8 (H) 80.0 - 100.0 fL   MCH 33.7 26.0 - 34.0 pg   MCHC 33.4 30.0 - 36.0 g/dL   RDW 13.3 11.5 - 15.5 %   Platelets 188 150 - 400 K/uL    Comment: REPEATED TO VERIFY   nRBC 0.0 0.0 - 0.2 %   Neutrophils Relative % 74 %   Neutro Abs 8.1 (H) 1.7 - 7.7 K/uL   Lymphocytes Relative 20 %   Lymphs Abs 2.2 0.7 - 4.0 K/uL   Monocytes Relative 5 %   Monocytes Absolute 0.5 0.1 - 1.0 K/uL    Eosinophils Relative 0 %   Eosinophils Absolute 0.0 0.0 - 0.5 K/uL   Basophils Relative 0 %   Basophils Absolute 0.0 0.0 - 0.1 K/uL   Immature Granulocytes 1 %   Abs Immature Granulocytes 0.06 0.00 - 0.07 K/uL    Comment: Performed at [facility], [place] [address] [place], [place] [address]  Resp Panel by RT-PCR (Flu A&B, Covid) Nasopharyngeal Swab     Status: None   Collection Time: 10/23/20  6:02 PM   Specimen: Nasopharyngeal Swab; Nasopharyngeal(NP) swabs in vial transport medium  Result Value Ref Range   SARS Coronavirus 2 by RT PCR NEGATIVE NEGATIVE    Comment: (NOTE) SARS-CoV-2 target nucleic acids are NOT DETECTED.  The SARS-CoV-2 RNA is generally detectable in upper respiratory specimens during the acute phase of infection. The lowest concentration of SARS-CoV-2 viral copies this assay can detect is 138 copies/mL. A negative result does not preclude SARS-Cov-2 infection and should not be used as the sole basis for treatment or other patient management decisions. A negative result may occur with  improper specimen collection/handling, submission of specimen other than nasopharyngeal swab, presence of viral mutation(s) within the areas targeted by this assay, and inadequate number of viral copies(<138 copies/mL). A negative result must be combined with clinical observations, patient history, and epidemiological information. The expected result is Negative.  Fact Sheet for Patients:  EntrepreneurPulse.com.au  Fact Sheet for Healthcare Providers:  IncredibleEmployment.be  This test is no t yet approved or cleared by the [place] FDA and  has been authorized for detection and/or diagnosis of SARS-CoV-2 by FDA under an Emergency Use Authorization (EUA). This EUA will remain  in effect (meaning this test can be used) for the duration of the COVID-19 declaration under Section 564(b)(1) of the Act, 21 U.S.C.section 360bbb-3(b)(1),  unless the authorization is terminated  or revoked sooner.       Influenza A by PCR NEGATIVE NEGATIVE   Influenza B by PCR NEGATIVE NEGATIVE    Comment: (NOTE) The Xpert Xpress SARS-CoV-2/FLU/RSV plus assay is intended as an aid in the diagnosis of influenza from Nasopharyngeal swab specimens and should not be used as a sole basis for treatment. Nasal washings and aspirates are unacceptable for Xpert Xpress SARS-CoV-2/FLU/RSV testing.  Fact  Sheet for Patients: EntrepreneurPulse.com.au  Fact Sheet for Healthcare Providers: IncredibleEmployment.be  This test is not yet approved or cleared by the [place] FDA and has been authorized for detection and/or diagnosis of SARS-CoV-2 by FDA under an Emergency Use Authorization (EUA). This EUA will remain in effect (meaning this test can be used) for the duration of the COVID-19 declaration under Section 564(b)(1) of the Act, 21 U.S.C. section 360bbb-3(b)(1), unless the authorization is terminated or revoked.  Performed at [facility], [place] [address] [place], [place] [address]   Basic metabolic panel     Status: Abnormal   Collection Time: 10/23/20 11:59 PM  Result Value Ref Range   Sodium 131 (L) 135 - 145 mmol/L   Potassium 4.3 3.5 - 5.1 mmol/L   Chloride 102 98 - 111 mmol/L   CO2 20 (L) 22 - 32 mmol/L   Glucose, Bld 203 (H) 70 - 99 mg/dL    Comment: Glucose reference range applies only to samples taken after fasting for at least 8 hours.   BUN 17 8 - 23 mg/dL   Creatinine, Ser 0.99 0.44 - 1.00 mg/dL   Calcium 8.3 (L) 8.9 - 10.3 mg/dL   GFR, Estimated 57 (L) >60 mL/min    Comment: (NOTE) Calculated using the CKD-EPI Creatinine Equation (2021)    Anion gap 9 5 - 15    Comment: Performed at [facility] [address] [place], [place] [address]  Magnesium     Status: Abnormal   Collection Time: 10/23/20 11:59 PM  Result Value Ref Range   Magnesium 1.5 (L) 1.7 - 2.4 mg/dL     Comment: Performed at [facility] [address] [place], [place] [address]  Protime-INR     Status: None   Collection Time: 10/23/20 11:59 PM  Result Value Ref Range   Prothrombin Time 13.6 11.4 - 15.2 seconds   INR 1.0 0.8 - 1.2    Comment: (NOTE) INR goal varies based on device and disease states. Performed at [facility], [place] [address] [place], [place] [address]   TSH     Status: None   Collection Time: 10/23/20 11:59 PM  Result Value Ref Range   TSH 3.963 0.350 - 4.500 uIU/mL    Comment: Performed by a 3rd Generation assay with a functional sensitivity of <=0.01 uIU/mL. Performed at [facility], [place] [address], [place], [place] [address]   APTT     Status: None   Collection Time: 10/23/20 11:59 PM  Result Value Ref Range   aPTT 32 24 - 36 seconds    Comment: Performed at [facility] [address], [place], [place] [address]  CBC     Status: Abnormal   Collection Time: 10/23/20 11:59 PM  Result Value Ref Range   WBC 9.6 4.0 - 10.5 K/uL   RBC 3.19 (L) 3.87 - 5.11 MIL/uL   Hemoglobin 10.6 (L) 12.0 - 15.0 g/dL   HCT 32.1 (L) 36.0 - 46.0 %   MCV 100.6 (H) 80.0 - 100.0 fL   MCH 33.2 26.0 - 34.0 pg   MCHC 33.0 30.0 - 36.0 g/dL   RDW 13.6 11.5 - 15.5 %   Platelets 206 150 - 400 K/uL   nRBC 0.0 0.0 - 0.2 %    Comment: Performed at [facility], [place] [address], [place], [place] [address]  Hemoglobin A1c     Status: Abnormal   Collection Time:  10/23/20 11:59 PM  Result Value Ref Range   Hgb A1c MFr Bld 6.8 (H) 4.8 - 5.6 %    Comment: (NOTE) Pre diabetes:          5.7%-6.4%  Diabetes:              >6.4%  Glycemic control for   <7.0% adults with diabetes    Mean Plasma Glucose 148.46 mg/dL    Comment: Performed at [facility] [address], [place], [place] [address]  Troponin I (High Sensitivity)     Status: Abnormal   Collection Time: 10/23/20 11:59 PM  Result Value Ref Range   Troponin I (High Sensitivity) 3,248  (HH) <18 ng/L    Comment: CRITICAL RESULT CALLED TO, READ BACK BY AND VERIFIED WITHSallee Lange M,RN 10/24/20 0107 WAYK Performed at [facility], [address] [place], [place] [address]   Lactic acid, plasma     Status: Abnormal   Collection Time: 10/23/20 11:59 PM  Result Value Ref Range   Lactic Acid, Venous 3.1 (HH) 0.5 - 1.9 mmol/L    Comment: CRITICAL RESULT CALLED TO, READ BACK BY AND VERIFIED WITH: Erasmo Score B,RN 10/24/20 0053 WAYK Performed at [facility], [place] [address], [place], [place] [address]   Folate, serum, performed at [facility] lab     Status: None   Collection Time: 10/23/20 11:59 PM  Result Value Ref Range   Folate 14.9 >5.9 ng/mL    Comment: Performed at [facility], [place] [address], [place], [place] [address]  Vitamin B12     Status: Abnormal   Collection Time: 10/23/20 11:59 PM  Result Value Ref Range   Vitamin B-12 1,322 (H) 180 - 914 pg/mL    Comment: (NOTE) This assay is not validated for testing neonatal or myeloproliferative syndrome specimens for Vitamin B12 levels. Performed at [facility], [place] [address], [place], [place] [address]   Osmolality     Status: None   Collection Time: 10/23/20 11:59 PM  Result Value Ref Range   Osmolality 276 275 - 295 mOsm/kg    Comment: Performed at [facility] [address], [place], [place] [address]  CK     Status: None   Collection Time: 10/23/20 11:59 PM  Result Value Ref Range   Total CK 158 38 - 234 U/L    Comment: Performed at [facility], [place] [address] [place], [place] [address]  Phosphorus     Status: Abnormal   Collection Time: 10/23/20 11:59 PM  Result Value Ref Range   Phosphorus 2.1 (L) 2.5 - 4.6 mg/dL    Comment: Performed at [facility] [address] [place], [place] [address]  Cortisol-am, blood     Status: None   Collection Time: 10/24/20  4:47 AM  Result Value Ref Range   Cortisol - AM 12.6 6.7 - 22.6 ug/dL    Comment: Performed at  [facility], [place] [address], [place], [place] [address]  Lipid panel     Status: None   Collection Time: 10/24/20  4:47 AM  Result Value Ref Range   Cholesterol 101 0 - 200 mg/dL   Triglycerides 81 <150 mg/dL   HDL 53 >40 mg/dL   Total CHOL/HDL Ratio 1.9 RATIO   VLDL 16 0 - 40 mg/dL   LDL Cholesterol 32 0 - 99 mg/dL    Comment:  Total Cholesterol/HDL:CHD Risk Coronary Heart Disease Risk Table                     Men   Women  1/2 Average Risk   3.4   3.3  Average Risk       5.0   4.4  2 X Average Risk   9.6   7.1  3 X Average Risk  23.4   11.0        Use the calculated Patient Ratio above and the CHD Risk Table to determine the patient's CHD Risk.        ATP III CLASSIFICATION (LDL):  <100     mg/dL   Optimal  100-129  mg/dL   Near or Above                    Optimal  130-159  mg/dL   Borderline  160-189  mg/dL   High  >190     mg/dL   Very High Performed at [facility] [address], [place], [place] [address]   CBC     Status: Abnormal   Collection Time: 10/24/20  4:47 AM  Result Value Ref Range   WBC 8.2 4.0 - 10.5 K/uL   RBC 2.85 (L) 3.87 - 5.11 MIL/uL   Hemoglobin 9.4 (L) 12.0 - 15.0 g/dL   HCT 27.9 (L) 36.0 - 46.0 %   MCV 97.9 80.0 - 100.0 fL   MCH 33.0 26.0 - 34.0 pg   MCHC 33.7 30.0 - 36.0 g/dL   RDW 13.6 11.5 - 15.5 %   Platelets 226 150 - 400 K/uL   nRBC 0.0 0.0 - 0.2 %    Comment: Performed at [facility], [place] [address], [place], [place] [address]  Troponin I (High Sensitivity)     Status: Abnormal   Collection Time: 10/24/20  4:47 AM  Result Value Ref Range   Troponin I (High Sensitivity) 3,405 (HH) <18 ng/L    Comment: CRITICAL VALUE NOTED.  VALUE IS CONSISTENT WITH PREVIOUSLY REPORTED AND CALLED VALUE. (NOTE) Elevated high sensitivity troponin I (hsTnI) values and significant  changes across serial measurements may suggest ACS but many other  chronic and acute conditions are known to elevate hsTnI results.  Refer to the  Links section for chest pain algorithms and additional  guidance. Performed at [facility], [place] [address], [place], [place] [address]   Basic metabolic panel     Status: Abnormal   Collection Time: 10/24/20  4:47 AM  Result Value Ref Range   Sodium 130 (L) 135 - 145 mmol/L   Potassium 3.7 3.5 - 5.1 mmol/L   Chloride 102 98 - 111 mmol/L   CO2 22 22 - 32 mmol/L   Glucose, Bld 131 (H) 70 - 99 mg/dL    Comment: Glucose reference range applies only to samples taken after fasting for at least 8 hours.   BUN 13 8 - 23 mg/dL   Creatinine, Ser 0.92 0.44 - 1.00 mg/dL   Calcium 7.7 (L) 8.9 - 10.3 mg/dL   GFR, Estimated >60 >60 mL/min    Comment: (NOTE) Calculated using the CKD-EPI Creatinine Equation (2021)    Anion gap 6 5 - 15    Comment: Performed at [facility] [address] [place], [place] [address]  Troponin I (High Sensitivity)     Status: Abnormal   Collection Time: 10/24/20  8:14 AM  Result Value  Ref Range   Troponin I (High Sensitivity) 2,874 (HH) <18 ng/L    Comment: CRITICAL VALUE NOTED.  VALUE IS CONSISTENT WITH PREVIOUSLY REPORTED AND CALLED VALUE. (NOTE) Elevated high sensitivity troponin I (hsTnI) values and significant  changes across serial measurements may suggest ACS but many other  chronic and acute conditions are known to elevate hsTnI results.  Refer to the Links section for chest pain algorithms and additional  guidance. Performed at [facility], [place] [address], [place], [place] [address]   CBG monitoring, ED     Status: None   Collection Time: 10/24/20  9:16 AM  Result Value Ref Range   Glucose-Capillary 84 70 - 99 mg/dL    Comment: Glucose reference range applies only to samples taken after fasting for at least 8 hours.  Troponin I (High Sensitivity)     Status: Abnormal   Collection Time: 10/24/20 10:24 AM  Result Value Ref Range   Troponin I (High Sensitivity) 1,583 (HH) <18 ng/L    Comment: CRITICAL VALUE NOTED.  VALUE IS  CONSISTENT WITH PREVIOUSLY REPORTED AND CALLED VALUE. (NOTE) Elevated high sensitivity troponin I (hsTnI) values and significant  changes across serial measurements may suggest ACS but many other  chronic and acute conditions are known to elevate hsTnI results.  Refer to the Links section for chest pain algorithms and additional  guidance. Performed at [facility], [place] [address], [place],  [address]   CBG monitoring, ED     Status: None   Collection Time: 10/24/20 11:43 AM  Result Value Ref Range   Glucose-Capillary 91 70 - 99 mg/dL    Comment: Glucose reference range applies only to samples taken after fasting for at least 8 hours.   CT Head Wo Contrast  Result Date: 10/23/2020 CLINICAL DATA:  Mental status change, unknown cause EXAM: CT HEAD WITHOUT CONTRAST TECHNIQUE: Contiguous axial images were obtained from the base of the skull through the vertex without intravenous contrast. COMPARISON:  None. FINDINGS: Brain: No evidence of acute infarction, hemorrhage, hydrocephalus, extra-axial collection or mass lesion/mass effect. Moderate patchy white matter hypoattenuation, nonspecific but compatible with chronic microvascular ischemic disease. Mild for age atrophy with ex vacuo ventricular dilation. Vascular: No hyperdense vessel identified. Calcific intracranial atherosclerosis. Skull: No acute fracture. Sinuses/Orbits: Visualized sinuses are clear. Other: No mastoid effusions. IMPRESSION: No evidence of acute intracranial abnormality. Electronically Signed   By: Margaretha Sheffield M.D.   On: 10/23/2020 13:40   MR BRAIN WO CONTRAST  Result Date: 10/23/2020 CLINICAL DATA:  Weakness EXAM: MRI HEAD WITHOUT CONTRAST TECHNIQUE: Multiplanar, multiecho pulse sequences of the brain and surrounding structures were obtained without intravenous contrast. COMPARISON:  None. FINDINGS: Brain: No acute infarct, mass effect or extra-axial collection. No acute or chronic hemorrhage. There is  multifocal hyperintense T2-weighted signal within the white matter. Generalized volume loss without a clear lobar predilection. The midline structures are normal. Vascular: Major flow voids are preserved. Skull and upper cervical spine: Normal calvarium and skull base. Visualized upper cervical spine and soft tissues are normal. Sinuses/Orbits:No paranasal sinus fluid levels or advanced mucosal thickening. No mastoid or middle ear effusion. Normal orbits. IMPRESSION: 1. No acute intracranial abnormality. 2. Generalized volume loss and findings of chronic small vessel disease. Electronically Signed   By: Ulyses Jarred M.D.   On: 10/23/2020 23:04    Pending Labs Unresulted Labs (From admission, onward)     Start     Ordered   10/25/20 0500  Heparin level (unfractionated)  Daily,   R      10/24/20 0802   10/25/20 0500  CBC  Daily,   R      10/24/20 0802   10/25/20 0932  Basic metabolic panel  Tomorrow morning,   R        10/24/20 1129   10/24/20 1700  Heparin level (unfractionated)  Once-Timed,   TIMED        10/24/20 0802   10/23/20 2041  Sodium, urine, random  Once,   R        10/23/20 2040   10/23/20 2041  Osmolality, urine  Once,   R        10/23/20 2040   10/23/20 1214  Urinalysis, Routine w reflex microscopic Urine, Clean Catch  Once,   STAT        10/23/20 1213            Vitals/Pain Today's Vitals   10/24/20 1215 10/24/20 1300 10/24/20 1309 10/24/20 1315  BP:   (!) 104/57   Pulse:   82 84  Resp: 19 16 18 20   Temp:      TempSrc:      SpO2:  95% 100% 100%  Weight:      Height:      PainSc:        Isolation Precautions No active isolations  Medications Medications  aspirin EC tablet 81 mg (81 mg Oral Not Given 10/24/20 1336)  doxycycline (VIBRA-TABS) tablet 100 mg (100 mg Oral Not Given 10/24/20 1336)  atorvastatin (LIPITOR) tablet 10 mg (10 mg Oral Not Given 10/24/20 1337)  predniSONE (DELTASONE) tablet 40 mg (40 mg Oral Not Given 10/24/20 1331)  cycloSPORINE  (RESTASIS) 0.05 % ophthalmic emulsion 1 drop (1 drop Both Eyes Given 10/24/20 1007)  dorzolamide (TRUSOPT) 2 % ophthalmic solution 1 drop (1 drop Both Eyes Given 10/24/20 1008)  gatifloxacin (ZYMAXID) 0.5 % ophthalmic drops 1 drop (1 drop Both Eyes Given 10/24/20 1007)  artificial tears (LACRILUBE) ophthalmic ointment 1 application (has no administration in time range)  timolol (TIMOPTIC) 0.5 % ophthalmic solution 1 drop (1 drop Both Eyes Given 10/24/20 1008)  sodium chloride flush (NS) 0.9 % injection 3 mL (3 mLs Intravenous Not Given 10/24/20 1337)  acetaminophen (TYLENOL) tablet 650 mg (650 mg Oral Given 10/23/20 2152)    Or  acetaminophen (TYLENOL) suppository 650 mg ( Rectal See Alternative 10/23/20 2152)  0.9 %  sodium chloride infusion ( Intravenous New Bag/Given 10/24/20 1118)  insulin aspart (novoLOG) injection 0-9 Units (0 Units Subcutaneous Not Given 35/57/32 2025)  folic acid (FOLVITE) tablet 2 mg (2 mg Oral Not Given 10/24/20 1337)  antiseptic oral rinse (BIOTENE) solution 15 mL (has no administration in time range)  sodium chloride 0.9 % bolus 500 mL (0 mLs Intravenous Stopped 10/23/20 1854)  sodium chloride 0.9 % bolus 1,000 mL (0 mLs Intravenous Stopped 10/24/20 0512)  aspirin tablet 325 mg (325 mg Oral Given 10/24/20 0219)  magnesium sulfate IVPB 4 g 100 mL (0 g Intravenous Stopped 10/24/20 0501)    Mobility walker High fall risk   Focused Assessments    R Recommendations: See Admitting Provider Note  Report given to:   Additional Notes:

## 2020-10-24 NOTE — Hospital Course (Addendum)
#NSTEMI #Hypotension #Cold extremities on exam Patient did not endorse any chest pain or shortness of breath on admit. However, EKG on arrival showed nonspecific changes T wave inversions in V1 and V2 and possible Q waves in lead I and II. No prior EKG for comparison. Troponins 3.2K-->3.4K--> 2.8K.  A1c of 6.8. Cardiology was consulted, who recommended aspirin therapy without heparin, therefore patient was given 325 mg aspirin followed by 81 mg daily.  On hospital day 1, systolic blood pressures noted to be as low as the 80s.  She was given 1.5 L fluid boluses and subsequently placed on maintenance fluids.  Later that afternoon, systolic blood pressures improved to the 100s.  Also on exam, her right great toe was cool to touch and pedal pulses are palpable but weak. Concern for cardiogenic shock given hypotension and cool extremities on exam.  We will continue resuscitation with maintenance fluids and follow-up on her echocardiogram and vascular ABIs scheduled for today. Otherwise, cardiology is on board and will continue to follow their recommendations. - Cardiology consulted, appreciate their recommendations - Continue aspirin 81 mg daily - Trend troponins - IV NS @ 100 mL/hr - Echocardiogram pending - Vascular ABI's pending - Continue atorvastatin 10 mg p.o. daily   #Generalized weakness On arrival, patient endorsed more weakness in her right leg and normal though she has trouble lifting her legs at baseline. However, patient endorses noticeable recent change in weakness in the past day or so. Work-up including CT head and MRI brain have been unremarkable for acute changes.  Patient endorses 55 pound weight loss in the past year, and she states that she has a home health nurse come twice a week - appears that she is a [facility] patient, will reach out for more information. Otherwise, we have consulted nutrition today and will obtain PT/OT evals. - Nutrition consulted, appreciate their recs - UA pending,  urine sodium and osm pending - PT/OT evals, appreciate their recs   55 pound weight loss Lives alone In home aide services 2x weekly 194.8 now 125 pounds Doctor  Diabetes Doxycycline for corenal melt -10/4 bid Gaba Glucerna Losartan Mag oxide Mirtazapine Protonix Senna No insulin Prednisone - corneal melt 20 mg two tablets daily Dietician Palliative services -    #Heel ulcer, right Dry ulcer noted on the medial surface of her right heel. No purulence. Patient continues to be afebrile without elevation in WBC.  We will hold off on x-ray and antibiotics given low concern for osteomyelitis at this time.   #Diabetes mellitus Most recent A1c of 6.8.  Patient is on NovoLog 10 units in the evening per chart review, will home regimen clarify with the patient tomorrow. Most CBG of 84. -SSI, sensitive   #History of hypertension Patient is on hydrochlorothiazide 25 mg daily and losartan 50 mg daily at home.  However, these medications were held in setting of hypotension.   #Hyperlipidemia Patient is on atorvastatin 10 mg daily. Most recent lipid panel shows LDL 32, HDL 53.  She was continued on her atorvastatin for the rest of her hospitalization.  #H/o left corneal ulcer, right corneal scar #Glaucoma #Sjogren's syndrome Patient was followed by [facility] ophthalmology in the past.  Review of records shows that she was started on doxycycline and prednisone regimens in September 2021.  Will clarify home regimen with patient tomorrow. -Continue doxycycline 100 mg p.o. twice daily -Continue prednisone 40 mg p.o. daily -Zymaxid drops daily -Cyclosporine drops twice daily -Dorzolamide drops twice daily -Timolol drops  twice daily -Lacrilube ointment as needed

## 2020-10-24 NOTE — Progress Notes (Signed)
Cardiology Consultation:   Patient ID: Terry Alexander; [id number]; [birth year]   Admit date: 10/23/2020 Date of Consult: 10/24/2020  Primary Care Provider: [facility] [place] Primary Cardiologist: None  Primary Electrophysiologist:  None   Patient Profile:   Terry Alexander is a 80 y.o. female with a hx of diabetes and dyslipidemia who is being seen today for the evaluation of an elevated troponin and reduced EF at the request of Dr. Heber .  History of Present Illness:   Terry Alexander has no significant past cardiac history.   She presented because she had weakness and slid down to the floor and was unable to get up until her home health aide came.  She walks at baseline with a walker and had some increased weakness.  She does not report loss of consciousness or fall.  In the emergency room head CT demonstrated no acute findings.  MRI demonstrated no acute abnormalities.  Cardiac enzymes were obtained.  Peak was 3400.  It is trended down over the last several hours.  Other abnormal labs include hyperglycemia and a mildly low sodium.  She is hypomagnesemic as well.  She is anemic with a hemoglobin of 10.6.  A1c was 6.8.  An echocardiogram was obtained with results as below.  She tells me she was on the floor for about 4 hours.  She lives in senior living and she slid to the floor.  She said she did not have presyncope or syncope.  She just found herself being weak and not being able to get up off the floor.  She was wearing an alert necklace and also had alert devices on the wall but she did not think to hit either of them.  She said she was at a grocery store a few days before this able to walk around leaning on a cart.  However, now she has significant leg weakness.  She has not otherwise been describing anything such as chest pressure, neck or arm discomfort.  She has not been having any shortness of breath, PND or orthopnea.  She is having problems with her eyes but they  tell her "they are going to get better."   Past Medical History:  Diagnosis Date   Breast cancer ([place])    Right side   Diabetes ([place])    High cholesterol    Sjogren's syndrome ([place])     Past Surgical History:  Procedure Laterality Date   ABDOMINAL HYSTERECTOMY     MASTECTOMY Right [place]   Right breast removed    Tumor removed     Stomach     Home Medications:  Prior to Admission medications   Medication Sig Start Date End Date Taking? Authorizing Provider  acetaminophen (TYLENOL) 650 MG CR tablet Take 1,300 mg by mouth in the morning and at bedtime.   Yes [provider]  aspirin 81 MG tablet Take 81 mg by mouth daily.   Yes [provider]  atorvastatin (LIPITOR) 10 MG tablet Take 10 mg by mouth daily.   Yes [provider]  Cholecalciferol 10 MCG (400 UNIT) CAPS Take 1,000 Units by mouth daily.   Yes [provider]  cycloSPORINE (RESTASIS) 0.05 % ophthalmic emulsion Place 1 drop into both eyes 2 (two) times daily.   Yes [provider]  doxycycline (VIBRA-TABS) 100 MG tablet Take 100 mg by mouth 2 (two) times daily.   Yes [provider]  Emollient Jilda Panda  SERIOUSLY SENSITIVE) LOTN Apply 1 application topically 4 (four) times daily as needed (dry skin).   Yes [provider]  erythromycin ophthalmic ointment Place 1 application into the left eye 3 (three) times daily.   Yes [provider]  fexofenadine (ALLEGRA) 180 MG tablet Take 180 mg by mouth daily as needed for allergies.   Yes [provider]  folic acid (FOLVITE) 1 MG tablet Take 2 tablets (2 mg total) by mouth daily. 06/25/20  Yes Rice, Resa Miner, MD  gabapentin (NEURONTIN) 300 MG capsule Take 300 mg by mouth at bedtime.   Yes [provider]  lidocaine (LIDODERM) 5 % Place 1 patch onto the skin daily. Apple once daily to leg at night for leg pain   Yes [provider]  losartan  (COZAAR) 100 MG tablet Take 100 mg by mouth daily.   Yes [provider]  Magnesium 400 MG TABS Take 400 mg by mouth daily.   Yes [provider]  mirtazapine (REMERON) 15 MG tablet Take 15 mg by mouth at bedtime.   Yes [provider]  moxifloxacin (VIGAMOX) 0.5 % ophthalmic solution Place 1 drop into both eyes daily.   Yes [provider]  Nitroglycerin (RECTIV) 0.4 % OINT Place 1 application rectally in the morning and at bedtime.   Yes [provider]  Nutritional Supplements (GLUCERNA 1.5 CAL PO) Take 8 oz by mouth in the morning and at bedtime.   Yes [provider]  Ophthalmic Irrigation Solution (OCUSOFT EYE [place] OP) Apply 1 application to eye 4 (four) times daily as needed (eye exudate).   Yes [provider]  pantoprazole (PROTONIX) 40 MG tablet Protonix 40 mg tablet,delayed release  Take 1 tablet every day by oral route.   Yes [provider]  Polyethyl Glycol-Propyl Glycol (SYSTANE) 0.4-0.3 % GEL ophthalmic gel Place 1 application into both eyes at bedtime.   Yes [provider]  polyethylene glycol (MIRALAX / GLYCOLAX) 17 g packet Take 17 g by mouth daily as needed for mild constipation.   Yes [provider]  predniSONE (DELTASONE) 10 MG tablet Take 3 t x 7 days, 2 t x 7 days, 1 t x 7 days, then 1/2 t x 7 days (start on 09/27/20 end on 10/25/20)   Yes [provider]  predniSONE (DELTASONE) 20 MG tablet Take 40 mg by mouth daily with breakfast.   Yes [provider]  senna (SENOKOT) 8.6 MG TABS tablet Take 2 tablets by mouth at bedtime as needed for mild constipation.   Yes [provider]  simethicone (MYLICON) 80 MG chewable tablet Chew 80 mg by mouth 4 (four) times daily as needed for flatulence.   Yes [provider]  timolol (BETIMOL) 0.5 % ophthalmic solution Place 1 drop into both eyes 2 (two) times daily.   Yes [provider]  White  Petrolatum-Mineral Oil (SYSTANE NIGHTTIME) OINT Apply 1 application to eye 2 (two) times daily as needed (dry eye).   Yes [provider]  Ascorbic Acid (VITAMIN C) 1000 MG tablet Take 1,000 mg by mouth daily.    [provider]  dorzolamide (TRUSOPT) 2 % ophthalmic solution Place 1 drop into both eyes 2 (two) times daily.    [provider]  Insulin Pen Needle (NOVOFINE) 30G X 8 MM MISC Inject 1 packet into the skin as needed. Patient not taking: No sig reported    [provider]  Polyethyl Glycol-Propyl Glycol (SYSTANE OP) Apply 1  drop to eye daily as needed (dry eyes).    [provider]  pyridostigmine (MESTINON) 60 MG tablet Take 1 tablet (60 mg total) by mouth 3 (three) times daily. Patient not taking: No sig reported 04/24/14   Melvenia Beam, MD    Inpatient Medications: Scheduled Meds:  aspirin EC  81 mg Oral Daily   atorvastatin  10 mg Oral Daily   cycloSPORINE  1 drop Both Eyes BID   dorzolamide  1 drop Both Eyes BID   doxycycline  100 mg Oral BID   folic acid  2 mg Oral Daily   gatifloxacin  1 drop Both Eyes Daily   insulin aspart  0-9 Units Subcutaneous TID WC   predniSONE  40 mg Oral Q breakfast   sodium chloride flush  3 mL Intravenous Q12H   timolol  1 drop Both Eyes BID   Continuous Infusions:  sodium chloride 100 mL/hr at 10/24/20 1118   PRN Meds: acetaminophen **OR** acetaminophen, antiseptic oral rinse, artificial tears  Allergies:    Allergies  Allergen Reactions   Methotrexate Derivatives Other (See Comments)    Transaminitis, anemia, weight loss   Codeine Nausea And Vomiting    Social History:   Social History   Socioeconomic History   Marital status: Widowed    Spouse name: Not on file   Number of children: 1   Years of education: 2 yr colle   Highest education level: Not on file  Occupational History   Occupation: Retired   Tobacco Use   Smoking status: Former    Types: Cigarettes    Quit date:  01/14/1991    Years since quitting: 29.7   Smokeless tobacco: Never  Vaping Use   Vaping Use: Never used  Substance and Sexual Activity   Alcohol use: Yes    Comment: Wine every once in a while   Drug use: No   Sexual activity: Not on file  Other Topics Concern   Not on file  Social History Narrative   Lives at home alone.   Right handed.   Caffeine use: soft drinks (2-3 per day)   Social Determinants of Health   Financial Resource Strain: Not on file  Food Insecurity: Not on file  Transportation Needs: Not on file  Physical Activity: Not on file  Stress: Not on file  Social Connections: Not on file  Intimate Partner Violence: Not on file    Family History:    Family History  Problem Relation Age of Onset   Heart attack Mother    Prostate cancer Father    Heart attack Father    Heart attack Son      ROS:  Please see the history of present illness.  As stated in the HPI and negative for all other systems.  Physical Exam/Data:   Vitals:   10/24/20 1300 10/24/20 1309 10/24/20 1315 10/24/20 1615  BP:  (!) 104/57  102/62  Pulse:  82 84 88  Resp: 16 18 20 20   Temp:    (!) 97.5 F (36.4 C)  TempSrc:    Oral  SpO2: 95% 100% 100% 99%  Weight:      Height:         Intake/Output Summary (Last 24 hours) at 10/24/2020 1717 Last data filed at 10/24/2020 0512 Gross per 24 hour  Intake 1117.94 ml  Output --  Net 1117.94 ml   Filed Weights   10/24/20 0733  Weight: 56.7 kg   Body mass index is 22.14  kg/m.  GENERAL: Frail appearing appearing HEENT:  Fundi not visualized, oral mucosa unremarkable NECK:  No  jugular venous distention, waveform within normal limits, carotid upstroke brisk and symmetric, no bruits, no thyromegaly LYMPHATICS:  No cervical, inguinal adenopathy LUNGS:   Clear to auscultation bilaterally BACK:  No CVA tenderness CHEST:   Unremarkable HEART:  PMI not displaced or sustained,S1 and S2 within normal limits, no S3, no S4, no clicks, no  rubs, no murmurs ABD:  Flat, positive bowel sounds normal in frequency in pitch, no bruits, no rebound, no guarding, no midline pulsatile mass, no hepatomegaly, no splenomegaly EXT:  2 plus pulses throughout, no no edema, no cyanosis no clubbing SKIN:  No rashes no nodules NEURO: Bilateral lower extremity weakness PSYCH:    Cognitively intact, oriented to person place and time   EKG:  The EKG was personally reviewed and demonstrates: Normal sinus rhythm, rate 85, axis within normal limits, intervals within normal limits, poor anterior R wave progression.  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  Low voltage in the limb leads no old EKGs for comparison.  Telemetry:  Telemetry was personally reviewed and demonstrates: The patient was off tele and I could not retrieve any tracings   Relevant CV Studies:   10/24/2020  Echo   1. Septal and apical akinesis preserved mid and basal function Prominent  trabeculations in apex consider f/u contrast study to r/o thormbus. Left  ventricular ejection fraction, by estimation, is 45 to 50%. The left  ventricle has mildly decreased  function. The left ventricle has no regional wall motion abnormalities.  The left ventricular internal cavity size was mildly dilated. Left  ventricular diastolic parameters are consistent with Grade I diastolic  dysfunction (impaired relaxation).   2. Right ventricular systolic function is normal. The right ventricular  size is normal. There is normal pulmonary artery systolic pressure.   3. Left atrial size was moderately dilated.   4. The mitral valve is normal in structure. Mild mitral valve  regurgitation. No evidence of mitral stenosis.   5. The aortic valve was not well visualized. Aortic valve regurgitation  is not visualized. Mild to moderate aortic valve sclerosis/calcification  is present, without any evidence of aortic stenosis.   6. The inferior vena cava is normal in size with greater than 50%  respiratory  variability, suggesting right atrial pressure of 3 mmHg.   Laboratory Data:  Chemistry Recent Labs  Lab 10/23/20 1214 10/23/20 2359 10/24/20 0447  NA 127* 131* 130*  K 5.4* 4.3 3.7  CL 98 102 102  CO2 18* 20* 22  GLUCOSE 209* 203* 131*  BUN 19 17 13   CREATININE 1.07* 0.99 0.92  CALCIUM 9.0 8.3* 7.7*  GFRNONAA 52* 57* >60  ANIONGAP 11 9 6     Recent Labs  Lab 10/23/20 1214  PROT 6.2*  ALBUMIN 2.6*  AST 61*  ALT 41  ALKPHOS 103  BILITOT 1.2   Hematology Recent Labs  Lab 10/23/20 1214 10/23/20 2359 10/24/20 0447  WBC 10.9* 9.6 8.2  RBC 3.56* 3.19* 2.85*  HGB 12.0 10.6* 9.4*  HCT 35.9* 32.1* 27.9*  MCV 100.8* 100.6* 97.9  MCH 33.7 33.2 33.0  MCHC 33.4 33.0 33.7  RDW 13.3 13.6 13.6  PLT 188 206 226   Cardiac EnzymesNo results for input(s): TROPONINI in the last 168 hours. No results for input(s): TROPIPOC in the last 168 hours.  BNPNo results for input(s): BNP, PROBNP in the last 168 hours.  DDimer No results for input(s): DDIMER in the  last 168 hours.  Radiology/Studies:  CT Head Wo Contrast  Result Date: 10/23/2020 CLINICAL DATA:  Mental status change, unknown cause EXAM: CT HEAD WITHOUT CONTRAST TECHNIQUE: Contiguous axial images were obtained from the base of the skull through the vertex without intravenous contrast. COMPARISON:  None. FINDINGS: Brain: No evidence of acute infarction, hemorrhage, hydrocephalus, extra-axial collection or mass lesion/mass effect. Moderate patchy white matter hypoattenuation, nonspecific but compatible with chronic microvascular ischemic disease. Mild for age atrophy with ex vacuo ventricular dilation. Vascular: No hyperdense vessel identified. Calcific intracranial atherosclerosis. Skull: No acute fracture. Sinuses/Orbits: Visualized sinuses are clear. Other: No mastoid effusions. IMPRESSION: No evidence of acute intracranial abnormality. Electronically Signed   By: Margaretha Sheffield M.D.   On: 10/23/2020 13:40   MR BRAIN WO  CONTRAST  Result Date: 10/23/2020 CLINICAL DATA:  Weakness EXAM: MRI HEAD WITHOUT CONTRAST TECHNIQUE: Multiplanar, multiecho pulse sequences of the brain and surrounding structures were obtained without intravenous contrast. COMPARISON:  None. FINDINGS: Brain: No acute infarct, mass effect or extra-axial collection. No acute or chronic hemorrhage. There is multifocal hyperintense T2-weighted signal within the white matter. Generalized volume loss without a clear lobar predilection. The midline structures are normal. Vascular: Major flow voids are preserved. Skull and upper cervical spine: Normal calvarium and skull base. Visualized upper cervical spine and soft tissues are normal. Sinuses/Orbits:No paranasal sinus fluid levels or advanced mucosal thickening. No mastoid or middle ear effusion. Normal orbits. IMPRESSION: 1. No acute intracranial abnormality. 2. Generalized volume loss and findings of chronic small vessel disease. Electronically Signed   By: Ulyses Jarred M.D.   On: 10/23/2020 23:04   VAS [place] ABI WITH/WO TBI  Result Date: 10/24/2020  LOWER EXTREMITY DOPPLER STUDY Patient Name:  Terry Alexander  Date of Exam:   10/24/2020 Medical Rec #: [id number]    Accession #:    [id number] Date of Birth: [birth year]    Patient Gender: F Patient Age:   55 years Exam Location:  [facility] Procedure:      VAS [place] ABI WITH/WO TBI Referring Phys: ERIK HOFFMAN --------------------------------------------------------------------------------  Indications: Ulceration, and Weak pulses on palpation.  Limitations: Today's exam was limited due to skin changes resulting in poor              ultrasound/tissue interface. Comparison Study: No prior studies. Performing Technologist: Darlin Coco RDMS RVT  Examination Guidelines: A complete evaluation includes at minimum, Doppler waveform signals and systolic blood pressure reading at the level of bilateral brachial, anterior tibial, and posterior tibial arteries, when vessel  segments are accessible. Bilateral testing is considered an integral part of a complete examination. Photoelectric Plethysmograph (PPG) waveforms and toe systolic pressure readings are included as required and additional duplex testing as needed. Limited examinations for reoccurring indications may be performed as noted.  ABI Findings: +--------+------------------+-----+---------+--------+ Right   Rt Pressure (mmHg)IndexWaveform Comment  +--------+------------------+-----+---------+--------+ [id number]                    triphasic         +--------+------------------+-----+---------+--------+ PTA     116               1.06 biphasic          +--------+------------------+-----+---------+--------+ DP      124               1.14 triphasic         +--------+------------------+-----+---------+--------+ +--------+------------------+-----+----------------+---------------------------+ Left    Lt Pressure (mmHg)IndexWaveform  Comment                     +--------+------------------+-----+----------------+---------------------------+ [id number]                    triphasic                                   +--------+------------------+-----+----------------+---------------------------+ PTA     113               1.04 audibly biphasicSuboptimal signal due to                                                   skin changes.               +--------+------------------+-----+----------------+---------------------------+ DP      111               1.02 triphasic                                   +--------+------------------+-----+----------------+---------------------------+ +-------+-----------+-----------+------------+------------+ ABI/TBIToday's ABIToday's TBIPrevious ABIPrevious TBI +-------+-----------+-----------+------------+------------+ Right  1.14                                           +-------+-----------+-----------+------------+------------+ Left    1.04                                           +-------+-----------+-----------+------------+------------+  Summary: Right: Resting right ankle-brachial index is within normal range. No evidence of significant right lower extremity arterial disease. Left: Resting left ankle-brachial index is within normal range. No evidence of significant left lower extremity arterial disease.  *See table(s) above for measurements and observations.     Preliminary    ECHOCARDIOGRAM COMPLETE  Result Date: 10/24/2020    ECHOCARDIOGRAM REPORT   Patient Name:   Terry Alexander Date of Exam: 10/24/2020 Medical Rec #:  [id number]   Height:       63.0 in Accession #:    [id number]  Weight:       125.0 lb Date of Birth:  [birth year]   BSA:          1.584 m Patient Age:    3 years    BP:           104/57 mmHg Patient Gender: F           HR:           84 bpm. Exam Location:  Inpatient Procedure: 2D Echo, Cardiac Doppler and Color Doppler Indications:    Abnormal EKG  History:        Patient has no prior history of Echocardiogram examinations.                 Risk Factors:Diabetes and Dyslipidemia.  Sonographer:    Wenda Low Referring Phys: [place]  1. Septal and apical akinesis preserved mid and basal function Prominent trabeculations in apex consider f/u contrast study to r/o thormbus. Left  ventricular ejection fraction, by estimation, is 45 to 50%. The left ventricle has mildly decreased function. The left ventricle has no regional wall motion abnormalities. The left ventricular internal cavity size was mildly dilated. Left ventricular diastolic parameters are consistent with Grade I diastolic dysfunction (impaired relaxation).  2. Right ventricular systolic function is normal. The right ventricular size is normal. There is normal pulmonary artery systolic pressure.  3. Left atrial size was moderately dilated.  4. The mitral valve is normal in structure. Mild mitral valve regurgitation. No evidence of  mitral stenosis.  5. The aortic valve was not well visualized. Aortic valve regurgitation is not visualized. Mild to moderate aortic valve sclerosis/calcification is present, without any evidence of aortic stenosis.  6. The inferior vena cava is normal in size with greater than 50% respiratory variability, suggesting right atrial pressure of 3 mmHg. FINDINGS  Left Ventricle: Septal and apical akinesis preserved mid and basal function Prominent trabeculations in apex consider f/u contrast study to r/o thormbus. Left ventricular ejection fraction, by estimation, is 45 to 50%. The left ventricle has mildly decreased function. The left ventricle has no regional wall motion abnormalities. The left ventricular internal cavity size was mildly dilated. There is no left ventricular hypertrophy. Left ventricular diastolic parameters are consistent with Grade I diastolic dysfunction (impaired relaxation). Right Ventricle: The right ventricular size is normal. No increase in right ventricular wall thickness. Right ventricular systolic function is normal. There is normal pulmonary artery systolic pressure. The tricuspid regurgitant velocity is 2.10 m/s, and  with an assumed right atrial pressure of 3 mmHg, the estimated right ventricular systolic pressure is 96.7 mmHg. Left Atrium: Left atrial size was moderately dilated. Right Atrium: Right atrial size was normal in size. Pericardium: There is no evidence of pericardial effusion. Mitral Valve: The mitral valve is normal in structure. Mild mitral valve regurgitation. No evidence of mitral valve stenosis. MV peak gradient, 2.3 mmHg. The mean mitral valve gradient is 1.0 mmHg. Tricuspid Valve: The tricuspid valve is normal in structure. Tricuspid valve regurgitation is trivial. No evidence of tricuspid stenosis. Aortic Valve: The aortic valve was not well visualized. Aortic valve regurgitation is not visualized. Mild to moderate aortic valve sclerosis/calcification is present,  without any evidence of aortic stenosis. Aortic valve mean gradient measures 3.0 mmHg.  Aortic valve peak gradient measures 5.6 mmHg. Aortic valve area, by VTI measures 2.25 cm. Pulmonic Valve: The pulmonic valve was normal in structure. Pulmonic valve regurgitation is not visualized. No evidence of pulmonic stenosis. Aorta: The aortic root is normal in size and structure. Venous: The inferior vena cava is normal in size with greater than 50% respiratory variability, suggesting right atrial pressure of 3 mmHg. IAS/Shunts: No atrial level shunt detected by color flow Doppler.  LEFT VENTRICLE PLAX 2D LVIDd:         3.20 cm     Diastology LVIDs:         2.40 cm     LV e' medial:    3.70 cm/s LV PW:         1.00 cm     LV E/e' medial:  9.9 LV IVS:        1.10 cm     LV e' lateral:   6.31 cm/s LVOT diam:     1.90 cm     LV E/e' lateral: 5.8 LV SV:         50 LV SV Index:   32 LVOT Area:     2.84 cm  LV Volumes (MOD) LV vol d, MOD A2C: 32.7 ml LV vol d, MOD A4C: 59.8 ml LV vol s, MOD A2C: 24.8 ml LV vol s, MOD A4C: 22.5 ml LV SV MOD A2C:     7.9 ml LV SV MOD A4C:     59.8 ml LV SV MOD BP:      20.8 ml RIGHT VENTRICLE RV Basal diam:  2.50 cm RV Mid diam:    2.10 cm LEFT ATRIUM             Index        RIGHT ATRIUM           Index LA diam:        4.30 cm 2.72 cm/m   RA Area:     11.70 cm LA Vol (A2C):   36.2 ml 22.86 ml/m  RA Volume:   20.70 ml  13.07 ml/m LA Vol (A4C):   42.1 ml 26.59 ml/m LA Biplane Vol: 41.0 ml 25.89 ml/m  AORTIC VALVE                    PULMONIC VALVE AV Area (Vmax):    2.07 cm     PV Vmax:       1.01 m/s AV Area (Vmean):   2.23 cm     PV Peak grad:  4.1 mmHg AV Area (VTI):     2.25 cm AV Vmax:           118.00 cm/s AV Vmean:          72.500 cm/s AV VTI:            0.224 m AV Peak Grad:      5.6 mmHg AV Mean Grad:      3.0 mmHg LVOT Vmax:         86.20 cm/s LVOT Vmean:        57.000 cm/s LVOT VTI:          0.178 m LVOT/AV VTI ratio: 0.79  AORTA Ao Root diam: 2.90 cm Ao Asc diam:  2.50 cm  MITRAL VALVE               TRICUSPID VALVE MV Area (PHT): 4.99 cm    TR Peak grad:   17.6 mmHg MV Area VTI:   4.11 cm    TR Vmax:        210.00 cm/s MV Peak grad:  2.3 mmHg MV Mean grad:  1.0 mmHg    SHUNTS MV Vmax:       0.76 m/s    Systemic VTI:  0.18 m MV Vmean:      38.8 cm/s   Systemic Diam: 1.90 cm MV Decel Time: 152 msec MV E velocity: 36.80 cm/s MV A velocity: 71.10 cm/s MV E/A ratio:  0.52 Jenkins Rouge MD Electronically signed by Jenkins Rouge MD Signature Date/Time: 10/24/2020/4:24:10 PM    Final     Assessment and Plan:   CARDIOMYOPATHY:    She does have a mildly reduced ejection 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  However, the enzymes were not necessarily a pattern that would suggest this was a precipitating event for her hospitalization.  I do think that she ultimately would benefit from an ischemia work-up I would likely recommend CT angiography.  However, she is currently without symptoms and very frail.  For now she can have medical management and I will consider the timing of CT.  Her blood pressure is labile and will not  allow med titration at this point.  She said she was on losartan previously.  I likely would try if her blood pressure allows to use Entresto.  We could then titrate meds slowly over time.  ELEVATED TROPONIN: As above  ULCER RIGHT HEEL: ABIs are pending.  She is not sure how long its been there as she cannot see the foot.  Its not clear that this was recent from her episode of lying on the floor.  DM: She said she has had 50 years of diabetes.  Management per the primary team and PCP.  DYSLIPIDEMIA:    She has an excellent lipid profile.  I would continue the low-dose of Lipitor that she was on previously.  For questions or updates, please contact [place] Please consult [email or web address] for contact info under Cardiology/STEMI.   Signed, Minus Breeding, MD  10/24/2020 5:17 PM

## 2020-10-24 NOTE — Progress Notes (Addendum)
Subjective: Patient was seen at bedside rounds this morning.  She states that she had a decent night overall.  Denies chest pain and shortness of breath.  States that her generalized weakness started last Tuesday.  Denies other complaints or concerns today.  Objective:  Vital signs in last 24 hours: Vitals:   10/24/20 0600 10/24/20 0608 10/24/20 0645 10/24/20 0733  BP: (!) 91/51 (!) 93/52 (!) 91/52   Pulse: 69     Resp: 12  13   Temp:      TempSrc:      SpO2: 100%     Weight:    56.7 kg  Height:    5\' 3"  (1.6 m)    General: Ill-appearing, in no acute distress HE: Normocephalic, atraumatic.  Keratoconjunctivitis. Able to open right eye, only slightly opens left eye on command. ENT: No congestion, no rhinorrhea, no exudate or erythema. Mucous membranes are dry. Cardiovascular: Normal rate, regular rhythm. No murmurs, rubs, or gallops.  Bilateral pedal pulses are intact but weak. Pulmonary: Effort normal, breath sounds normal. No wheezes, rales, or rhonchi Abdominal: soft, nontender, non-distended Musculoskeletal: no swelling, deformity, injury or tenderness in extremities Skin: Warm, dry.  There is a 3 x 4 cm ulcer on the medial aspect of the heel, no purulence or drainage noted.  No active bleeding.  Left great toe is cool to touch.  Right foot is warm to touch.  BUE: Warm to touch. Neurologic exam: Mental status: A&Ox3 Cranial Nerves:             V, VII: Face symmetric, sensation intact in all 3 divisions               IX, X: palate rises symmetrically             XI: Shoulder shrug normal bilaterally               XII: tongue midline    Motor: Bulk muscle and tone are normal.  Strength 5/5 in BUE. Strength 2/5 in RLE, Strength 2/5 in LLE, poor effort noted.  With strength exam, patient endorses stiffness in BLE after her fall. Sensory: Light touch intact and symmetric bilaterally  Psychiatric: Normal mood and affect   Assessment/Plan:  Principal Problem:   Generalized  weakness Active Problems:   Sjogren syndrome with keratoconjunctivitis (HCC)  #NSTEMI #Hypotension #Cold extremities on exam Patient continues to deny chest pain or shortness of breath.  However, EKG on arrival showed nonspecific changes T wave inversions in V1 and V2 and possible Q waves in lead I and II. No prior EKG for comparison. Troponins 3.2K-->3.4K--> 2.8K.  A1c of 6.8. Cardiology was consulted, at this time we will continue aspirin therapy without heparin.  Since arrival, blood pressures have been downtrending down to the 40C systolic. She was given 1.5 L fluid bolus and is now on maintenance fluids with slight improvement of blood pressures to 14G systolic, most recently 81/85. On exam, her right great toe is cool to touch and pedal pulses are palpable but weak. Concern for cardiogenic shock given hypotension and cool extremities on exam.  We will continue resuscitation with maintenance fluids and follow-up on her echocardiogram and vascular ABIs scheduled for today. Otherwise, cardiology is on board and will continue to follow their recommendations. - Cardiology consulted, appreciate their recommendations - Continue aspirin 81 mg daily - Trend troponins - IV NS @ 100 mL/hr - Echocardiogram pending - Vascular ABI's pending - Continue atorvastatin 10 mg p.o. daily   #  Generalized weakness On arrival, patient endorsed more weakness in her right leg and normal though she has trouble lifting her legs at baseline. However, patient endorses noticeable recent change in weakness in the past day or so. Work-up including CT head and MRI brain have been unremarkable for acute changes.  Patient endorses 55 pound weight loss in the past year, and she states that she has a home health nurse come twice a week.  This was all confirmed by staff at [facility]. Otherwise, we have consulted nutrition today and will obtain PT/OT evals. - Nutrition consulted, appreciate their recs - UA pending, urine sodium and osm  pending - PT/OT evals, appreciate their recs   #Heel ulcer, right Dry ulcer noted on the medial surface of her right heel. No purulence. Patient continues to be afebrile without elevation in WBC.  We will hold off on x-ray and antibiotics given low concern for osteomyelitis at this time.  #Diabetes mellitus Most recent A1c of 6.8.  Per [facility], patient is not on insulin at home.  Most CBG of 84. -SSI, sensitive   #History of hypertension Patient is on hydrochlorothiazide 25 mg daily and losartan 50 mg daily at home.   -Holding home HCTZ and losartan in the setting of hypotension.   #Hyperlipidemia Patient is on atorvastatin 10 mg daily. Most recent lipid panel today shows LDL 32, HDL 53. -Continue atorvastatin 10 mg p.o. daily  #H/o left corneal ulcer, right corneal scar #Glaucoma #Sjogren's syndrome Patient was followed by [facility] ophthalmology in the past.  Has been on doxycycline and prednisone regimens for her corneal melt. -Continue doxycycline 100 mg p.o. twice daily -Continue prednisone 40 mg p.o. daily -Zymaxid drops daily -Cyclosporine drops twice daily -Dorzolamide drops twice daily -Timolol drops twice daily -Lacrilube ointment as needed  Prior to Admission Living Arrangement: Home Barriers to Discharge: Medical management of NSTEMI, hypertension Dispo: Anticipated discharge pending evaluation and work-up of NSTEMI  Orvis Brill, MD 10/24/2020, 8:17 AM Pager: [phone]  After 5pm on weekdays and 1pm on weekends: On Call pager [phone]

## 2020-10-24 NOTE — ED Notes (Signed)
Pt incontinent of urine; brief changed, pure wick repositioned

## 2020-10-24 NOTE — Progress Notes (Signed)
PT Cancellation Note  Patient Details Name: KYNADIE YAUN MRN: [id number] DOB: 04/02/1940   Cancelled Treatment:    Reason Eval/Treat Not Completed: Medical issues which prohibited therapy Pt with elevated troponin and NSTEMI , not evaluated by cardiology yet.  Spoke with RN who request holding PT/OT today.  PT agrees.  Will f/u at later date.  Mikael Spray Ardys Hataway 10/24/2020, 11:35 AM

## 2020-10-24 NOTE — Progress Notes (Signed)
*  PRELIMINARY RESULTS* Echocardiogram 2D Echocardiogram has been performed.  Terry Alexander 10/24/2020, 4:02 PM

## 2020-10-24 NOTE — Progress Notes (Signed)
OT Cancellation Note  Patient Details Name: Terry Alexander MRN: [id number] DOB: [birth year]   Cancelled Treatment:    Reason Eval/Treat Not Completed: Patient not medically ready. PT able to speak with RN, and advised to hold this date based on current medical status.  OT to continues efforts in the acute setting as appropriate.    Quirino Kakos D Lonny Eisen 10/24/2020, 11:00 AM

## 2020-10-25 LAB — BASIC METABOLIC PANEL
Anion gap: 11 (ref 5–15)
BUN: 12 mg/dL (ref 8–23)
CO2: 16 mmol/L — ABNORMAL LOW (ref 22–32)
Calcium: 7.8 mg/dL — ABNORMAL LOW (ref 8.9–10.3)
Chloride: 106 mmol/L (ref 98–111)
Creatinine, Ser: 0.92 mg/dL (ref 0.44–1.00)
GFR, Estimated: >60 mL/min (ref >60)
Glucose, Bld: 116 mg/dL — ABNORMAL HIGH (ref 70–99)
Potassium: 3.8 mmol/L (ref 3.5–5.1)
Sodium: 133 mmol/L — ABNORMAL LOW (ref 135–145)

## 2020-10-25 LAB — GLUCOSE, CAPILLARY
Glucose-Capillary: 100 mg/dL — ABNORMAL HIGH (ref 70–99)
Glucose-Capillary: 151 mg/dL — ABNORMAL HIGH (ref 70–99)
Glucose-Capillary: 183 mg/dL — ABNORMAL HIGH (ref 70–99)

## 2020-10-25 LAB — CBC
HCT: 31.1 % — ABNORMAL LOW (ref 36.0–46.0)
Hemoglobin: 10.7 g/dL — ABNORMAL LOW (ref 12.0–15.0)
MCH: 33.4 pg (ref 26.0–34.0)
MCHC: 34.4 g/dL (ref 30.0–36.0)
MCV: 97.2 fL (ref 80.0–100.0)
Platelets: 255 10*3/uL (ref 150–400)
RBC: 3.2 MIL/uL — ABNORMAL LOW (ref 3.87–5.11)
RDW: 14.2 % (ref 11.5–15.5)
WBC: 9.5 10*3/uL (ref 4.0–10.5)
nRBC: 0 % (ref 0.0–0.2)

## 2020-10-25 LAB — HEPARIN LEVEL (UNFRACTIONATED): Heparin Unfractionated: <0.1 IU/mL — ABNORMAL LOW (ref 0.30–0.70)

## 2020-10-25 LAB — TSH: TSH: 3.369 u[IU]/mL (ref 0.350–4.500)

## 2020-10-25 MED ORDER — ASCORBIC ACID 500 MG PO TABS
1000.0000 mg | ORAL_TABLET | Freq: Every day | ORAL | Status: DC
  Administered 2020-10-25 – 2020-10-26 (×2): 1000 mg via ORAL
  Filled 2020-10-25 (×2): qty 2

## 2020-10-25 MED ORDER — ERYTHROMYCIN 5 MG/GM OP OINT
1.0000 "application " | TOPICAL_OINTMENT | Freq: Four times a day (QID) | OPHTHALMIC | Status: DC
  Administered 2020-10-25 – 2020-10-26 (×3): 1 via OPHTHALMIC
  Filled 2020-10-25: qty 3.5

## 2020-10-25 MED ORDER — QUETIAPINE FUMARATE 25 MG PO TABS
25.0000 mg | ORAL_TABLET | Freq: Every day | ORAL | Status: DC
  Administered 2020-10-25: 25 mg via ORAL
  Filled 2020-10-25: qty 1

## 2020-10-25 NOTE — Plan of Care (Signed)
  Problem: Clinical Measurements: Goal: Ability to maintain clinical measurements within normal limits will improve Outcome: Progressing Goal: Will remain free from infection Outcome: Progressing Goal: Diagnostic test results will improve Outcome: Progressing Goal: Respiratory complications will improve Outcome: Progressing Goal: Cardiovascular complication will be avoided Outcome: Progressing   Problem: Nutrition: Goal: Adequate nutrition will be maintained Outcome: Progressing   Problem: Elimination: Goal: Will not experience complications related to bowel motility Outcome: Progressing Goal: Will not experience complications related to urinary retention Outcome: Progressing   Problem: Safety: Goal: Ability to remain free from injury will improve Outcome: Progressing

## 2020-10-25 NOTE — Progress Notes (Signed)
OT Cancellation Note  Patient Details Name: BYANKA LANDRUS MRN: [id number] DOB: [birth year]   Cancelled Treatment:    Reason Eval/Treat Not Completed: Patient not medically ready.  Discussed with PA.  Will hold until cleared fully by cards.  Nilsa Nutting., OTR/L Acute Rehabilitation Services Pager [phone] Office [phone]   Lucille Passy M 10/25/2020, 11:04 AM

## 2020-10-25 NOTE — Evaluation (Signed)
Physical Therapy Evaluation Patient Details Name: Terry Alexander MRN: [id number] DOB: [birth year] Today's Date: 10/25/2020  History of Present Illness  Patient is an 80 year old female with a history of HTN, HLD, DM, Sjogrens, and breast CA s/p mastectomy admitted after found by aide after slid in floor from toilet and stayed 4 hours.  Work up under way for NSTEMI.  Clinical Impression  Patient presents with decreased mobility due to weakness, decreased activity tolerance with R LE pain, decreased balance and decreased cognition.  Evidently was living independent prior to this admission.  She used a [employer] practitioner and went to [place] 2 days a week.  She feels she will need SNF level rehab at d/c.  PT will follow acutely.     Recommendations for follow up therapy are one component of a multi-disciplinary discharge planning process, led by the attending physician.  Recommendations may be updated based on patient status, additional functional criteria and insurance authorization.  Follow Up Recommendations SNF    Equipment Recommendations  None recommended by PT    Recommendations for Other Services       Precautions / Restrictions Precautions Precautions: Fall      Mobility  Bed Mobility Overal bed mobility: Needs Assistance Bed Mobility: Rolling;Supine to Sit;Sit to Supine Rolling: Max assist;+2 for physical assistance   Supine to sit: Mod assist;HOB elevated Sit to supine: Max assist   General bed mobility comments: assist to sit up with HHA then scooting hips wit pad under her, patient sit to supine attempted to cue to go to sidelying, when PT lifting her feet she fell back and hit back of her head on the rail.  Then assist to place legs into bed and scoot shoulders over, Nursing help to roll in bed for hygiene and place new pure wick and position for comfort.    Transfers                 General transfer comment: NT due to pain R LE  Ambulation/Gait                 Stairs            Wheelchair Mobility    Modified Rankin (Stroke Patients Only)       Balance Overall balance assessment: Needs assistance Sitting-balance support: Feet unsupported;No upper extremity supported Sitting balance-Leahy Scale: Fair Sitting balance - Comments: sat EOB once place with S no LOB able to move arms to use tissues to wipe her eyes and throw soiled tissues in trash                                     Pertinent Vitals/Pain Pain Assessment: Faces Faces Pain Scale: Hurts even more Pain Location: L LE with movement Pain Descriptors / Indicators: Grimacing;Guarding;Aching Pain Intervention(s): Monitored during session;Repositioned;Limited activity within patient's tolerance    Home Living Family/patient expects to be discharged to:: Private residence Living Arrangements: Alone   Type of Home: Apartment Home Access: Elevator     Home Layout: One level Home Equipment: Shower seat;Grab bars - tub/shower;Grab bars - toilet;Walker - 4 wheels      Prior Function Level of Independence: Independent with assistive device(s)         Comments: goes to [facility] 2[facility] days a week and has housekeeper, can get her own meals; has eye disease and her vision is worse, but still trying to do some cooking though  seems to realize this is risky with her vision     Hand Dominance   Dominant Hand: Right    Extremity/Trunk Assessment   Upper Extremity Assessment Upper Extremity Assessment: Defer to OT evaluation    Lower Extremity Assessment Lower Extremity Assessment: RLE deficits/detail;LLE deficits/detail RLE Deficits / Details: painful with AAROM and knee flexion limited about 45 degrees in supine, noted heel ulcer covered with kerlix and draining to sheets underneath RLE Sensation: decreased light touch (numbness in feet) LLE Deficits / Details: AROM generally WFL, strength at lest 3+/5 knee extension LLE Sensation: decreased light touch  (numbeness in feet)    Cervical / Trunk Assessment Cervical / Trunk Assessment: Kyphotic  Communication   Communication: No difficulties  Cognition Arousal/Alertness: Awake/alert Behavior During Therapy: WFL for tasks assessed/performed Overall Cognitive Status: No family/caregiver present to determine baseline cognitive functioning Area of Impairment: Orientation;Following commands;Problem solving                 Orientation Level: Disoriented to;Time;Situation     Following Commands: Follows one step commands with increased time;Follows one step commands consistently     Problem Solving: Slow processing;Difficulty sequencing;Requires tactile cues;Requires verbal cues        General Comments      Exercises     Assessment/Plan    PT Assessment Patient needs continued PT services  PT Problem List Decreased strength;Decreased mobility;Decreased activity tolerance;Decreased balance;Decreased safety awareness;Decreased cognition;Decreased knowledge of use of DME;Pain;Impaired sensation       PT Treatment Interventions DME instruction;Therapeutic activities;Therapeutic exercise;Patient/family education;Gait training;Balance training;Functional mobility training    PT Goals (Current goals can be found in the Care Plan section)  Acute Rehab PT Goals Patient Stated Goal: agreeable to rehab, knows she cannot care for herself safely PT Goal Formulation: With patient Time For Goal Achievement: 10/25/20 Potential to Achieve Goals: Fair    Frequency Min 2X/week   Barriers to discharge Decreased caregiver support      Co-evaluation               AM-PAC PT "6 Clicks" Mobility  Outcome Measure Help needed turning from your back to your side while in a flat bed without using bedrails?: Total Help needed moving from lying on your back to sitting on the side of a flat bed without using bedrails?: Total Help needed moving to and from a bed to a chair (including a  wheelchair)?: Total Help needed standing up from a chair using your arms (e.g., wheelchair or bedside chair)?: Total Help needed to walk in hospital room?: Total Help needed climbing 3-5 steps with a railing? : Total 6 Click Score: 6    End of Session   Activity Tolerance: Patient limited by pain Patient left: in bed;with call bell/phone within reach;with bed alarm set Nurse Communication: Other (comment) (fell back in sitting and hit head on the bed rail) PT Visit Diagnosis: Muscle weakness (generalized) (M62.81);Other abnormalities of gait and mobility (R26.89)    Time: 2440-1027 PT Time Calculation (min) (ACUTE ONLY): 29 min   Charges:   PT Evaluation $PT Eval Moderate Complexity: 1 Mod PT Treatments $Therapeutic Activity: 8-22 mins        Magda Kiel, PT Acute Rehabilitation Services OZDGU:[phone] [phone] 10/25/2020   Terry Alexander 10/25/2020, 11:46 AM

## 2020-10-25 NOTE — Progress Notes (Signed)
Called patient's sister Terry Alexander to see if she could call patient's room because patient has been asking every time staff is in the room to call family, but patient's room phone does not dial out to any of family's number. Patient does not remember speaking to her brother this morning, or that staff has attempted to call family for her on her room phone and that her phone doesn't call out to them.

## 2020-10-25 NOTE — Progress Notes (Addendum)
Progress Note  Patient Name: Terry Alexander Date of Encounter: 10/25/2020  Primary Cardiologist: Minus Breeding, MD  Subjective   Pt is upset this AM because her room phone is not calling out correctly to certain 191 numbers. Per nurse report, doctor is going to have brother call into her room so they can talk. No CP or SOB.  Inpatient Medications    Scheduled Meds:  aspirin EC  81 mg Oral Daily   atorvastatin  10 mg Oral Daily   cycloSPORINE  1 drop Both Eyes BID   dorzolamide  1 drop Both Eyes BID   doxycycline  100 mg Oral BID   folic acid  2 mg Oral Daily   gatifloxacin  1 drop Both Eyes Daily   insulin aspart  0-9 Units Subcutaneous TID WC   predniSONE  40 mg Oral Q breakfast   sodium chloride flush  3 mL Intravenous Q12H   timolol  1 drop Both Eyes BID   Continuous Infusions:  PRN Meds: acetaminophen **OR** acetaminophen, antiseptic oral rinse, artificial tears   Vital Signs    Vitals:   10/24/20 1945 10/24/20 2315 10/25/20 0313 10/25/20 0806  BP: (!) 113/56 101/77 120/73 113/63  Pulse: 93 84 (!) 101 (!) 103  Resp: 17 16 17 16   Temp: 98.2 F (36.8 C) 98.6 F (37 C) (!) 97.5 F (36.4 C) 98.2 F (36.8 C)  TempSrc: Oral Oral Oral Oral  SpO2:  99% 100% 100%  Weight:      Height:        Intake/Output Summary (Last 24 hours) at 10/25/2020 1033 Last data filed at 10/24/2020 2315 Gross per 24 hour  Intake 1801.15 ml  Output --  Net 1801.15 ml   Last 3 Weights 10/24/2020 09/19/2020 06/20/2020  Weight (lbs) 125 lb 125 lb 133 lb 9.6 oz  Weight (kg) 56.7 kg 56.7 kg 60.601 kg     Telemetry    N/a - Personally Reviewed  ECG    Reviewed yesterday's - NSR nonspecific STTW changes - Personally Reviewed See addendum below for f/u EKG  Physical Exam   GEN: No acute distress.  HEENT: Normocephalic, atraumatic, sclera non-icteric. Neck: No JVD or bruits. Cardiac: Reg rhythm borderline elevated rate no murmurs, rubs, or gallops.  Respiratory: Clear to  auscultation bilaterally. Breathing is unlabored. GI: Soft, nontender, non-distended, BS +x 4. MS: no deformity. Extremities: No clubbing or cyanosis. No edema. Distal pedal pulses are 2+ and equal bilaterally. Right heel ulcer wrapped. Neuro:  AAOx3. Follows commands. Psych:  Responds to questions appropriately with a normal affect.  Labs    High Sensitivity Troponin:   Recent Labs  Lab 10/23/20 2359 10/24/20 0447 10/24/20 0814 10/24/20 1024  TROPONINIHS 3,248* 3,405* 2,874* 1,583*      Cardiac EnzymesNo results for input(s): TROPONINI in the last 168 hours. No results for input(s): TROPIPOC in the last 168 hours.   Chemistry Recent Labs  Lab 10/23/20 1214 10/23/20 2359 10/24/20 0447  NA 127* 131* 130*  K 5.4* 4.3 3.7  CL 98 102 102  CO2 18* 20* 22  GLUCOSE 209* 203* 131*  BUN 19 17 13   CREATININE 1.07* 0.99 0.92  CALCIUM 9.0 8.3* 7.7*  PROT 6.2*  --   --   ALBUMIN 2.6*  --   --   AST 61*  --   --   ALT 41  --   --   ALKPHOS 103  --   --   BILITOT 1.2  --   --  GFRNONAA 52* 57* >60  ANIONGAP 11 9 6      Hematology Recent Labs  Lab 10/23/20 2359 10/24/20 0447 10/25/20 0832  WBC 9.6 8.2 9.5  RBC 3.19* 2.85* 3.20*  HGB 10.6* 9.4* 10.7*  HCT 32.1* 27.9* 31.1*  MCV 100.6* 97.9 97.2  MCH 33.2 33.0 33.4  MCHC 33.0 33.7 34.4  RDW 13.6 13.6 14.2  PLT 206 226 255    BNPNo results for input(s): BNP, PROBNP in the last 168 hours.   DDimer No results for input(s): DDIMER in the last 168 hours.   Radiology    CT Head Wo Contrast  Result Date: 10/23/2020 CLINICAL DATA:  Mental status change, unknown cause EXAM: CT HEAD WITHOUT CONTRAST TECHNIQUE: Contiguous axial images were obtained from the base of the skull through the vertex without intravenous contrast. COMPARISON:  None. FINDINGS: Brain: No evidence of acute infarction, hemorrhage, hydrocephalus, extra-axial collection or mass lesion/mass effect. Moderate patchy white matter hypoattenuation, nonspecific  but compatible with chronic microvascular ischemic disease. Mild for age atrophy with ex vacuo ventricular dilation. Vascular: No hyperdense vessel identified. Calcific intracranial atherosclerosis. Skull: No acute fracture. Sinuses/Orbits: Visualized sinuses are clear. Other: No mastoid effusions. IMPRESSION: No evidence of acute intracranial abnormality. Electronically Signed   By: Margaretha Sheffield M.D.   On: 10/23/2020 13:40   MR BRAIN WO CONTRAST  Result Date: 10/23/2020 CLINICAL DATA:  Weakness EXAM: MRI HEAD WITHOUT CONTRAST TECHNIQUE: Multiplanar, multiecho pulse sequences of the brain and surrounding structures were obtained without intravenous contrast. COMPARISON:  None. FINDINGS: Brain: No acute infarct, mass effect or extra-axial collection. No acute or chronic hemorrhage. There is multifocal hyperintense T2-weighted signal within the white matter. Generalized volume loss without a clear lobar predilection. The midline structures are normal. Vascular: Major flow voids are preserved. Skull and upper cervical spine: Normal calvarium and skull base. Visualized upper cervical spine and soft tissues are normal. Sinuses/Orbits:No paranasal sinus fluid levels or advanced mucosal thickening. No mastoid or middle ear effusion. Normal orbits. IMPRESSION: 1. No acute intracranial abnormality. 2. Generalized volume loss and findings of chronic small vessel disease. Electronically Signed   By: Ulyses Jarred M.D.   On: 10/23/2020 23:04   VAS [place] ABI WITH/WO TBI  Result Date: 10/24/2020  LOWER EXTREMITY DOPPLER STUDY Patient Name:  Terry Alexander  Date of Exam:   10/24/2020 Medical Rec #: [id number]    Accession #:    [id number] Date of Birth: February 16, 1940    Patient Gender: F Patient Age:   14 years Exam Location:  [facility] Procedure:      VAS [place] ABI WITH/WO TBI Referring Phys: ERIK HOFFMAN --------------------------------------------------------------------------------  Indications: Ulceration, and Weak  pulses on palpation.  Limitations: Today's exam was limited due to skin changes resulting in poor              ultrasound/tissue interface. Comparison Study: No prior studies. Performing Technologist: Darlin Coco RDMS RVT  Examination Guidelines: A complete evaluation includes at minimum, Doppler waveform signals and systolic blood pressure reading at the level of bilateral brachial, anterior tibial, and posterior tibial arteries, when vessel segments are accessible. Bilateral testing is considered an integral part of a complete examination. Photoelectric Plethysmograph (PPG) waveforms and toe systolic pressure readings are included as required and additional duplex testing as needed. Limited examinations for reoccurring indications may be performed as noted.  ABI Findings: +--------+------------------+-----+---------+--------+ Right   Rt Pressure (mmHg)IndexWaveform Comment  +--------+------------------+-----+---------+--------+ [id number]  triphasic         +--------+------------------+-----+---------+--------+ PTA     116               1.06 biphasic          +--------+------------------+-----+---------+--------+ DP      124               1.14 triphasic         +--------+------------------+-----+---------+--------+ +--------+------------------+-----+----------------+---------------------------+ Left    Lt Pressure (mmHg)IndexWaveform        Comment                     +--------+------------------+-----+----------------+---------------------------+ [id number]                    triphasic                                   +--------+------------------+-----+----------------+---------------------------+ PTA     113               1.04 audibly biphasicSuboptimal signal due to                                                   skin changes.               +--------+------------------+-----+----------------+---------------------------+ DP      111                1.02 triphasic                                   +--------+------------------+-----+----------------+---------------------------+ +-------+-----------+-----------+------------+------------+ ABI/TBIToday's ABIToday's TBIPrevious ABIPrevious TBI +-------+-----------+-----------+------------+------------+ Right  1.14                                           +-------+-----------+-----------+------------+------------+ Left   1.04                                           +-------+-----------+-----------+------------+------------+  Summary: Right: Resting right ankle-brachial index is within normal range. No evidence of significant right lower extremity arterial disease. Left: Resting left ankle-brachial index is within normal range. No evidence of significant left lower extremity arterial disease.  *See table(s) above for measurements and observations.  Electronically signed by Harold Barban MD on 10/24/2020 at 9:20:15 PM.    Final    ECHOCARDIOGRAM COMPLETE  Result Date: 10/24/2020    ECHOCARDIOGRAM REPORT   Patient Name:   TYMEKA PRIVETTE Date of Exam: 10/24/2020 Medical Rec #:  [id number]   Height:       63.0 in Accession #:    [id number]  Weight:       125.0 lb Date of Birth:  1940/12/04   BSA:          1.584 m Patient Age:    87 years    BP:           104/57 mmHg Patient Gender: F           HR:  84 bpm. Exam Location:  Inpatient Procedure: 2D Echo, Cardiac Doppler and Color Doppler Indications:    Abnormal EKG  History:        Patient has no prior history of Echocardiogram examinations.                 Risk Factors:Diabetes and Dyslipidemia.  Sonographer:    Wenda Low Referring Phys: [place]  1. Septal and apical akinesis preserved mid and basal function Prominent trabeculations in apex consider f/u contrast study to r/o thormbus. Left ventricular ejection fraction, by estimation, is 45 to 50%. The left ventricle has mildly decreased function.  The left ventricle has no regional wall motion abnormalities. The left ventricular internal cavity size was mildly dilated. Left ventricular diastolic parameters are consistent with Grade I diastolic dysfunction (impaired relaxation).  2. Right ventricular systolic function is normal. The right ventricular size is normal. There is normal pulmonary artery systolic pressure.  3. Left atrial size was moderately dilated.  4. The mitral valve is normal in structure. Mild mitral valve regurgitation. No evidence of mitral stenosis.  5. The aortic valve was not well visualized. Aortic valve regurgitation is not visualized. Mild to moderate aortic valve sclerosis/calcification is present, without any evidence of aortic stenosis.  6. The inferior vena cava is normal in size with greater than 50% respiratory variability, suggesting right atrial pressure of 3 mmHg. FINDINGS  Left Ventricle: Septal and apical akinesis preserved mid and basal function Prominent trabeculations in apex consider f/u contrast study to r/o thormbus. Left ventricular ejection fraction, by estimation, is 45 to 50%. The left ventricle has mildly decreased function. The left ventricle has no regional wall motion abnormalities. The left ventricular internal cavity size was mildly dilated. There is no left ventricular hypertrophy. Left ventricular diastolic parameters are consistent with Grade I diastolic dysfunction (impaired relaxation). Right Ventricle: The right ventricular size is normal. No increase in right ventricular wall thickness. Right ventricular systolic function is normal. There is normal pulmonary artery systolic pressure. The tricuspid regurgitant velocity is 2.10 m/s, and  with an assumed right atrial pressure of 3 mmHg, the estimated right ventricular systolic pressure is 35.3 mmHg. Left Atrium: Left atrial size was moderately dilated. Right Atrium: Right atrial size was normal in size. Pericardium: There is no evidence of pericardial  effusion. Mitral Valve: The mitral valve is normal in structure. Mild mitral valve regurgitation. No evidence of mitral valve stenosis. MV peak gradient, 2.3 mmHg. The mean mitral valve gradient is 1.0 mmHg. Tricuspid Valve: The tricuspid valve is normal in structure. Tricuspid valve regurgitation is trivial. No evidence of tricuspid stenosis. Aortic Valve: The aortic valve was not well visualized. Aortic valve regurgitation is not visualized. Mild to moderate aortic valve sclerosis/calcification is present, without any evidence of aortic stenosis. Aortic valve mean gradient measures 3.0 mmHg.  Aortic valve peak gradient measures 5.6 mmHg. Aortic valve area, by VTI measures 2.25 cm. Pulmonic Valve: The pulmonic valve was normal in structure. Pulmonic valve regurgitation is not visualized. No evidence of pulmonic stenosis. Aorta: The aortic root is normal in size and structure. Venous: The inferior vena cava is normal in size with greater than 50% respiratory variability, suggesting right atrial pressure of 3 mmHg. IAS/Shunts: No atrial level shunt detected by color flow Doppler.  LEFT VENTRICLE PLAX 2D LVIDd:         3.20 cm     Diastology LVIDs:         2.40 cm  LV e' medial:    3.70 cm/s LV PW:         1.00 cm     LV E/e' medial:  9.9 LV IVS:        1.10 cm     LV e' lateral:   6.31 cm/s LVOT diam:     1.90 cm     LV E/e' lateral: 5.8 LV SV:         50 LV SV Index:   32 LVOT Area:     2.84 cm  LV Volumes (MOD) LV vol d, MOD A2C: 32.7 ml LV vol d, MOD A4C: 59.8 ml LV vol s, MOD A2C: 24.8 ml LV vol s, MOD A4C: 22.5 ml LV SV MOD A2C:     7.9 ml LV SV MOD A4C:     59.8 ml LV SV MOD BP:      20.8 ml RIGHT VENTRICLE RV Basal diam:  2.50 cm RV Mid diam:    2.10 cm LEFT ATRIUM             Index        RIGHT ATRIUM           Index LA diam:        4.30 cm 2.72 cm/m   RA Area:     11.70 cm LA Vol (A2C):   36.2 ml 22.86 ml/m  RA Volume:   20.70 ml  13.07 ml/m LA Vol (A4C):   42.1 ml 26.59 ml/m LA Biplane Vol: 41.0  ml 25.89 ml/m  AORTIC VALVE                    PULMONIC VALVE AV Area (Vmax):    2.07 cm     PV Vmax:       1.01 m/s AV Area (Vmean):   2.23 cm     PV Peak grad:  4.1 mmHg AV Area (VTI):     2.25 cm AV Vmax:           118.00 cm/s AV Vmean:          72.500 cm/s AV VTI:            0.224 m AV Peak Grad:      5.6 mmHg AV Mean Grad:      3.0 mmHg LVOT Vmax:         86.20 cm/s LVOT Vmean:        57.000 cm/s LVOT VTI:          0.178 m LVOT/AV VTI ratio: 0.79  AORTA Ao Root diam: 2.90 cm Ao Asc diam:  2.50 cm MITRAL VALVE               TRICUSPID VALVE MV Area (PHT): 4.99 cm    TR Peak grad:   17.6 mmHg MV Area VTI:   4.11 cm    TR Vmax:        210.00 cm/s MV Peak grad:  2.3 mmHg MV Mean grad:  1.0 mmHg    SHUNTS MV Vmax:       0.76 m/s    Systemic VTI:  0.18 m MV Vmean:      38.8 cm/s   Systemic Diam: 1.90 cm MV Decel Time: 152 msec MV E velocity: 36.80 cm/s MV A velocity: 71.10 cm/s MV E/A ratio:  0.52 Jenkins Rouge MD Electronically signed by Jenkins Rouge MD Signature Date/Time: 10/24/2020/4:24:10 PM    Final     Cardiac Studies  2d echo 10/24/20  1. Septal and apical akinesis preserved mid and basal function Prominent  trabeculations in apex consider f/u contrast study to r/o thormbus. Left  ventricular ejection fraction, by estimation, is 45 to 50%. The left  ventricle has mildly decreased  function. The left ventricle has no regional wall motion abnormalities.  The left ventricular internal cavity size was mildly dilated. Left  ventricular diastolic parameters are consistent with Grade I diastolic  dysfunction (impaired relaxation).   2. Right ventricular systolic function is normal. The right ventricular  size is normal. There is normal pulmonary artery systolic pressure.   3. Left atrial size was moderately dilated.   4. The mitral valve is normal in structure. Mild mitral valve  regurgitation. No evidence of mitral stenosis.   5. The aortic valve was not well visualized. Aortic valve  regurgitation  is not visualized. Mild to moderate aortic valve sclerosis/calcification  is present, without any evidence of aortic stenosis.   6. The inferior vena cava is normal in size with greater than 50%  respiratory variability, suggesting right atrial pressure of 3 mmHg.   Patient Profile     80 y.o. female with breast CA, DM, HLD, Sjogren's syndrome who was admitted with weakness and fall where she could not get up until home health aide arrived. Typically uses walker. Initial workup has shown hyponatremia, hyperkalemia, hypoalbuminemia, abnormal LFT (AST), mild renal insufficiency, hypomagnesemia, lactic acidosis, and elevated troponin values to a peak of 3405 (negative CK). 2D Echo showed EF 45-50%, grade 1 DD, mild MR, prominent trabeculations in apex.  Assessment & Plan    1. Weakness/fall - multiple metabolic disturbances noted above - hypotensive on admission - will defer electrolyte management per primary team  2. Elevated troponin of unclear significance - peak 3,405 - typically higher than would be expected for demand ischemia - however, clinical picture is not entirely c/w ACS and enzymes are not in a pattern that suggests this was a precipitating event for the hospitalization - coronary CTA is to be considered in the future, but for now medical therapy recommended - she remains asymptomatic without any CP or SOB - could this represent a stress-induced CM type picture? 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 Newly recognized  cardiomyopathy, mild MR - EF 45-50%, no prior to compare to, etiology unclear at this time - ischemic eval deferred pending improvement in overall clinical status - await clarification from Dr. Percival Spanish - 2D echo study raised question of repeating limited study with contrast to exclude thrombus - soft BP prohibits addition of GMDT at this time  4. Ulcer of right heel - ABIs normal, ? relationship to recent fall - further mgmt per primaryteam  5. Dyslipidemia - LDL controlled at 32, continue low dose PTA atorvastatin   For questions or updates, please contact [place] Please consult [email or web address] for contact info under Cardiology/STEMI.  Signed, Charlie Pitter, PA-C 10/25/2020, 10:33 AM    History and all data above reviewed.  Patient examined.  I agree with the findings  as above.    She is very agitated and seems confused.  Denies pain or SOB. She just reports that her legs are weak.  The patient exam reveals COR:RRR  ,  Lungs: Clear  ,  Abd: Positive bowel sounds, no rebound no guarding, Ext Unable to exam, the patient was not cooperative with the exam.   .  All available labs, radiology testing, previous records reviewed. Agree with documented assessment and plan. NWQMI:  EKG as above today with mild ST elevation.   MI in the past although the exact time frame of this is not clear.  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  However, given frailty and now confusion agitation she is clear not a candidate for invasive or even non invasive evaluation.  Thankfully she is not having pain or SOB by her report.  I did talk with nursing and the primary service has seen her this afternoon to address what appears to be a rapid change in her neurologically.    Jeneen Rinks Mcclellan Demarais  2:41 PM  10/25/2020

## 2020-10-25 NOTE — Progress Notes (Addendum)
PT called and while working with patient and getting her back to bed, pt hit her head on the bedrail. Pt assessed and no changes were noted, vitals stable. MD notified and at bedside, orders for head CT received.

## 2020-10-25 NOTE — Progress Notes (Addendum)
Received page from RN that patient may have fallen and hit her head. Patient evaluated at bedside.  Staff states that she did not fall but rather hit her head on the guardrail during her PT session. The patient states "it did not hurt me."  No focal deficits noted on exam, however patient is agitated at bedside. Patient states that she would "like to go to her apartment for just a few minutes and then come back."  Per RN, patient has been agitated since this morning (before hitting her head on guardrail).  This is also a noticeable change compared to my examination of her from yesterday.  Prior EKG showed QTC 470 ms. Will give lowest dose of Seroquel at nighttime for agitation and obtain EKG in the morning to monitor her QTC. Ordered head CT without contrast.

## 2020-10-25 NOTE — Progress Notes (Signed)
Subjective: Patient was seen at bedside rounds this morning.  Patient is tearful this morning, stating that she has been having difficulty using the phone in the room to call her brother.  No other complaints or concerns.  Objective:  Vital signs in last 24 hours: Vitals:   10/24/20 1945 10/24/20 2315 10/25/20 0313 10/25/20 0806  BP: (!) 113/56 101/77 120/73 113/63  Pulse: 93 84 (!) 101 (!) 103  Resp: 17 16 17 16   Temp: 98.2 F (36.8 C) 98.6 F (37 C) (!) 97.5 F (36.4 C) 98.2 F (36.8 C)  TempSrc: Oral Oral Oral Oral  SpO2:  99% 100% 100%  Weight:      Height:        General: Ill-appearing, tearful and anxious HE: Normocephalic, atraumatic. Keratoconjunctivitis.  ENT: No congestion, no rhinorrhea, no exudate or erythema. Mucous membranes are dry. Cardiovascular: Tachycardic, regular rhythm. No murmurs, rubs, or gallops.  Pulmonary: Effort normal, breath sounds normal. No wheezes, rales, or rhonchi Abdominal: soft, nontender, non-distended Musculoskeletal: no swelling, deformity, injury or tenderness in extremities Skin: Warm, dry. There is a 3 x 4 cm ulcer on the medial aspect of the heel, no purulence or drainage noted.  No active bleeding.   Neurologic exam: Motor: Bulk muscle and tone are normal. Strength 3/5 in LLE. Sensory: Light touch intact and symmetric bilaterally  Psychiatric: Tearful and anxious   Assessment/Plan:  Principal Problem:   NSTEMI (non-ST elevated myocardial infarction) ([place]) Active Problems:   Sjogren syndrome with keratoconjunctivitis ([facility])   Generalized weakness   Pressure injury of right heel, stage 2 ([place])  #NSTEMI #Cardiomyopathy Patient continues to deny chest pain or shortness of breath. However, EKG on arrival showed nonspecific changes T wave inversions in V1 and V2 and possible Q waves in lead I and II. No prior EKG for comparison. Troponins 3.2K-->3.4K--> 2.8K-->1.5K.  A1c of 6.8.  Echocardiogram showed EF 45-50% G1DD, mild MR,  trabeculations in the apex; no priors for comparison. Cardiology was consulted, at this time we will continue with medical management and will defer ischemic eval at this time pending improvement in her overall clinical status. - Cardiology consulted, appreciate their recommendations - Continue aspirin 81 mg daily - Continue atorvastatin 10 mg p.o. daily - TSH pending   #Generalized weakness On arrival, patient endorsed more weakness in her right leg and normal though she has trouble lifting her legs at baseline. However, patient endorses noticeable recent change in weakness since Tuesday. Work-up including CT head and MRI brain have been unremarkable for acute changes. Patient endorses 55 pound weight loss in the past year, and she states that she has a home health nurse come twice a week. This was all confirmed by staff at [facility].  - Nutrition consulted, appreciate their recs - UA pending, urine sodium and osm pending - PT/OT evals, appreciate their recs   #Heel ulcer, right Dry ulcer noted on the medial surface of her right heel. No purulence. Patient continues to be afebrile without elevation in WBC. We will hold off on x-ray and antibiotics given low concern for osteomyelitis at this time. ABIs were obtained and unremarkable.  #Diabetes mellitus Most recent A1c of 6.8. Per [facility], patient is not on insulin at home. Most CBG of 100. -SSI, sensitive   #History of hypertension Patient is on hydrochlorothiazide 25 mg daily and losartan 50 mg daily at home.   -Holding home HCTZ and losartan in the setting of recent hypotension.   #Hyperlipidemia Patient is on atorvastatin 10  mg daily. Most recent lipid panel shows LDL 32, HDL 53. -Continue atorvastatin 10 mg p.o. daily  #H/o left corneal ulcer, right corneal scar #Glaucoma #Sjogren's syndrome Patient was followed by [facility] ophthalmology in the past. Has been on doxycycline and prednisone regimens for her corneal  melt. -Continue doxycycline 100 mg p.o. twice daily -Continue prednisone 40 mg p.o. daily -Zymaxid drops daily -Cyclosporine drops twice daily -Dorzolamide drops twice daily -Timolol drops twice daily -Lacrilube ointment as needed  Prior to Admission Living Arrangement: Home Barriers to Discharge: Medical management of NSTEMI Dispo: Anticipated discharge pending evaluation and work-up of NSTEMI  Orvis Brill, MD 10/25/2020, 11:17 AM Pager: [phone]  After 5pm on weekdays and 1pm on weekends: On Call pager [phone]

## 2020-10-25 NOTE — Progress Notes (Signed)
Initial Nutrition Assessment  DOCUMENTATION CODES:   Severe malnutrition in context of chronic illness  INTERVENTION:  -Recommend liberalizing diet to regular  -Ensure Enlive po BID, each supplement provides 350 kcal and 20 grams of protein -MVI with minerals daily  NUTRITION DIAGNOSIS:   Severe Malnutrition related to chronic illness as evidenced by severe muscle depletion, severe fat depletion.  GOAL:   Patient will meet greater than or equal to 90% of their needs  MONITOR:   PO intake, Supplement acceptance, Weight trends, Labs, I & O's  REASON FOR ASSESSMENT:   Consult Assessment of nutrition requirement/status, Poor PO  ASSESSMENT:   Pt with a PMH significant for HTN, HLD, DM, Sjogrens, and breast CA s/p mastectomy admitted with generalized weakness and found to have NSTEMI.  Pt was found unable to get up off the toilet. She reports that she sat down to go to the bathroom and was unable to get back up. When asked why she was unable to get up off the toilet she responds with "I do not know." Pt reports difficulty lifting her legs at baseline but endorses increased weakness in her R leg. Pt uses a walker at baseline. Estimates she was on the floor for several hours.     She has very poor vision at baseline and has had difficulty taking care of herself recently, relying on family and neighbors. She reports that approximately 1 month ago, she began to decline and has had more trouble taking care of herself. She is eating less and endorses a 55 pound weight loss over the past year. Unable to verify weight loss based on available weight readings. Pt agreeable trying Ensure while admitted.    No PO intake documented.  Medications: vitamin c, abx, folic acid, SSI w/ meals, deltasone Labs: Na 133 (L) CBGs: 91-183 x 24 hours  No UOP documented x24 hours I/O: +2.9L since admit  NUTRITION - FOCUSED PHYSICAL EXAM:  Flowsheet Row Most Recent Value  Orbital Region No depletion   Upper Arm Region Severe depletion  Thoracic and Lumbar Region Severe depletion  Buccal Region Moderate depletion  Temple Region Mild depletion  Clavicle Bone Region Severe depletion  Clavicle and Acromion Bone Region Severe depletion  Scapular Bone Region Severe depletion  Dorsal Hand Severe depletion  Patellar Region Severe depletion  Anterior Thigh Region Severe depletion  Posterior Calf Region Severe depletion  Edema (RD Assessment) None  Hair Unable to assess  Eyes Reviewed  Mouth Reviewed  Skin Other (Comment)  [dry and cracking]  Nails Other (Comment)  [hardened]       Diet Order:   Diet Order             Diet heart healthy/carb modified Room service appropriate? Yes; Fluid consistency: Thin  Diet effective now                   EDUCATION NEEDS:   Not appropriate for education at this time  Skin:  Skin Assessment: Skin Integrity Issues: Skin Integrity Issues:: Unstageable Unstageable: R heel  Last BM:  10/12  Height:   Ht Readings from Last 1 Encounters:  10/24/20 5\' 3"  (1.6 m)    Weight:   Wt Readings from Last 10 Encounters:  10/26/20 56.4 kg  09/19/20 56.7 kg  06/20/20 60.6 kg  03/21/20 62.9 kg  02/13/20 63.7 kg  12/21/19 66.6 kg  04/24/14 82.6 kg  04/04/14 82.1 kg   BMI:  Body mass index is 22.03 kg/m.  Estimated Nutritional Needs:  Kcal:  1500-1700  Protein:  75-85 grams  Fluid:  >1.5L/d    Larkin Ina, MS, RD, LDN (she/her/hers) RD pager number and weekend/on-call pager number located in [place].

## 2020-10-25 NOTE — NC FL2 (Signed)
MEDICAID FL2 LEVEL OF CARE SCREENING TOOL     IDENTIFICATION  Patient Name: Terry Alexander Birthdate: 08/15/1940 Sex: female Admission Date (Current Location): 10/23/2020  [facility] and [place] Number:  Herbalist and Address:  [facility] [place] [place] [address], [place], [place] [address]      Provider Number: [id number]  Attending Physician Name and Address:  Lucious Groves, DO  Relative Name and Phone Number:       Current Level of Care: Hospital Recommended Level of Care: [place] Prior Approval Number:    Date Approved/Denied:   PASRR Number:    Discharge Plan: SNF    Current Diagnoses: Patient Active Problem List   Diagnosis Date Noted   NSTEMI (non-ST elevated myocardial infarction) ([place]) 10/24/2020   Pressure injury of right heel, stage 2 ([place]) 10/24/2020   Generalized weakness 10/23/2020   High risk medication use 02/13/2020   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/2019   Lower extremity edema 12/21/2019   Sjogren syndrome with keratoconjunctivitis ([place]) 12/21/2019   Diabetes mellitus ([place]) 07/11/2019   Hypercholesterolemia 07/11/2019   Hypertensive disorder 07/11/2019   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   Ptosis 04/04/2014   Vision changes 04/04/2014    Orientation RESPIRATION BLADDER Height & Weight     Self, Place  Normal Continent Weight: 125 lb (56.7 kg) Height:  5\' 3"  (160 cm)  BEHAVIORAL SYMPTOMS/MOOD NEUROLOGICAL BOWEL NUTRITION STATUS      Continent Diet (heart healthy/carb modified)  AMBULATORY STATUS COMMUNICATION OF NEEDS Skin   Extensive Assist Verbally PU Stage and Appropriate Care PU Stage 1 Dressing:  (right heel, foam dressing, lift every shift to assess and change PRN)                     Personal Care Assistance Level of Assistance  Bathing, Feeding, Dressing Bathing Assistance: Maximum assistance Feeding assistance: Limited assistance Dressing Assistance: Maximum assistance     Functional  Limitations Info  Sight Sight Info: Impaired        SPECIAL CARE FACTORS FREQUENCY  PT (By licensed PT), OT (By licensed OT)     PT Frequency: 5x/wk OT Frequency: 5x/wk            Contractures Contractures Info: Not present    Additional Factors Info  Code Status, Allergies, Psychotropic, Insulin Sliding Scale Code Status Info: DNR Allergies Info: Methotrexate Derivatives, Codeine Psychotropic Info: Seroquel 25mg  daily at bed Insulin Sliding Scale Info: see DC summary       Current Medications (10/25/2020):  This is the current hospital active medication list Current Facility-Administered Medications  Medication Dose Route Frequency Provider Last Rate Last Admin   acetaminophen (TYLENOL) tablet 650 mg  650 mg Oral Q6H PRN Gaylan Gerold, DO   650 mg at 10/24/20 2225   Or   acetaminophen (TYLENOL) suppository 650 mg  650 mg Rectal Q6H PRN Gaylan Gerold, DO       antiseptic oral rinse (BIOTENE) solution 15 mL  15 mL Mouth Rinse PRN Orvis Brill, MD       artificial tears (LACRILUBE) ophthalmic ointment 1 application  1 application Both Eyes Daily PRN Gaylan Gerold, DO       aspirin EC tablet 81 mg  81 mg Oral Daily Gaylan Gerold, DO   81 mg at 10/25/20 1018   atorvastatin (LIPITOR) tablet 10 mg  10 mg Oral Daily Gaylan Gerold, DO   10 mg at 10/25/20 1019   cycloSPORINE (RESTASIS) 0.05 %  ophthalmic emulsion 1 drop  1 drop Both Eyes BID Gaylan Gerold, DO   1 drop at 10/25/20 1019   dorzolamide (TRUSOPT) 2 % ophthalmic solution 1 drop  1 drop Both Eyes BID Gaylan Gerold, DO   1 drop at 10/25/20 1019   doxycycline (VIBRA-TABS) tablet 100 mg  100 mg Oral BID Gaylan Gerold, DO   100 mg at 67/34/19 3790   folic acid (FOLVITE) tablet 2 mg  2 mg Oral Daily Christian, Rylee, MD   2 mg at 10/25/20 1019   gatifloxacin (ZYMAXID) 0.5 % ophthalmic drops 1 drop  1 drop Both Eyes Daily Gaylan Gerold, DO   1 drop at 10/24/20 1007   insulin aspart (novoLOG) injection 0-9 Units  0-9 Units  Subcutaneous TID WC Gaylan Gerold, DO       predniSONE (DELTASONE) tablet 40 mg  40 mg Oral Q breakfast Gaylan Gerold, DO   40 mg at 10/25/20 0831   QUEtiapine (SEROQUEL) tablet 25 mg  25 mg Oral QHS Orvis Brill, MD       sodium chloride flush (NS) 0.9 % injection 3 mL  3 mL Intravenous Q12H Gaylan Gerold, DO   3 mL at 10/25/20 1020   timolol (TIMOPTIC) 0.5 % ophthalmic solution 1 drop  1 drop Both Eyes BID Gaylan Gerold, DO   1 drop at 10/25/20 1019     Discharge Medications: Please see discharge summary for a list of discharge medications.  Relevant Imaging Results:  Relevant Lab Results:   Additional Information SS#: [id number]  Geralynn Ochs, LCSW

## 2020-10-25 NOTE — Progress Notes (Signed)
Patient increasingly agitated over shift and asking to call [facility] and Sister repeatedly when staff provides care. Patient was found to be on the phone with 911 by another RN, MD paged and on the way to bedside. Discussed with MD orders for agitation/ anxiety and orders for Seroquel received.

## 2020-10-26 ENCOUNTER — Inpatient Hospital Stay (HOSPITAL_COMMUNITY): Payer: Medicare (Managed Care)

## 2020-10-26 DIAGNOSIS — E43 Unspecified severe protein-calorie malnutrition: Secondary | ICD-10-CM | POA: Insufficient documentation

## 2020-10-26 DIAGNOSIS — I214 Non-ST elevation (NSTEMI) myocardial infarction: Principal | ICD-10-CM

## 2020-10-26 LAB — MAGNESIUM: Magnesium: 2 mg/dL (ref 1.7–2.4)

## 2020-10-26 LAB — CBC
HCT: 28.6 % — ABNORMAL LOW (ref 36.0–46.0)
Hemoglobin: 9.8 g/dL — ABNORMAL LOW (ref 12.0–15.0)
MCH: 33.6 pg (ref 26.0–34.0)
MCHC: 34.3 g/dL (ref 30.0–36.0)
MCV: 97.9 fL (ref 80.0–100.0)
Platelets: 229 10*3/uL (ref 150–400)
RBC: 2.92 MIL/uL — ABNORMAL LOW (ref 3.87–5.11)
RDW: 14.7 % (ref 11.5–15.5)
WBC: 11.8 10*3/uL — ABNORMAL HIGH (ref 4.0–10.5)
nRBC: 0 % (ref 0.0–0.2)

## 2020-10-26 LAB — BASIC METABOLIC PANEL
Anion gap: 14 (ref 5–15)
BUN: 16 mg/dL (ref 8–23)
CO2: 12 mmol/L — ABNORMAL LOW (ref 22–32)
Calcium: 8 mg/dL — ABNORMAL LOW (ref 8.9–10.3)
Chloride: 106 mmol/L (ref 98–111)
Creatinine, Ser: 1.3 mg/dL — ABNORMAL HIGH (ref 0.44–1.00)
GFR, Estimated: 41 mL/min — ABNORMAL LOW (ref >60)
Glucose, Bld: 208 mg/dL — ABNORMAL HIGH (ref 70–99)
Potassium: 5.1 mmol/L (ref 3.5–5.1)
Sodium: 132 mmol/L — ABNORMAL LOW (ref 135–145)

## 2020-10-26 LAB — GLUCOSE, CAPILLARY
Glucose-Capillary: 196 mg/dL — ABNORMAL HIGH (ref 70–99)
Glucose-Capillary: 146 mg/dL — ABNORMAL HIGH (ref 70–99)

## 2020-10-26 MED ORDER — ENSURE ENLIVE PO LIQD
237.0000 mL | Freq: Two times a day (BID) | ORAL | Status: DC
  Administered 2020-10-26: 237 mL via ORAL

## 2020-10-26 MED ORDER — ADULT MULTIVITAMIN W/MINERALS CH
1.0000 | ORAL_TABLET | Freq: Every day | ORAL | Status: DC
  Administered 2020-10-26: 1 via ORAL
  Filled 2020-10-26: qty 1

## 2020-10-26 MED ORDER — ENOXAPARIN SODIUM 30 MG/0.3ML IJ SOSY
30.0000 mg | PREFILLED_SYRINGE | INTRAMUSCULAR | Status: DC
  Administered 2020-10-26: 30 mg via SUBCUTANEOUS
  Filled 2020-10-26: qty 0.3

## 2020-10-26 NOTE — TOC Transition Note (Addendum)
Transition of Care [facility]) - CM/SW Discharge Note   Patient Details  Name: Terry Alexander MRN: [id number] Date of Birth: 1940-08-29  Transition of Care [facility]) CM/SW Contact:  Geralynn Ochs, LCSW Phone Number: 10/26/2020, 2:28 PM   Clinical Narrative:   CSW notified by MD that patient is medically stable for discharge today. CSW spoke with patient's sister, Dalene Seltzer, to discuss SNF offers. Billie chose [facility]. CSW confirmed bed availability at [facility] able to arrange transportation.   Nurse to call report to [phone]. [facility] to arrange transport at 3:00 PM.     Final next level of care: [place] Barriers to Discharge: Barriers Resolved   Patient Goals and CMS Choice Patient states their goals for this hospitalization and ongoing recovery are:: patient unable to participate in goal setting, not fully oriented CMS Medicare.gov Compare Post Acute Care list provided to:: Patient Represenative (must comment) Choice offered to / list presented to : Sibling  Discharge Placement              Patient chooses bed at: [facility] Patient to be transferred to facility by: [facility] Name of family member notified: [facility] Patient and family notified of of transfer: 10/26/20  Discharge Plan and Services     Post Acute Care Choice: [place]                               Social Determinants of Health (SDOH) Interventions     Readmission Risk Interventions No flowsheet data found.
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
## 2020-10-26 NOTE — Progress Notes (Signed)
I have sent a message to our office's scheduling team requesting a follow-up appointment, and our office will call the patient with this information. (Clinic templates are currently being adjusted so I am unable to schedule this myself currently.) Made schedulers aware she will be going to SNF.

## 2020-10-26 NOTE — Discharge Summary (Addendum)
Name: Terry Alexander MRN: [id number] DOB: 02-19-1940 80 y.o. PCP: [facility], [place]  Date of Admission: 10/23/2020 11:24 AM Date of Discharge: 10/26/2020 Attending Physician: Aldine Contes, MD  Discharge Diagnosis: 1.  NSTEMI/cardiomyopathy 2.  Malnutrition/deconditioning 3.  Right heel ulcer 4.  T2DM 5.  Hypertension 6.  HLD 7.  Corneal melt, Sjogren's syndrome 8.  Delirium, in-hospital  Discharge Medications: Allergies as of 10/26/2020       Reactions   Methotrexate Derivatives Other (See Comments)   Transaminitis, anemia, weight loss   Codeine Nausea And Vomiting        Medication List     TAKE these medications    acetaminophen 650 MG CR tablet Commonly known as: TYLENOL Take 1,300 mg by mouth in the morning and at bedtime.   aspirin 81 MG tablet Take 81 mg by mouth daily.   atorvastatin 10 MG tablet Commonly known as: LIPITOR Take 10 mg by mouth daily.   Cholecalciferol 10 MCG (400 UNIT) Caps Take 1,000 Units by mouth daily.   cycloSPORINE 0.05 % ophthalmic emulsion Commonly known as: RESTASIS Place 1 drop into both eyes 2 (two) times daily.   dorzolamide 2 % ophthalmic solution Commonly known as: TRUSOPT Place 1 drop into both eyes 2 (two) times daily.   doxycycline 100 MG tablet Commonly known as: VIBRA-TABS Take 100 mg by mouth 2 (two) times daily.   erythromycin ophthalmic ointment Place 1 application into the left eye 3 (three) times daily.   fexofenadine 180 MG tablet Commonly known as: ALLEGRA Take 180 mg by mouth daily as needed for allergies.   folic acid 1 MG tablet Commonly known as: FOLVITE Take 2 tablets (2 mg total) by mouth daily.   gabapentin 300 MG capsule Commonly known as: NEURONTIN Take 300 mg by mouth at bedtime.   GLUCERNA 1.5 CAL PO Take 8 oz by mouth in the morning and at bedtime.   Insulin Pen Needle 30G X 8 MM Misc Commonly known as: NOVOFINE Inject 1 packet into the skin as  needed.   lidocaine 5 % Commonly known as: LIDODERM Place 1 patch onto the skin daily. Apple once daily to leg at night for leg pain   losartan 100 MG tablet Commonly known as: COZAAR Take 100 mg by mouth daily.   lubriderm seriously sensitive Lotn Apply 1 application topically 4 (four) times daily as needed (dry skin).   Magnesium 400 MG Tabs Take 400 mg by mouth daily.   mirtazapine 15 MG tablet Commonly known as: REMERON Take 15 mg by mouth at bedtime.   moxifloxacin 0.5 % ophthalmic solution Commonly known as: VIGAMOX Place 1 drop into both eyes daily.   OCUSOFT EYE [place] OP Apply 1 application to eye 4 (four) times daily as needed (eye exudate).   pantoprazole 40 MG tablet Commonly known as: PROTONIX Protonix 40 mg tablet,delayed release  Take 1 tablet every day by oral route.   polyethylene glycol 17 g packet Commonly known as: MIRALAX / GLYCOLAX Take 17 g by mouth daily as needed for mild constipation.   predniSONE 20 MG tablet Commonly known as: DELTASONE Take 40 mg by mouth daily with breakfast. What changed: Another medication with the same name was removed. Continue taking this medication, and follow the directions you see here.   pyridostigmine 60 MG tablet Commonly known as: Mestinon Take 1 tablet (60 mg total) by mouth 3 (three) times daily.   Rectiv 0.4 % Oint Generic drug: Nitroglycerin  Place 1 application rectally in the morning and at bedtime.   senna 8.6 MG Tabs tablet Commonly known as: SENOKOT Take 2 tablets by mouth at bedtime as needed for mild constipation.   simethicone 80 MG chewable tablet Commonly known as: MYLICON Chew 80 mg by mouth 4 (four) times daily as needed for flatulence.   Systane Nighttime Oint Apply 1 application to eye 2 (two) times daily as needed (dry eye).   SYSTANE OP Apply 1 drop to eye daily as needed (dry eyes).   Systane 0.4-0.3 % Gel ophthalmic gel Generic drug: Polyethyl Glycol-Propyl Glycol Place 1  application into both eyes at bedtime.   timolol 0.5 % ophthalmic solution Commonly known as: BETIMOL Place 1 drop into both eyes 2 (two) times daily.   vitamin C 1000 MG tablet Take 1,000 mg by mouth daily.               Discharge Care Instructions  (From admission, onward)           Start     Ordered   10/26/20 0000  Discharge wound care:       Comments: Keep area clean and dry, apply abd pad over area   10/26/20 1404            Disposition and follow-up:   Terry Alexander was discharged from [facility] in Stable condition.  At the hospital follow up visit please address:  1.  NSTEMI/cardiomyopathy: Patient will continue aspirin 81 mg daily and atorvastatin 10 mg daily.  Hold off on ACE/ARB/ARN I/diuretic due to AKI.  Obtain repeat BMP to ensure creatinine improves.  Corneal melt: She will continue her current erythromycin, doxycycline, vitamin C, and prednisone regimens until follow-up with her Ophthalmologist on Monday 10/17.  2.  Labs / imaging needed at time of follow-up: Inpatient 2D echo study raised question of repeating limited study with contrast to exclude thrombus., obtain BMP to ensure creatinine improves, check for hyponatremia.  3.  Pending labs/ test needing follow-up: None  Follow-up Appointments:  Follow-up Information     Minus Breeding, MD Follow up.   Specialty: Cardiology Why: [place] location - the cardiology clinic will call you to arrange a follow-up visit. Contact information: [address] [place] [address] [phone]                 Hospital Course by problem list: 1.  NSTEMI/cardiomyopathy: Patient did not endorse any chest pain or shortness of breath on admit. However, EKG on arrival showed nonspecific changes T wave inversions in V1 and V2 and possible Q waves in lead I and II. No prior EKG for comparison. Troponins 3.2K-->3.4K--> 2.8K->1.5K.  A1c of 6.8.  Cardiology was consulted, who recommended aspirin therapy without heparin, therefore patient was given 325 mg aspirin followed by 81 mg daily. On hospital day 1, systolic blood pressures noted to be as low as the 80s.  She was given 1.5 L fluid boluses and subsequently placed on maintenance fluids.  Later that afternoon, systolic blood pressures improved to the 100s. Also on exam, her right great toe was cool to touch and pedal pulses are palpable but weak.  Initial concern for cardiogenic shock given hypotension and cool extremities on exam. We continued resuscitation with maintenance fluids until 10/12 when blood pressures normalized to [id number] systolic.  Blood pressure was otherwise monitored throughout her hospitalization.   Cardiology evaluated the patient on 10/12 and initially considered ischemia work-up with  CT angiography, however given her frailty medical management was recommended with aspirin 81 mg daily and atorvastatin 10 mg p.o. daily.  Echo obtained here showed EF 45-50% G1DD, mild MR, trabeculations in the apex; no priors for comparison.  On 10/14, repeat EKG showed no changes overall.  Cardiology again recommended medical management and given her frailty she is not a candidate for catheterization.  She has not been heparinized in the setting of fall and confusion.  Unable to add other blood pressure medications in the setting of soft BPs.  Of note, echo raise question of repeating limited study with contrast to exclude thrombus.  She will follow-up with cardiology in the outpatient setting and are considering addition of low dose beta-blocker if able to tolerate.  2.  Malnutrition/deconditioning:  On arrival, patient endorsed more weakness in her right leg and normal though she has trouble lifting her legs at baseline. However, patient endorses noticeable recent change in weakness since Tuesday. Work-up including CT head and MRI brain have been unremarkable for acute changes. Patient endorses  55 pound weight loss in the past year, and she states that she has a home health nurse come twice a week. This was all confirmed by staff at [facility].  Our nutrition team was consulted here as well as PT/OT.  PT/OT recommended SNF, which she will receive at [facility].  3.  Right heel ulcer: Dry ulcer noted on the medial surface of her right heel. No purulence. Patient was afebrile without elevation in WBC throughout her hospitalization.  We held off on x-ray and antibiotics given low concern for osteomyelitis.  Additionally, ABIs were obtained and found to be unremarkable.    4.  T2DM, diet-controlled: Most recent A1c of 6.8. Per [facility], patient is not on insulin at home.  Sliding scale was continued throughout her hospitalization, but blood sugars were well controlled during her stay here.  5.  Hypertension: Patient is on hydrochlorothiazide 25 mg daily and losartan 50 mg daily at home. We held these medications in the setting of her hypotension.   6. HLD: Patient is on atorvastatin 10 mg daily, most recent lipid panel shows LDL 32, HDL 53.  We continued her atorvastatin throughout her hospitalization.  7.  Corneal melt, Sjogren's syndrome: Patient was followed by [facility] ophthalmology in the past, is currently seen by ophthalmology through [place].  On 10/13 we were faxed medical records from primary care provider at [facility] and placed patient on doxycycline, prednisone, erythromycin, and vitamin C regimens for corneal melt.  Otherwise, her regular medicines were continued throughout as above. She will continue all of these regimens until she follows up with her Ophthalmologist.  8.  In-hospital delirium: On 10/13 in the AM, patient noted to be anxious and tearful attempting to call her brother from the room.  Later that day, received a page MRN that patient had hit her head on the side of the bed during her PT session.  When patient was evaluated at bedside, she stated that she wanted to "go to her  apartment for few minutes and come back" and was hitting remote on side of bed.  Review of records shows that she attempted to hit staff at times.  On 10/13, Seroquel was added at bedtime, and a.m. EKG showed QTC 438.  She was not discharged on this regimen.  CT head without contrast was also obtained given that we did not witness the patient hitting her head.  This did not show any acute intracranial abnormality.  On 10/14, she was placed on delirium precautions.  9. Urinary retention: On day of discharge, the patient had not urinated since midnight, and bladder scan showed 330 mL. In and out cath was performed prior to discharge. Will need continued monitoring of urine output after discharge.  Discharge Exam:   BP 98/70 (BP Location: Left Arm)   Pulse 92   Temp 98 F (36.7 C) (Oral)   Resp 19   Ht 5\' 3"  (1.6 m)   Wt 56.4 kg   SpO2 100%   BMI 22.03 kg/m  Discharge exam:  General: Ill-appearing, anxious at times but able to be consoled HE: Normocephalic, atraumatic. Keratoconjunctivitis.  ENT: No congestion, no rhinorrhea, no exudate or erythema. Mucous membranes are dry. Cardiovascular: Tachycardic, regular rhythm. No murmurs, rubs, or gallops.  Pulmonary: Effort normal, breath sounds normal. No wheezes, rales, or rhonchi Abdominal: soft, nontender, non-distended Musculoskeletal: no swelling, deformity, injury or tenderness in extremities Skin: Warm, dry.  Neurologic exam: AAOx3, no new focal deficits appreciated Psychiatric: Anxious but consolable  Pertinent Labs, Studies, and Procedures:  CBC Latest Ref Rng & Units 10/26/2020 10/25/2020 10/24/2020  WBC 4.0 - 10.5 K/uL 11.8(H) 9.5 8.2  Hemoglobin 12.0 - 15.0 g/dL 9.8(L) 10.7(L) 9.4(L)  Hematocrit 36.0 - 46.0 % 28.6(L) 31.1(L) 27.9(L)  Platelets 150 - 400 K/uL 229 255 226   BMP Latest Ref Rng & Units 10/26/2020 10/25/2020 10/24/2020  Glucose 70 - 99 mg/dL 208(H) 116(H) 131(H)  BUN 8 - 23 mg/dL 16 12 13   Creatinine 0.44 - 1.00  mg/dL 1.30(H) 0.92 0.92  BUN/Creat Ratio 6 - 22 (calc) - - -  Sodium 135 - 145 mmol/L 132(L) 133(L) 130(L)  Potassium 3.5 - 5.1 mmol/L 5.1 3.8 3.7  Chloride 98 - 111 mmol/L 106 106 102  CO2 22 - 32 mmol/L 12(L) 16(L) 22  Calcium 8.9 - 10.3 mg/dL 8.0(L) 7.8(L) 7.7(L)     Discharge Instructions: Discharge Instructions     Call MD for:  difficulty breathing, headache or visual disturbances   Complete by: As directed    Call MD for:  extreme fatigue   Complete by: As directed    Call MD for:  persistant dizziness or light-headedness   Complete by: As directed    Call MD for:  severe uncontrolled pain   Complete by: As directed    Diet - low sodium heart healthy   Complete by: As directed    Discharge wound care:   Complete by: As directed    Keep area clean and dry, apply abd pad over area   Increase activity slowly   Complete by: As directed        Signed: Orvis Brill, MD 10/26/2020, 2:04 PM   Pager: [phone]

## 2020-10-26 NOTE — Discharge Instructions (Signed)
You were hospitalized for something called an NSTEMI, which is a small heart attack.  You were seen by cardiology, aka the heart doctors while you are here, and they recommend you staying on medicines for this.  The heart doctors will arrange a follow-up appointment for you.  Otherwise, please call your primary care provider for any chest pain, shortness of breath, worsening weakness or numbness.

## 2020-10-26 NOTE — TOC Initial Note (Signed)
Transition of Care [facility]) - Initial/Assessment Note    Patient Details  Name: Terry Alexander MRN: [id number] Date of Birth: [birth year]  [facility] CM/SW Contact:    Geralynn Ochs, LCSW Phone Number: 10/26/2020, 2:27 PM  Clinical Narrative:        CSW received call from patient's social worker at [facility], [place], to discuss disposition. [facility] in agreement with SNF placement as patient is from home alone. CSW to complete referral and fax out to [facility] SNFs to see about bed availability. CSW to follow.        Expected Discharge Plan: Skilled Nursing Facility Barriers to Discharge: Continued Medical Work up, Ship broker   Patient Goals and CMS Choice Patient states their goals for this hospitalization and ongoing recovery are:: patient unable to participate in goal setting, not fully oriented CMS Medicare.gov Compare Post Acute Care list provided to:: Patient Represenative (must comment) Choice offered to / list presented to : Sibling  Expected Discharge Plan and Services Expected Discharge Plan: [place] Choice: [facility] Living arrangements for the past 2 months: Single Family Home Expected Discharge Date: 10/26/20                                    Prior Living Arrangements/Services Living arrangements for the past 2 months: Single Family Home Lives with:: Self Patient language and need for interpreter reviewed:: No Do you feel safe going back to the place where you live?: Yes      Need for Family Participation in Patient Care: Yes (Comment) Care giver support system in place?: No (comment)   Criminal Activity/Legal Involvement Pertinent to Current Situation/Hospitalization: No - Comment as needed  Activities of Daily Living      Permission Sought/Granted Permission sought to share information with : Facility Sport and exercise psychologist, Family Supports Permission granted to share information  with : Yes, Verbal Permission Granted  Share Information with NAME: Dalene Seltzer  Permission granted to share info w AGENCY: [facility], SNF  Permission granted to share info w Relationship: Sister     Emotional Assessment   Attitude/Demeanor/Rapport: Unable to Assess Affect (typically observed): Unable to Assess Orientation: : Oriented to Self, Oriented to Place Alcohol / Substance Use: Not Applicable Psych Involvement: No (comment)  Admission diagnosis:  Dehydration [E86.0] Hyponatremia [E87.1] Generalized weakness [R53.1] Patient Active Problem List   Diagnosis Date Noted   Protein-calorie malnutrition, severe 10/26/2020   NSTEMI (non-ST elevated myocardial infarction) ([place]) 10/24/2020   Pressure injury of right heel, stage 2 ([place]) 10/24/2020   Generalized weakness 10/23/2020   High risk medication use 02/13/2020   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/2019   Lower extremity edema 12/21/2019   Sjogren syndrome with keratoconjunctivitis ([place]) 12/21/2019   Diabetes mellitus ([place]) 07/11/2019   Hypercholesterolemia 07/11/2019   Hypertensive disorder 07/11/2019   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

## 2020-10-26 NOTE — Progress Notes (Addendum)
Progress Note  Patient Name: Terry Alexander Date of Encounter: 10/26/2020  Primary Cardiologist: Minus Breeding, MD  Subjective   Shouting to hold the phone in her hand this morning. She is oriented to place and month/year. Per nurse, EKG not done this AM as she was refusing care. She does not open her eyes when conversing.  Inpatient Medications    Scheduled Meds:  vitamin C  1,000 mg Oral Daily   aspirin EC  81 mg Oral Daily   atorvastatin  10 mg Oral Daily   cycloSPORINE  1 drop Both Eyes BID   dorzolamide  1 drop Both Eyes BID   doxycycline  100 mg Oral BID   erythromycin  1 application Both Eyes Y6A   feeding supplement  237 mL Oral BID BM   folic acid  2 mg Oral Daily   gatifloxacin  1 drop Both Eyes Daily   insulin aspart  0-9 Units Subcutaneous TID WC   multivitamin with minerals  1 tablet Oral Daily   predniSONE  40 mg Oral Q breakfast   QUEtiapine  25 mg Oral QHS   sodium chloride flush  3 mL Intravenous Q12H   timolol  1 drop Both Eyes BID   Continuous Infusions:  PRN Meds: acetaminophen **OR** acetaminophen, antiseptic oral rinse, artificial tears   Vital Signs    Vitals:   10/25/20 1650 10/25/20 2010 10/26/20 0008 10/26/20 0500  BP: (!) 81/60 109/63 137/84   Pulse: 96 (!) 111    Resp: 19 19    Temp: 98.6 F (37 C) 98.4 F (36.9 C)    TempSrc: Oral Oral    SpO2: 95% 98%    Weight:    56.4 kg  Height:       No intake or output data in the 24 hours ending 10/26/20 0858 Last 3 Weights 10/26/2020 10/24/2020 09/19/2020  Weight (lbs) 124 lb 5.4 oz 125 lb 125 lb  Weight (kg) 56.4 kg 56.7 kg 56.7 kg     Telemetry    NSR/ST - Personally Reviewed  ECG    Pt refused - Personally Reviewed  Physical Exam   GEN: No acute distress.  HEENT: Normocephalic, atraumatic, sclera non-icteric. Neck: No JVD or bruits. Cardiac: RRR no murmurs, rubs, or gallops.  Respiratory: Clear to auscultation bilaterally. Breathing is unlabored. GI: Soft, nontender,  non-distended, BS +x 4. MS: no deformity. Extremities: No clubbing or cyanosis. No edema. Distal pedal pulses are 2+ and equal bilaterally. Neuro:  A+O to self, month, year. Follows commands, but tangential, won't open her eyes Psych:  Agitated, tangential  Labs    High Sensitivity Troponin:   Recent Labs  Lab 10/23/20 2359 10/24/20 0447 10/24/20 0814 10/24/20 1024  TROPONINIHS 3,248* 3,405* 2,874* 1,583*      Cardiac EnzymesNo results for input(s): TROPONINI in the last 168 hours. No results for input(s): TROPIPOC in the last 168 hours.   Chemistry Recent Labs  Lab 10/23/20 1214 10/23/20 2359 10/24/20 0447 10/25/20 0832 10/26/20 0734  NA 127*   < > 130* 133* 132*  K 5.4*   < > 3.7 3.8 5.1  CL 98   < > 102 106 106  CO2 18*   < > 22 16* 12*  GLUCOSE 209*   < > 131* 116* 208*  BUN 19   < > 13 12 16   CREATININE 1.07*   < > 0.92 0.92 1.30*  CALCIUM 9.0   < > 7.7* 7.8* 8.0*  PROT 6.2*  --   --   --   --  ALBUMIN 2.6*  --   --   --   --   AST 61*  --   --   --   --   ALT 41  --   --   --   --   ALKPHOS 103  --   --   --   --   BILITOT 1.2  --   --   --   --   GFRNONAA 52*   < > >60 >60 41*  ANIONGAP 11   < > 6 11 14    < > = values in this interval not displayed.     Hematology Recent Labs  Lab 10/24/20 0447 10/25/20 0832 10/26/20 0734  WBC 8.2 9.5 11.8*  RBC 2.85* 3.20* 2.92*  HGB 9.4* 10.7* 9.8*  HCT 27.9* 31.1* 28.6*  MCV 97.9 97.2 97.9  MCH 33.0 33.4 33.6  MCHC 33.7 34.4 34.3  RDW 13.6 14.2 14.7  PLT 226 255 229    BNPNo results for input(s): BNP, PROBNP in the last 168 hours.   DDimer No results for input(s): DDIMER in the last 168 hours.   Radiology    VAS [place] ABI WITH/WO TBI  Result Date: 10/24/2020  LOWER EXTREMITY DOPPLER STUDY Patient Name:  Terry Alexander  Date of Exam:   10/24/2020 Medical Rec #: [id number]    Accession #:    [id number] Date of Birth: April 06, 1940    Patient Gender: F Patient Age:   80 years Exam Location:  [facility]  Procedure:      VAS [place] ABI WITH/WO TBI Referring Phys: ERIK HOFFMAN --------------------------------------------------------------------------------  Indications: Ulceration, and Weak pulses on palpation.  Limitations: Today's exam was limited due to skin changes resulting in poor              ultrasound/tissue interface. Comparison Study: No prior studies. Performing Technologist: Darlin Coco RDMS RVT  Examination Guidelines: A complete evaluation includes at minimum, Doppler waveform signals and systolic blood pressure reading at the level of bilateral brachial, anterior tibial, and posterior tibial arteries, when vessel segments are accessible. Bilateral testing is considered an integral part of a complete examination. Photoelectric Plethysmograph (PPG) waveforms and toe systolic pressure readings are included as required and additional duplex testing as needed. Limited examinations for reoccurring indications may be performed as noted.  ABI Findings: +--------+------------------+-----+---------+--------+ Right   Rt Pressure (mmHg)IndexWaveform Comment  +--------+------------------+-----+---------+--------+ [id number]                    triphasic         +--------+------------------+-----+---------+--------+ PTA     116               1.06 biphasic          +--------+------------------+-----+---------+--------+ DP      124               1.14 triphasic         +--------+------------------+-----+---------+--------+ +--------+------------------+-----+----------------+---------------------------+ Left    Lt Pressure (mmHg)IndexWaveform        Comment                     +--------+------------------+-----+----------------+---------------------------+ [id number]                    triphasic                                   +--------+------------------+-----+----------------+---------------------------+ PTA  113               1.04 audibly biphasicSuboptimal signal due to                                                    skin changes.               +--------+------------------+-----+----------------+---------------------------+ DP      111               1.02 triphasic                                   +--------+------------------+-----+----------------+---------------------------+ +-------+-----------+-----------+------------+------------+ ABI/TBIToday's ABIToday's TBIPrevious ABIPrevious TBI +-------+-----------+-----------+------------+------------+ Right  1.14                                           +-------+-----------+-----------+------------+------------+ Left   1.04                                           +-------+-----------+-----------+------------+------------+  Summary: Right: Resting right ankle-brachial index is within normal range. No evidence of significant right lower extremity arterial disease. Left: Resting left ankle-brachial index is within normal range. No evidence of significant left lower extremity arterial disease.  *See table(s) above for measurements and observations.  Electronically signed by Harold Barban MD on 10/24/2020 at 9:20:15 PM.    Final    ECHOCARDIOGRAM COMPLETE  Result Date: 10/24/2020    ECHOCARDIOGRAM REPORT   Patient Name:   Terry Alexander Date of Exam: 10/24/2020 Medical Rec #:  [id number]   Height:       63.0 in Accession #:    [id number]  Weight:       125.0 lb Date of Birth:  [birth year]   BSA:          1.584 m Patient Age:    36 years    BP:           104/57 mmHg Patient Gender: F           HR:           84 bpm. Exam Location:  Inpatient Procedure: 2D Echo, Cardiac Doppler and Color Doppler Indications:    Abnormal EKG  History:        Patient has no prior history of Echocardiogram examinations.                 Risk Factors:Diabetes and Dyslipidemia.  Sonographer:    Wenda Low Referring Phys: Jardine  1. Septal and apical akinesis preserved mid and basal function Prominent  trabeculations in apex consider f/u contrast study to r/o thormbus. Left ventricular ejection fraction, by estimation, is 45 to 50%. The left ventricle has mildly decreased function. The left ventricle has no regional wall motion abnormalities. The left ventricular internal cavity size was mildly dilated. Left ventricular diastolic parameters are consistent with Grade I diastolic dysfunction (impaired relaxation).  2. Right ventricular systolic function is normal. The right ventricular size is normal. There is normal pulmonary artery systolic pressure.  3. Left  atrial size was moderately dilated.  4. The mitral valve is normal in structure. Mild mitral valve regurgitation. No evidence of mitral stenosis.  5. The aortic valve was not well visualized. Aortic valve regurgitation is not visualized. Mild to moderate aortic valve sclerosis/calcification is present, without any evidence of aortic stenosis.  6. The inferior vena cava is normal in size with greater than 50% respiratory variability, suggesting right atrial pressure of 3 mmHg. FINDINGS  Left Ventricle: Septal and apical akinesis preserved mid and basal function Prominent trabeculations in apex consider f/u contrast study to r/o thormbus. Left ventricular ejection fraction, by estimation, is 45 to 50%. The left ventricle has mildly decreased function. The left ventricle has no regional wall motion abnormalities. The left ventricular internal cavity size was mildly dilated. There is no left ventricular hypertrophy. Left ventricular diastolic parameters are consistent with Grade I diastolic dysfunction (impaired relaxation). Right Ventricle: The right ventricular size is normal. No increase in right ventricular wall thickness. Right ventricular systolic function is normal. There is normal pulmonary artery systolic pressure. The tricuspid regurgitant velocity is 2.10 m/s, and  with an assumed right atrial pressure of 3 mmHg, the estimated right ventricular  systolic pressure is 79.4 mmHg. Left Atrium: Left atrial size was moderately dilated. Right Atrium: Right atrial size was normal in size. Pericardium: There is no evidence of pericardial effusion. Mitral Valve: The mitral valve is normal in structure. Mild mitral valve regurgitation. No evidence of mitral valve stenosis. MV peak gradient, 2.3 mmHg. The mean mitral valve gradient is 1.0 mmHg. Tricuspid Valve: The tricuspid valve is normal in structure. Tricuspid valve regurgitation is trivial. No evidence of tricuspid stenosis. Aortic Valve: The aortic valve was not well visualized. Aortic valve regurgitation is not visualized. Mild to moderate aortic valve sclerosis/calcification is present, without any evidence of aortic stenosis. Aortic valve mean gradient measures 3.0 mmHg.  Aortic valve peak gradient measures 5.6 mmHg. Aortic valve area, by VTI measures 2.25 cm. Pulmonic Valve: The pulmonic valve was normal in structure. Pulmonic valve regurgitation is not visualized. No evidence of pulmonic stenosis. Aorta: The aortic root is normal in size and structure. Venous: The inferior vena cava is normal in size with greater than 50% respiratory variability, suggesting right atrial pressure of 3 mmHg. IAS/Shunts: No atrial level shunt detected by color flow Doppler.  LEFT VENTRICLE PLAX 2D LVIDd:         3.20 cm     Diastology LVIDs:         2.40 cm     LV e' medial:    3.70 cm/s LV PW:         1.00 cm     LV E/e' medial:  9.9 LV IVS:        1.10 cm     LV e' lateral:   6.31 cm/s LVOT diam:     1.90 cm     LV E/e' lateral: 5.8 LV SV:         50 LV SV Index:   32 LVOT Area:     2.84 cm  LV Volumes (MOD) LV vol d, MOD A2C: 32.7 ml LV vol d, MOD A4C: 59.8 ml LV vol s, MOD A2C: 24.8 ml LV vol s, MOD A4C: 22.5 ml LV SV MOD A2C:     7.9 ml LV SV MOD A4C:     59.8 ml LV SV MOD BP:      20.8 ml RIGHT VENTRICLE RV Basal diam:  2.50 cm RV Mid  diam:    2.10 cm LEFT ATRIUM             Index        RIGHT ATRIUM           Index LA  diam:        4.30 cm 2.72 cm/m   RA Area:     11.70 cm LA Vol (A2C):   36.2 ml 22.86 ml/m  RA Volume:   20.70 ml  13.07 ml/m LA Vol (A4C):   42.1 ml 26.59 ml/m LA Biplane Vol: 41.0 ml 25.89 ml/m  AORTIC VALVE                    PULMONIC VALVE AV Area (Vmax):    2.07 cm     PV Vmax:       1.01 m/s AV Area (Vmean):   2.23 cm     PV Peak grad:  4.1 mmHg AV Area (VTI):     2.25 cm AV Vmax:           118.00 cm/s AV Vmean:          72.500 cm/s AV VTI:            0.224 m AV Peak Grad:      5.6 mmHg AV Mean Grad:      3.0 mmHg LVOT Vmax:         86.20 cm/s LVOT Vmean:        57.000 cm/s LVOT VTI:          0.178 m LVOT/AV VTI ratio: 0.79  AORTA Ao Root diam: 2.90 cm Ao Asc diam:  2.50 cm MITRAL VALVE               TRICUSPID VALVE MV Area (PHT): 4.99 cm    TR Peak grad:   17.6 mmHg MV Area VTI:   4.11 cm    TR Vmax:        210.00 cm/s MV Peak grad:  2.3 mmHg MV Mean grad:  1.0 mmHg    SHUNTS MV Vmax:       0.76 m/s    Systemic VTI:  0.18 m MV Vmean:      38.8 cm/s   Systemic Diam: 1.90 cm MV Decel Time: 152 msec MV E velocity: 36.80 cm/s MV A velocity: 71.10 cm/s MV E/A ratio:  0.52 Jenkins Rouge MD Electronically signed by Jenkins Rouge MD Signature Date/Time: 10/24/2020/4:24:10 PM    Final     Cardiac Studies   2d echo 10/24/20  1. Septal and apical akinesis preserved mid and basal function Prominent  trabeculations in apex consider f/u contrast study to r/o thormbus. Left  ventricular ejection fraction, by estimation, is 45 to 50%. The left  ventricle has mildly decreased  function. The left ventricle has no regional wall motion abnormalities.  The left ventricular internal cavity size was mildly dilated. Left  ventricular diastolic parameters are consistent with Grade I diastolic  dysfunction (impaired relaxation).   2. Right ventricular systolic function is normal. The right ventricular  size is normal. There is normal pulmonary artery systolic pressure.   3. Left atrial size was moderately  dilated.   4. The mitral valve is normal in structure. Mild mitral valve  regurgitation. No evidence of mitral stenosis.   5. The aortic valve was not well visualized. Aortic valve regurgitation  is not visualized. Mild to moderate aortic valve sclerosis/calcification  is present, without any evidence of aortic stenosis.  6. The inferior vena cava is normal in size with greater than 50%  respiratory variability, suggesting right atrial pressure of 3 mmHg.   Patient Profile     80 y.o. female with breast CA, DM, HLD, Sjogren's syndrome who was admitted with weakness and fall where she could not get up until home health aide arrived. Typically uses walker. Initial workup has shown hyponatremia, hyperkalemia, hypoalbuminemia, abnormal LFT (AST), mild renal insufficiency, hypomagnesemia, lactic acidosis, and elevated troponin values to a peak of 3405 (negative CK). 2D Echo showed EF 45-50%, grade 1 DD, mild MR, prominent trabeculations in apex.  Assessment & Plan    1. Weakness, fall, generalized frailty, agitation - present on admission: multiple metabolic disturbances noted above, hypotensive, frail - on 10/25/20 pt was noted to be agitated - she did hit her head on the guardrail during PT but agitation was noted prior to this per notes - CT head pending - other lab abnormalities this AM include continued hyponatremia, AKI with Cr 1.3, leukocytosis, declining anemia to 9.8. - general management including electrolyte management per primary team   2. Suspected subacute myocardial infarction - peak 3,405 - troponins typically higher than would be expected for demand ischemia - however, clinical picture is not entirely c/w ACS so suspect this is a concomitant problem rather than precipitating event for the hospitalization - yesterday's EKG consistent with evolving MI  - continue ASA, statin if able - consider addition of low dose beta blocker - Dr. Percival Spanish has recommended conservative  management given her frailty and confusion/agitation as well as lack of anginal symptoms - I agree with this assessment because she was refusing general care even this morning so would not presently be a candidate for catheterization - patient has not been heparinized in setting of fall, confusion   3. Newly recognized cardiomyopathy, mild MR - EF 45-50%, no prior to compare to, etiology unclear at this time - ischemic eval deferred pending improvement in overall clinical status - await clarification from MD- 2D echo study raised question of repeating limited study with contrast to exclude thrombus - continue to follow BP prior to med titration (hold off ACEI/ARB/ARNI/diuretic due to AKI but follow volume carefully)   4. Ulcer of right heel - ABIs normal, ? relationship to recent fall - further mgmt per primary team   5. Dyslipidemia - LDL controlled at 32, continue low dose PTA atorvastatin    For questions or updates, please contact [employer] Please consult [email or web address] for contact info under Cardiology/STEMI.  Signed, Charlie Pitter, PA-C 10/26/2020, 8:58 AM    History and all data above reviewed.  Patient examined.  I agree with the findings as above.   Still confused and very frail but better than yesterday.  Head CT without acute findings. Denies pain or SOB.  The patient exam reveals COR:  RRR  ,  Lungs: Clear  ,  Abd: Positive bowel sounds, no rebound no guarding, Ext No edema  .  All available labs, radiology testing, previous records reviewed. Agree with documented assessment and plan.   Probable anteroseptal MI:  Not clear when this was but she has had no symptoms and is not an invasive candidate.   Her BP will not allow med titration.     Jeneen Rinks Autumnrose Yore  1:18 PM  10/26/2020

## 2020-10-26 NOTE — Progress Notes (Signed)
Patient refusing assessment, v/s, FSBS this am, stating she would like to sleep longer, requesting for me to leave the room.

## 2020-10-29 ENCOUNTER — Ambulatory Visit: Payer: Medicare (Managed Care) | Admitting: Internal Medicine

## 2020-11-14 ENCOUNTER — Ambulatory Visit: Payer: Medicare (Managed Care) | Admitting: Internal Medicine

## 2020-11-19 ENCOUNTER — Encounter: Payer: Self-pay | Admitting: Internal Medicine

## 2020-11-19 ENCOUNTER — Ambulatory Visit (INDEPENDENT_AMBULATORY_CARE_PROVIDER_SITE_OTHER): Payer: Medicare (Managed Care) | Admitting: Internal Medicine

## 2020-11-19 ENCOUNTER — Other Ambulatory Visit: Payer: Self-pay

## 2020-11-19 VITALS — BP 131/80 | HR 91 | Resp 17 | Ht 63.0 in

## 2020-11-19 DIAGNOSIS — M3501 Sicca syndrome with keratoconjunctivitis: Secondary | ICD-10-CM | POA: Diagnosis not present

## 2020-11-19 DIAGNOSIS — H16003 Unspecified corneal ulcer, bilateral: Secondary | ICD-10-CM

## 2020-11-19 DIAGNOSIS — D649 Anemia, unspecified: Secondary | ICD-10-CM | POA: Diagnosis not present

## 2020-11-19 DIAGNOSIS — Z79899 Other long term (current) drug therapy: Secondary | ICD-10-CM

## 2020-11-19 NOTE — Progress Notes (Signed)
Office Visit Note  Patient: Terry Alexander             Date of Birth: 04-30-1940           MRN: [id number]             PCP: [facility] Referring: [place] Visit Date: 11/19/2020   Subjective:  Follow-up (Doing good)   History of Present Illness: Terry Alexander is a 80 y.o. 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  After our last visit she is for hospitalization in October due to severe generalized weakness where she was found to have significant hyponatremia and protein calorie deficits and edema.  Hospitalization was complicated with NSTEMI and some delirium. Since that hospitalization she has now transferred to a facility and possibly improvement in medication adherence. Overall her severe eye redness pain and inflammation and vision difficulty improved a lot since our last visit.   Previous HPI 09/19/20 Terry Alexander is a 80 y.o. female here for follow up for sjogren's syndrome after discontinuing methotrexate during the interval due to weight loss and lab abnormalities and subsequent improvement after stopping medication.  However since last visit she has worsening of the bilateral eye dryness and irritation with significant redness and swelling difficulty opening her eyes due to irritation.  She has not noticed significant changes to her skin or joints.    12/21/19 Terry Alexander is a 80 y.o. 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 Symptoms are now improving and she is continuing just ophthalmic treatment for this. During these past 2 months she also describes increased skin color changes on her arms, dry mouth, and since about 2 weeks ago increasing leg swelling which was evaluated in the ED that did  not find any blood clot. Overall most of these symptoms are new although she has had dry eyes and mouth for years without major complications. She denies hair loss, lymphadenopathy, fevers, shortness of breath, raynaud's phenomenon. She does report having some unintentional weight loss but that it is now improving.    Review of Systems  Constitutional:  Positive for fatigue.  HENT:  Negative for mouth dryness.   Eyes:  Negative for dryness.  Respiratory:  Negative for shortness of breath.   Cardiovascular:  Negative for swelling in legs/feet.  Gastrointestinal:  Positive for constipation.  Endocrine: Negative for excessive thirst.  Genitourinary:  Negative for difficulty urinating.  Musculoskeletal:  Positive for gait problem and muscle weakness.  Skin:  Negative for rash.  Allergic/Immunologic: Negative for susceptible to infections.  Neurological:  Negative for numbness.  Hematological:  Negative for bruising/bleeding tendency.  Psychiatric/Behavioral:  Negative for sleep disturbance.    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   Ptosis 04/04/2014   Vision changes  04/04/2014    Past Medical History:  Diagnosis Date   Breast cancer ([place])    Right side   Diabetes ([place])    High cholesterol    Sjogren's syndrome ([place])    Stomach cancer ([place])     Family History  Problem Relation Age of Onset   Heart attack Mother        Died 88    Prostate cancer Father    Heart attack Father        Died age 58   Heart attack Son 69   Past Surgical History:  Procedure Laterality Date   ABDOMINAL HYSTERECTOMY     MASTECTOMY Right 1977   OTHER SURGICAL HISTORY  1977   Right breast removed    Tumor removed     Stomach   Social History   Social History Narrative   Lives at home alone.   Right handed.   Caffeine use: soft drinks (2-3 per day)   Immunization History  Administered Date(s) Administered   Moderna Sars-Covid-2 Vaccination 02/15/2019, 03/15/2019, 12/22/2019     Objective: Vital Signs: BP 131/80 (BP Location: Right Arm, Patient Position: Sitting, Cuff Size: Normal)   Pulse 91   Resp 17   Ht 5\' 3"  (1.6 m)   BMI 22.03 kg/m    Physical Exam Eyes:     Comments: Conjunctive eye and sclera are clear bilaterally, some surface irregularity on cornea of both eyes without any specific inflammatory changes  Cardiovascular:     Rate and Rhythm: Normal rate and regular rhythm.  Pulmonary:     Effort: Pulmonary effort is normal.     Breath sounds: Normal breath sounds.  Musculoskeletal:     Right lower leg: Edema present.     Left lower leg: Edema present.     Comments: Anasarca with 1+ pitting edema in her forearms at the elbow down and bilateral 2+ pitting ankle edema  Skin:    General: Skin is warm and dry.     Findings: No rash.     Comments: Dark flaky hyperkeratotic skin present over edematous right ankle  Neurological:     Mental Status: She is alert.  Psychiatric:        Mood and Affect: Mood normal.      Investigation: No additional findings.  Imaging: CT HEAD WO CONTRAST (5MM)  Result Date: 10/26/2020 CLINICAL DATA:  Head trauma, abnormal mental status (Age 61-64y) EXAM: CT HEAD WITHOUT CONTRAST TECHNIQUE: Contiguous axial images were obtained from the base of the skull through the vertex without intravenous contrast. COMPARISON:  10/23/2020 FINDINGS: Brain: There is no acute intracranial  hemorrhage, mass effect, or edema. Gray-white differentiation is preserved. There is no extra-axial fluid collection. Ventricles and sulci are stable in size and configuration. Stable findings of probable chronic microvascular ischemic changes in the cerebral white matter. Vascular: There is atherosclerotic calcification at the skull base. Skull: Calvarium is unremarkable. Sinuses/Orbits: No acute finding. Other: Posterior scalp soft tissue swelling. IMPRESSION: No acute abnormality or significant change since recent prior study. Electronically Signed   By: Macy Mis M.D.   On: 10/26/2020 12:54   CT Head Wo Contrast  Result Date: 10/23/2020 CLINICAL DATA:  Mental status change, unknown cause EXAM: CT HEAD WITHOUT CONTRAST TECHNIQUE: Contiguous axial images were obtained from the base of the skull through the vertex without intravenous contrast. COMPARISON:  None. FINDINGS: Brain: No evidence of acute infarction, hemorrhage, hydrocephalus, extra-axial collection or mass lesion/mass effect. Moderate patchy white matter hypoattenuation, nonspecific but  compatible with chronic microvascular ischemic disease. Mild for age atrophy with ex vacuo ventricular dilation. Vascular: No hyperdense vessel identified. Calcific intracranial atherosclerosis. Skull: No acute fracture. Sinuses/Orbits: Visualized sinuses are clear. Other: No mastoid effusions. IMPRESSION: No evidence of acute intracranial abnormality. Electronically Signed   By: Margaretha Sheffield M.D.   On: 10/23/2020 13:40   MR BRAIN WO CONTRAST  Result Date: 10/23/2020 CLINICAL DATA:  Weakness EXAM: MRI HEAD WITHOUT CONTRAST TECHNIQUE: Multiplanar, multiecho pulse sequences of the brain and surrounding structures were obtained without intravenous contrast. COMPARISON:  None. FINDINGS: Brain: No acute infarct, mass effect or extra-axial collection. No acute or chronic hemorrhage. There is multifocal hyperintense T2-weighted signal within the white  matter. Generalized volume loss without a clear lobar predilection. The midline structures are normal. Vascular: Major flow voids are preserved. Skull and upper cervical spine: Normal calvarium and skull base. Visualized upper cervical spine and soft tissues are normal. Sinuses/Orbits:No paranasal sinus fluid levels or advanced mucosal thickening. No mastoid or middle ear effusion. Normal orbits. IMPRESSION: 1. No acute intracranial abnormality. 2. Generalized volume loss and findings of chronic small vessel disease. Electronically Signed   By: Ulyses Jarred M.D.   On: 10/23/2020 23:04   VAS [place] ABI WITH/WO TBI  Result Date: 10/24/2020  LOWER EXTREMITY DOPPLER STUDY Patient Name:  DELPHINE SIZEMORE  Date of Exam:   10/24/2020 Medical Rec #: [id number]    Accession #:    [id number] Date of Birth: [birth year]    Patient Gender: F Patient Age:   74 years Exam Location:  [facility] Procedure:      VAS [place] ABI WITH/WO TBI Referring Phys: ERIK HOFFMAN --------------------------------------------------------------------------------  Indications: Ulceration, and Weak pulses on palpation.  Limitations: Today's exam was limited due to skin changes resulting in poor              ultrasound/tissue interface. Comparison Study: No prior studies. Performing Technologist: Darlin Coco RDMS RVT  Examination Guidelines: A complete evaluation includes at minimum, Doppler waveform signals and systolic blood pressure reading at the level of bilateral brachial, anterior tibial, and posterior tibial arteries, when vessel segments are accessible. Bilateral testing is considered an integral part of a complete examination. Photoelectric Plethysmograph (PPG) waveforms and toe systolic pressure readings are included as required and additional duplex testing as needed. Limited examinations for reoccurring indications may be performed as noted.  ABI Findings: +--------+------------------+-----+---------+--------+ Right   Rt Pressure  (mmHg)IndexWaveform Comment  +--------+------------------+-----+---------+--------+ [id number]                    triphasic         +--------+------------------+-----+---------+--------+ PTA     116               1.06 biphasic          +--------+------------------+-----+---------+--------+ DP      124               1.14 triphasic         +--------+------------------+-----+---------+--------+ +--------+------------------+-----+----------------+---------------------------+ Left    Lt Pressure (mmHg)IndexWaveform        Comment                     +--------+------------------+-----+----------------+---------------------------+ [id number]                    triphasic                                   +--------+------------------+-----+----------------+---------------------------+  PTA     113               1.04 audibly biphasicSuboptimal signal due to                                                   skin changes.               +--------+------------------+-----+----------------+---------------------------+ DP      111               1.02 triphasic                                   +--------+------------------+-----+----------------+---------------------------+ +-------+-----------+-----------+------------+------------+ ABI/TBIToday's ABIToday's TBIPrevious ABIPrevious TBI +-------+-----------+-----------+------------+------------+ Right  1.14                                           +-------+-----------+-----------+------------+------------+ Left   1.04                                           +-------+-----------+-----------+------------+------------+  Summary: Right: Resting right ankle-brachial index is within normal range. No evidence of significant right lower extremity arterial disease. Left: Resting left ankle-brachial index is within normal range. No evidence of significant left lower extremity arterial disease.  *See table(s) above for  measurements and observations.  Electronically signed by Harold Barban MD on 10/24/2020 at 9:20:15 PM.    Final    ECHOCARDIOGRAM COMPLETE  Result Date: 10/24/2020    ECHOCARDIOGRAM REPORT   Patient Name:   DULCEMARIA BULA Date of Exam: 10/24/2020 Medical Rec #:  [id number]   Height:       63.0 in Accession #:    [id number]  Weight:       125.0 lb Date of Birth:  [birth year]   BSA:          1.584 m Patient Age:    23 years    BP:           104/57 mmHg Patient Gender: F           HR:           84 bpm. Exam Location:  Inpatient Procedure: 2D Echo, Cardiac Doppler and Color Doppler Indications:    Abnormal EKG  History:        Patient has no prior history of Echocardiogram examinations.                 Risk Factors:Diabetes and Dyslipidemia.  Sonographer:    Wenda Low Referring Phys: [place]  1. Septal and apical akinesis preserved mid and basal function Prominent trabeculations in apex consider f/u contrast study to r/o thormbus. Left ventricular ejection fraction, by estimation, is 45 to 50%. The left ventricle has mildly decreased function. The left ventricle has no regional wall motion abnormalities. The left ventricular internal cavity size was mildly dilated. Left ventricular diastolic parameters are consistent with Grade I diastolic dysfunction (impaired relaxation).  2. Right ventricular systolic function is normal. The right ventricular size is normal. There is normal pulmonary artery systolic  pressure.  3. Left atrial size was moderately dilated.  4. The mitral valve is normal in structure. Mild mitral valve regurgitation. No evidence of mitral stenosis.  5. The aortic valve was not well visualized. Aortic valve regurgitation is not visualized. Mild to moderate aortic valve sclerosis/calcification is present, without any evidence of aortic stenosis.  6. The inferior vena cava is normal in size with greater than 50% respiratory variability, suggesting right atrial pressure of 3  mmHg. FINDINGS  Left Ventricle: Septal and apical akinesis preserved mid and basal function Prominent trabeculations in apex consider f/u contrast study to r/o thormbus. Left ventricular ejection fraction, by estimation, is 45 to 50%. The left ventricle has mildly decreased function. The left ventricle has no regional wall motion abnormalities. The left ventricular internal cavity size was mildly dilated. There is no left ventricular hypertrophy. Left ventricular diastolic parameters are consistent with Grade I diastolic dysfunction (impaired relaxation). Right Ventricle: The right ventricular size is normal. No increase in right ventricular wall thickness. Right ventricular systolic function is normal. There is normal pulmonary artery systolic pressure. The tricuspid regurgitant velocity is 2.10 m/s, and  with an assumed right atrial pressure of 3 mmHg, the estimated right ventricular systolic pressure is 70.0 mmHg. Left Atrium: Left atrial size was moderately dilated. Right Atrium: Right atrial size was normal in size. Pericardium: There is no evidence of pericardial effusion. Mitral Valve: The mitral valve is normal in structure. Mild mitral valve regurgitation. No evidence of mitral valve stenosis. MV peak gradient, 2.3 mmHg. The mean mitral valve gradient is 1.0 mmHg. Tricuspid Valve: The tricuspid valve is normal in structure. Tricuspid valve regurgitation is trivial. No evidence of tricuspid stenosis. Aortic Valve: The aortic valve was not well visualized. Aortic valve regurgitation is not visualized. Mild to moderate aortic valve sclerosis/calcification is present, without any evidence of aortic stenosis. Aortic valve mean gradient measures 3.0 mmHg.  Aortic valve peak gradient measures 5.6 mmHg. Aortic valve area, by VTI measures 2.25 cm. Pulmonic Valve: The pulmonic valve was normal in structure. Pulmonic valve regurgitation is not visualized. No evidence of pulmonic stenosis. Aorta: The aortic root is  normal in size and structure. Venous: The inferior vena cava is normal in size with greater than 50% respiratory variability, suggesting right atrial pressure of 3 mmHg. IAS/Shunts: No atrial level shunt detected by color flow Doppler.  LEFT VENTRICLE PLAX 2D LVIDd:         3.20 cm     Diastology LVIDs:         2.40 cm     LV e' medial:    3.70 cm/s LV PW:         1.00 cm     LV E/e' medial:  9.9 LV IVS:        1.10 cm     LV e' lateral:   6.31 cm/s LVOT diam:     1.90 cm     LV E/e' lateral: 5.8 LV SV:         50 LV SV Index:   32 LVOT Area:     2.84 cm  LV Volumes (MOD) LV vol d, MOD A2C: 32.7 ml LV vol d, MOD A4C: 59.8 ml LV vol s, MOD A2C: 24.8 ml LV vol s, MOD A4C: 22.5 ml LV SV MOD A2C:     7.9 ml LV SV MOD A4C:     59.8 ml LV SV MOD BP:      20.8 ml RIGHT VENTRICLE RV Basal diam:  2.50 cm RV Mid diam:    2.10 cm LEFT ATRIUM             Index        RIGHT ATRIUM           Index LA diam:        4.30 cm 2.72 cm/m   RA Area:     11.70 cm LA Vol (A2C):   36.2 ml 22.86 ml/m  RA Volume:   20.70 ml  13.07 ml/m LA Vol (A4C):   42.1 ml 26.59 ml/m LA Biplane Vol: 41.0 ml 25.89 ml/m  AORTIC VALVE                    PULMONIC VALVE AV Area (Vmax):    2.07 cm     PV Vmax:       1.01 m/s AV Area (Vmean):   2.23 cm     PV Peak grad:  4.1 mmHg AV Area (VTI):     2.25 cm AV Vmax:           118.00 cm/s AV Vmean:          72.500 cm/s AV VTI:            0.224 m AV Peak Grad:      5.6 mmHg AV Mean Grad:      3.0 mmHg LVOT Vmax:         86.20 cm/s LVOT Vmean:        57.000 cm/s LVOT VTI:          0.178 m LVOT/AV VTI ratio: 0.79  AORTA Ao Root diam: 2.90 cm Ao Asc diam:  2.50 cm MITRAL VALVE               TRICUSPID VALVE MV Area (PHT): 4.99 cm    TR Peak grad:   17.6 mmHg MV Area VTI:   4.11 cm    TR Vmax:        210.00 cm/s MV Peak grad:  2.3 mmHg MV Mean grad:  1.0 mmHg    SHUNTS MV Vmax:       0.76 m/s    Systemic VTI:  0.18 m MV Vmean:      38.8 cm/s   Systemic Diam: 1.90 cm MV Decel Time: 152 msec MV E velocity:  36.80 cm/s MV A velocity: 71.10 cm/s MV E/A ratio:  0.52 Jenkins Rouge MD Electronically signed by Jenkins Rouge MD Signature Date/Time: 10/24/2020/4:24:10 PM    Final     Recent Labs: Lab Results  Component Value Date   WBC 11.8 (H) 10/26/2020   HGB 9.8 (L) 10/26/2020   PLT 229 10/26/2020   NA 132 (L) 10/26/2020   K 5.1 10/26/2020   CL 106 10/26/2020   CO2 12 (L) 10/26/2020   GLUCOSE 208 (H) 10/26/2020   BUN 16 10/26/2020   CREATININE 1.30 (H) 10/26/2020   BILITOT 1.2 10/23/2020   ALKPHOS 103 10/23/2020   AST 61 (H) 10/23/2020   ALT 41 10/23/2020   PROT 6.2 (L) 10/23/2020   ALBUMIN 2.6 (L) 10/23/2020   CALCIUM 8.0 (L) 10/26/2020   GFRAA 56 (L) 06/20/2020    Speciality Comments: No specialty comments available.  Procedures:  No procedures performed Allergies: Methotrexate derivatives and Codeine   Assessment / Plan:     Visit Diagnoses: Sjogren syndrome with keratoconjunctivitis (HCC)  Corneal melting of both eyes - Plan: CBC with Differential/Platelet, Thiopurine methyltransferase(tpmt)rbc  Symptoms appear dramatically improved compared to  our last visit eyes are completely clear. Interestingly also cleared some skin hyperpigmentation changes on her distal extremities. Due to recurrence of eye inflammation some kind of steroid sparing DMARD would be recommended if ophthalmology plans to taper down steroid eyedrops. I would consider starting low-dose azathioprine. Due to previous anemia need to check TPMT beforehand would not use if low function.  We will also need to clarify current medications, will coordinate plan with [facility]ACE provider this week.  High risk medication use - Plan: CBC with Differential/Platelet, Thiopurine methyltransferase(tpmt)rbc  Long-term use of this high dose prednisone exposes multiple additional side effect risk could be contributing to the recent hyponatremia and the edema as well.  However alternate DMARD treatment will need to be cautious for anemia was  worsened on methotrexate, would consider treatment with azathioprine systemically we will definitely need to screen for TPMT phenotype.  We were not able to draw ordered labs in office today due to difficult venous access on patient, recommended her PACE facility should be able to obtain this.   Orders: Orders Placed This Encounter  Procedures   CBC with Differential/Platelet   Thiopurine methyltransferase(tpmt)rbc    No orders of the defined types were placed in this encounter.    Follow-Up Instructions: No follow-ups on file.   Collier Salina, MD  Note - This record has been created using [employer].  Chart creation errors have been sought, but may not always  have been located. Such creation errors do not reflect on  the standard of medical care.

## 2020-11-19 NOTE — Progress Notes (Signed)
Cardiology Clinic Note   Patient Name: Terry Alexander Date of Encounter: 11/20/2020  Primary Care Provider:  [facility] [place] Primary Cardiologist:  Minus Breeding, MD  Patient Profile    Terry Alexander 80 year old female presents the clinic today for follow-up evaluation of her right coronary artery disease.  Past Medical History    Past Medical History:  Diagnosis Date   Breast cancer ([place])    Right side   Diabetes ([place])    High cholesterol    Sjogren's syndrome ([place])    Stomach cancer ([place])    Past Surgical History:  Procedure Laterality Date   ABDOMINAL HYSTERECTOMY     MASTECTOMY Right 1977   OTHER SURGICAL HISTORY  1977   Right breast removed    Tumor removed     Stomach    Allergies  Allergies  Allergen Reactions   Methotrexate Derivatives Other (See Comments)    Transaminitis, anemia, weight loss   Codeine Nausea And Vomiting    History of Present Illness    Terry Alexander has a PMH of breast cancer, diabetes, hyperlipidemia, and Sjogren's syndrome.  She was admitted to the hospital on 10/24/2020 and discharged on 10/26/2020.  She presented with symptoms of generalized weakness and a fall.  She was found down by her home health aide.  She typically uses a walker.  Her initial work-up showed hyponatremia, hyperkalemia, hypoalbuminemia, abnormal LFTs (AST), mild renal insufficiency, hypomagnesemia, lactic acidosis, and elevated troponins with a peak of 3405.  Her echocardiogram showed an EF of 45-50%, G1 DD, and mild MR.  Conservative management was recommended due to his frailty and confusion/agitation as well as her lack of anginal symptoms.  Titration of her beta blocker was recommended if tolerated.  ACE ARB or Arni and diuretic held due to AKI.  She presents to the clinic today for follow-up evaluation and states she is working with physical therapy several times per week.  She hopes that she will be able to go back home.  She reports  that her appetite has not returned to what it was prior to her hospitalization.  She does continue to feel somewhat fatigued.  I will give her the salty 6 diet sheet, have her continue to increase her physical activity as tolerated and continue her current medication.  We will plan follow-up for 3 to 4 months.  Today she denies chest pain, shortness of breath, lower extremity edema, fatigue, palpitations, melena, hematuria, hemoptysis, diaphoresis, weakness, presyncope, syncope, orthopnea, and PND.    Home Medications    Prior to Admission medications   Medication Sig Start Date End Date Taking? Authorizing Provider  acetaminophen (TYLENOL) 650 MG CR tablet Take 1,300 mg by mouth in the morning and at bedtime.    [provider]  Ascorbic Acid (VITAMIN C) 1000 MG tablet Take 1,000 mg by mouth daily.    [provider]  aspirin 81 MG tablet Take 81 mg by mouth daily.    [provider]  atorvastatin (LIPITOR) 10 MG tablet Take 10 mg by mouth daily.    [provider]  Cholecalciferol 10 MCG (400 UNIT) CAPS Take 1,000 Units by mouth daily.    [provider]  cycloSPORINE (RESTASIS) 0.05 % ophthalmic emulsion Place 1 drop into both eyes 2 (two) times daily.    [provider]  dorzolamide (TRUSOPT) 2 % ophthalmic solution Place 1 drop into both eyes 2 (two) times daily.    [provider]  doxycycline (VIBRA-TABS) 100 MG tablet Take 100 mg by mouth 2 (two) times daily.    [provider]  Emollient (LUBRIDERM SERIOUSLY SENSITIVE) LOTN Apply 1 application topically 4 (four) times daily as needed (dry skin).    [provider]  erythromycin ophthalmic ointment Place 1 application into the left eye 3 (three) times daily.    [provider]  fexofenadine (ALLEGRA) 180 MG tablet Take 180 mg by mouth daily as needed for allergies.    [provider]  folic acid (FOLVITE) 1 MG tablet Take 2 tablets (2 mg  total) by mouth daily. 06/25/20   Rice, Resa Miner, MD  gabapentin (NEURONTIN) 300 MG capsule Take 300 mg by mouth at bedtime.    [provider]  Insulin Pen Needle (NOVOFINE) 30G X 8 MM MISC Inject 1 packet into the skin as needed. Patient not taking: No sig reported    [provider]  lidocaine (LIDODERM) 5 % Place 1 patch onto the skin daily. Apple once daily to leg at night for leg pain    [provider]  losartan (COZAAR) 100 MG tablet Take 100 mg by mouth daily.    [provider]  Magnesium 400 MG TABS Take 400 mg by mouth daily.    [provider]  mirtazapine (REMERON) 15 MG tablet Take 15 mg by mouth at bedtime.    [provider]  moxifloxacin (VIGAMOX) 0.5 % ophthalmic solution Place 1 drop into both eyes daily.    [provider]  Nitroglycerin (RECTIV) 0.4 % OINT Place 1 application rectally in the morning and at bedtime.    [provider]  Nutritional Supplements (GLUCERNA 1.5 CAL PO) Take 8 oz by mouth in the morning and at bedtime.    [provider]  Ophthalmic Irrigation Solution (OCUSOFT EYE [place] OP) Apply 1 application to eye 4 (four) times daily as needed (eye exudate).    [provider]  pantoprazole (PROTONIX) 40 MG tablet Protonix 40 mg tablet,delayed release  Take 1 tablet every day by oral route.    [provider]  Polyethyl Glycol-Propyl Glycol (SYSTANE OP) Apply 1 drop to eye daily as needed (dry eyes).    [provider]  Polyethyl Glycol-Propyl Glycol (SYSTANE) 0.4-0.3 % GEL ophthalmic gel Place 1 application into both eyes at bedtime.    [provider]  polyethylene glycol (MIRALAX / GLYCOLAX) 17 g packet Take 17 g by mouth daily as needed for mild constipation.    [provider]  predniSONE (DELTASONE) 20 MG tablet Take 40 mg by mouth daily with breakfast.    [provider]  pyridostigmine (MESTINON) 60 MG tablet Take 1  tablet (60 mg total) by mouth 3 (three) times daily. Patient not taking: No sig reported 04/24/14   Melvenia Beam, MD  senna (SENOKOT) 8.6 MG TABS tablet Take 2 tablets by mouth at bedtime as needed for mild constipation.    [provider]  simethicone (MYLICON) 80 MG chewable tablet Chew 80 mg by mouth 4 (four) times daily as needed for flatulence.    [provider]  timolol (BETIMOL) 0.5 % ophthalmic solution Place 1 drop into both eyes 2 (two) times daily.    [provider]  White Petrolatum-Mineral Oil (SYSTANE NIGHTTIME) OINT Apply 1 application to eye 2 (two) times daily as needed (dry eye).    [provider]    Family History    Family History  Problem Relation  Age of Onset   Heart attack Mother        Died 88   Prostate cancer Father    Heart attack Father        Died age 56   Heart attack Son 39   She indicated that her mother is deceased. She indicated that her father is deceased. She indicated that all of her three sisters are alive. She indicated that only one of her four brothers is alive. She indicated that her son is deceased.  Social History    Social History   Socioeconomic History   Marital status: Widowed    Spouse name: Not on file   Number of children: 1   Years of education: 2 yr colle   Highest education level: Not on file  Occupational History   Occupation: Retired   Tobacco Use   Smoking status: Former    Packs/day: 1.00    Years: 20.00    Pack years: 20.00    Types: Cigarettes    Quit date: 01/14/1991    Years since quitting: 29.8   Smokeless tobacco: Never  Vaping Use   Vaping Use: Never used  Substance and Sexual Activity   Alcohol use: Yes    Comment: Wine every once in a while   Drug use: No   Sexual activity: Not on file  Other Topics Concern   Not on file  Social History Narrative   Lives at home alone.   Right handed.   Caffeine use: soft drinks (2-3 per day)   Social Determinants of  Health   Financial Resource Strain: Not on file  Food Insecurity: Not on file  Transportation Needs: Not on file  Physical Activity: Not on file  Stress: Not on file  Social Connections: Not on file  Intimate Partner Violence: Not on file     Review of Systems    General:  No chills, fever, night sweats or weight changes.  Cardiovascular:  No chest pain, dyspnea on exertion, edema, orthopnea, palpitations, paroxysmal nocturnal dyspnea. Dermatological: No rash, lesions/masses Respiratory: No cough, dyspnea Urologic: No hematuria, dysuria Abdominal:   No nausea, vomiting, diarrhea, bright red blood per rectum, melena, or hematemesis Neurologic:  No visual changes, wkns, changes in mental status. All other systems reviewed and are otherwise negative except as noted above.  Physical Exam    VS:  BP 123/72   Pulse 84   Ht 5\' 3"  (1.6 m)   BMI 22.03 kg/m  , BMI Body mass index is 22.03 kg/m. GEN: Well nourished, well developed, in no acute distress. HEENT: normal. Neck: Supple, no JVD, carotid bruits, or masses. Cardiac: RRR, no murmurs, rubs, or gallops. No clubbing, cyanosis, edema.  Radials/DP/PT 2+ and equal bilaterally.  Respiratory:  Respirations regular and unlabored, clear to auscultation bilaterally. GI: Soft, nontender, nondistended, BS + x 4. MS: no deformity or atrophy. Skin: warm and dry, no rash. Neuro:  Strength and sensation are intact. Psych: Normal affect.  Accessory Clinical Findings    Recent Labs: 10/23/2020: ALT 41 10/24/2020: TSH 3.369 10/26/2020: BUN 16; Creatinine, Ser 1.30; Hemoglobin 9.8; Magnesium 2.0; Platelets 229; Potassium 5.1; Sodium 132   Recent Lipid Panel    Component Value Date/Time   CHOL 101 10/24/2020 0447   TRIG 81 10/24/2020 0447   HDL 53 10/24/2020 0447   CHOLHDL 1.9 10/24/2020 0447   VLDL 16 10/24/2020 0447   LDLCALC 32 10/24/2020 0447    ECG personally reviewed by me today-none today.  Echocardiogram  10/24/20  IMPRESSIONS     1. Septal and apical akinesis preserved mid and basal function Prominent  trabeculations in apex consider f/u contrast study to r/o thormbus. Left  ventricular ejection fraction, by estimation, is 45 to 50%. The left  ventricle has mildly decreased  function. The left ventricle has no regional wall motion abnormalities.  The left ventricular internal cavity size was mildly dilated. Left  ventricular diastolic parameters are consistent with Grade I diastolic  dysfunction (impaired relaxation).   2. Right ventricular systolic function is normal. The right ventricular  size is normal. There is normal pulmonary artery systolic pressure.   3. Left atrial size was moderately dilated.   4. The mitral valve is normal in structure. Mild mitral valve  regurgitation. No evidence of mitral stenosis.   5. The aortic valve was not well visualized. Aortic valve regurgitation  is not visualized. Mild to moderate aortic valve sclerosis/calcification  is present, without any evidence of aortic stenosis.   6. The inferior vena cava is normal in size with greater than 50%  respiratory variability, suggesting right atrial pressure of 3 mmHg.  Assessment & Plan   1.  Suspected subacute MI-denies chest pain today.  Troponins peaked at 3405.  Not felt to be a good candidate for catheterization due to lack of symptoms, frailty, confusion, and agitation.   Continue aspirin, atorvastatin, nitroglycerin, losartan Heart healthy low-sodium diet-salty 6 given Increase physical activity as tolerated  Cardiomyopathy, mild MR-no increased DOE or activity intolerance.  EF on recent echocardiogram 45-50%. Continue losartan Heart healthy low-sodium diet-salty 6 given Increase physical activity as tolerated  Hyperlipidemia-10/24/2020: Cholesterol 101; HDL 53; LDL Cholesterol 32; Triglycerides 81; VLDL 16 Continue aspirin, atorvastatin Heart healthy low-sodium diet-salty 6 given Increase  physical activity as tolerated  Generalized weakness-increasing physical activity.  Has been working with physical therapy several times per week. Follows with PCP  Disposition: Follow-up with Dr. Percival Spanish or me in 3-4 months.  Terry Ng. Dorthia Tout NP-C    11/20/2020, 1:39 PM [facility] [place] [address] Office [phone] Fax [phone]  Notice: This dictation was prepared with Dragon dictation along with smaller phrase technology. Any transcriptional errors that result from this process are unintentional and may not be corrected upon review.  I spent 13 minutes examining this patient, reviewing medications, and using patient centered shared decision making involving her cardiac care.  Prior to her visit I spent greater than 20 minutes reviewing her past medical history,  medications, and prior cardiac tests.

## 2020-11-20 ENCOUNTER — Encounter: Payer: Self-pay | Admitting: General Practice

## 2020-11-20 ENCOUNTER — Ambulatory Visit (INDEPENDENT_AMBULATORY_CARE_PROVIDER_SITE_OTHER): Payer: Medicare (Managed Care) | Admitting: General Practice

## 2020-11-20 VITALS — BP 123/72 | HR 84 | Ht 63.0 in

## 2020-11-20 DIAGNOSIS — R7989 Other specified abnormal findings of blood chemistry: Secondary | ICD-10-CM

## 2020-11-20 DIAGNOSIS — I255 Ischemic cardiomyopathy: Secondary | ICD-10-CM

## 2020-11-20 DIAGNOSIS — I340 Nonrheumatic mitral (valve) insufficiency: Secondary | ICD-10-CM

## 2020-11-20 DIAGNOSIS — R778 Other specified abnormalities of plasma proteins: Secondary | ICD-10-CM | POA: Diagnosis not present

## 2020-11-20 DIAGNOSIS — R531 Weakness: Secondary | ICD-10-CM

## 2020-11-20 DIAGNOSIS — E782 Mixed hyperlipidemia: Secondary | ICD-10-CM

## 2020-11-20 NOTE — Patient Instructions (Signed)
Medication Instructions:  The current medical regimen is effective;  continue present plan and medications as directed. Please refer to the Current Medication list given to you today.   *If you need a refill on your cardiac medications before your next appointment, please call your pharmacy*  Lab Work:   Testing/Procedures:  NONE    NONE  Special Instructions PLEASE READ AND FOLLOW SALTY 6-ATTACHED-1,800mg  daily  PLEASE CONTINUE WITH YOUR PHYSICAL THERAPY SCHEDULE  Follow-Up: Your next appointment:  3-4 month(s) In Person with Minus Breeding, MD    At [facility], you and your health needs are our priority.  As part of our continuing mission to provide you with exceptional heart care, we have created designated Provider Care Teams.  These Care Teams include your primary Cardiologist (physician) and Advanced Practice Providers (APPs -  Physician Assistants and Nurse Practitioners) who all work together to provide you with the care you need, when you need it.  We recommend signing up for the patient portal called "MyChart".  Sign up information is provided on this After Visit Summary.  MyChart is used to connect with patients for Virtual Visits (Telemedicine).  Patients are able to view lab/test results, encounter notes, upcoming appointments, etc.  Non-urgent messages can be sent to your provider as well.   To learn more about what you can do with MyChart, go to [email or web address].              6 SALTY THINGS TO AVOID     1,800MG  DAILY

## 2020-11-22 ENCOUNTER — Other Ambulatory Visit: Payer: Self-pay

## 2020-11-22 ENCOUNTER — Encounter: Payer: Medicare (Managed Care) | Attending: Physician Assistant | Admitting: Physician Assistant

## 2020-11-22 DIAGNOSIS — I10 Essential (primary) hypertension: Secondary | ICD-10-CM | POA: Insufficient documentation

## 2020-11-22 DIAGNOSIS — L89610 Pressure ulcer of right heel, unstageable: Secondary | ICD-10-CM | POA: Diagnosis not present

## 2020-11-22 DIAGNOSIS — I2510 Atherosclerotic heart disease of native coronary artery without angina pectoris: Secondary | ICD-10-CM | POA: Insufficient documentation

## 2020-11-22 DIAGNOSIS — M6281 Muscle weakness (generalized): Secondary | ICD-10-CM | POA: Diagnosis not present

## 2020-11-22 DIAGNOSIS — E119 Type 2 diabetes mellitus without complications: Secondary | ICD-10-CM | POA: Diagnosis not present

## 2020-11-22 NOTE — Progress Notes (Signed)
AISHI, COURTS ([id number]) Visit Report for 11/22/2020 Chief Complaint Document Details Patient Name: Terry Alexander, Terry Alexander Date of Service: 11/22/2020 10:15 AM Medical Record Number: [id number] Patient Account Number: [id number] Date of Birth/Sex: 04-16-1940 (80 y.o. F) Treating RN: Donnamarie Poag Primary Care Provider: Barney Drain Other Clinician: Referring Provider: Barney Drain Treating Provider/Extender: Skipper Cliche in Treatment: 0 Information Obtained from: Patient Chief Complaint Right heel pressure ulcer Electronic Signature(s) Signed: 11/22/2020 11:10:05 AM By: Worthy Keeler PA-C Previous Signature: 11/22/2020 11:09:47 AM Version By: Worthy Keeler PA-C Entered By: Worthy Keeler on 11/22/2020 11:10:05 Terry Alexander, Terry Alexander ([id number]) -------------------------------------------------------------------------------- HPI Details Patient Name: Terry Alexander Date of Service: 11/22/2020 10:15 AM Medical Record Number: [id number] Patient Account Number: [id number] Date of Birth/Sex: 1940-05-22 (80 y.o. F) Treating RN: Donnamarie Poag Primary Care Provider: Barney Drain Other Clinician: Referring Provider: Barney Drain Treating Provider/Extender: Skipper Cliche in Treatment: 0 History of Present Illness HPI Description: 11/22/2020 upon evaluation today patient presents for initial inspection here in our clinic concerning issues that she is having with her right heel. This is a pressure ulcer that unfortunately developed when she was in the hospital due to cardiac issues. Subsequently the patient unfortunately has been dealing with this wound since discharge. There does not appear to be any signs of active infection which is great although right now [facility] has been used which I really do not think is doing much considering how the wound is situated and the amount of eschar present. This is fairly dry and stable I think that a aggressive debridement probably is not the right  way to go at the moment. Obviously if things breakdown more and the eschar becomes much more soft and pliable I think that we can definitely remove this but right now I do not think that the ideal way to go. Patient does have a history of diabetes mellitus type 2, hypertension, coronary artery disease, and generalized muscle weakness. Electronic Signature(s) Signed: 11/22/2020 1:08:46 PM By: Worthy Keeler PA-C Entered By: Worthy Keeler on 11/22/2020 13:08:46 Terry Alexander, Terry Alexander ([id number]) -------------------------------------------------------------------------------- Physical Exam Details Patient Name: Terry Alexander Date of Service: 11/22/2020 10:15 AM Medical Record Number: [id number] Patient Account Number: [id number] Date of Birth/Sex: 1940/06/25 (80 y.o. F) Treating RN: Donnamarie Poag Primary Care Provider: Barney Drain Other Clinician: Referring Provider: Barney Drain Treating Provider/Extender: Jeri Cos Weeks in Treatment: 0 Constitutional Well-nourished and well-hydrated in no acute distress. Eyes conjunctiva clear no eyelid edema noted. pupils equal round and reactive to light and accommodation. Ears, Nose, Mouth, and Throat no gross abnormality of ear auricles or external auditory canals. normal hearing noted during conversation. mucus membranes moist. Respiratory normal breathing without difficulty. Cardiovascular 2+ dorsalis pedis/posterior tibialis pulses. no clubbing, cyanosis, significant edema, <3 sec cap refill. Musculoskeletal Patient unable to walk without assistance. Psychiatric this patient is able to make decisions and demonstrates good insight into disease process. Alert and Oriented x 3. pleasant and cooperative. Notes Upon inspection patient's wound bed actually showed signs of being eschar covered as this is fairly dry and stable. I do not think that the ideal dressing would really be Hydrofera Blue at this point I think right now the ideal thing  probably is good to be for [place] to mainly try to keep the area dry and stable as long as we can. I discussed that with the patient today. We can send orders back as well. Electronic Signature(s) Signed: 11/22/2020 1:09:32 PM By: Worthy Keeler PA-C  Entered By: Worthy Keeler on 11/22/2020 13:09:32 Terry Alexander, Terry Alexander ([id number]) -------------------------------------------------------------------------------- Physician Orders Details Patient Name: Terry Alexander Date of Service: 11/22/2020 10:15 AM Medical Record Number: [id number] Patient Account Number: [id number] Date of Birth/Sex: 1940-03-04 (80 y.o. F) Treating RN: Donnamarie Poag Primary Care Provider: Barney Drain Other Clinician: Referring Provider: Barney Drain Treating Provider/Extender: Skipper Cliche in Treatment: 0 Verbal / Phone Orders: No Diagnosis Coding ICD-10 Coding Code Description L89.610 Pressure ulcer of right heel, unstageable I25.10 Atherosclerotic heart disease of native coronary artery without angina pectoris I10 Essential (primary) hypertension M62.81 Muscle weakness (generalized) Follow-up Appointments o Return Appointment in 3 weeks. Bathing/ Shower/ Hygiene o May shower; gently cleanse wound with antibacterial soap, rinse and pat dry prior to dressing wounds - keep dressing dry o No tub bath. Edema Control - Lymphedema / Segmental Compressive Device / Other o Elevate leg(s) parallel to the floor when sitting. Off-Loading o Heel suspension boot - Prevalon offload or equivalent needed for healing o Hospital bed/mattress o Low air-loss mattress (Group 2) - Recommend for heel and weight loss if available at SNF o Turn and reposition every 2 hours Additional Orders / Instructions o Follow Nutritious Diet and Increase Protein Intake - Increase protein, con't supplements Wound Treatment Wound #1 - Calcaneus Wound Laterality: Right Cleanser: Betadine-swabs 1 x Per Day/30 Days Discharge  Instructions: Apply Betadine as directed Cleanser: Soap and Water 1 x Per Day/30 Days Discharge Instructions: Gently cleanse wound with antibacterial soap, rinse and pat dry prior to dressing wounds Primary Dressing: Gauze 1 x Per Day/30 Days Discharge Instructions: dry gauze over the betadine Secured With: 84M Medipore H Soft Cloth Surgical Tape, 2x2 (in/yd) 1 x Per Day/30 Days Secured With: Kerlix Roll Sterile or Non-Sterile 6-ply 4.5x4 (yd/yd) 1 x Per Day/30 Days Discharge Instructions: Apply Kerlix as directed Radiology o X-ray, foot - XRAY AT SNF THE RIGHT FOOT PLAIN FILMS 3 view-non healing wound right foot mobile unit for immobility and fax results to wound center at [phone] - (ICD10 L89.610 - Pressure ulcer of right heel, unstageable) Notes AVOID THE ALLEVYN DRESSINGS AND USE THE GAUZE AND [facility] Electronic Signature(s) Terry Alexander, Terry Alexander ([id number]) Signed: 11/22/2020 4:07:12 PM By: Donnamarie Poag Signed: 11/22/2020 5:14:46 PM By: Worthy Keeler PA-C Entered By: Donnamarie Poag on 11/22/2020 11:24:14 Terry Alexander, Terry Alexander ([id number]) -------------------------------------------------------------------------------- Prescription 11/22/2020 Patient Name: Terry Alexander Provider: Jeri Cos PA-C Date of Birth: 1940/12/01 NPI#: [id number] Sex: F DEA#: [id number] Phone #: [phone] License #: Patient Address: [facility] [address], [place] [address] [address] [place], [place] [address] [phone] Allergies codeine Provider's Orders o X-ray, foot - ICD10: L89.610 - XRAY AT SNF THE RIGHT FOOT PLAIN FILMS 3 view-non healing wound right foot mobile unit for immobility and fax results to wound center at [phone] Hand Signature: Date(s): Electronic Signature(s) Signed: 11/22/2020 4:07:12 PM By: Donnamarie Poag Signed: 11/22/2020 5:14:46 PM By: Worthy Keeler PA-C Entered By: Donnamarie Poag on 11/22/2020 11:24:14 Terry Alexander ([id number]) --------------------------------------------------------------------------------  Problem List Details Patient Name: Terry Alexander Date of Service: 11/22/2020 10:15 AM Medical Record Number: [id number] Patient Account Number: [id number] Date of Birth/Sex: 07/29/1940 (80 y.o. F) Treating RN: Donnamarie Poag Primary Care Provider: Barney Drain Other Clinician: Referring Provider: Barney Drain Treating Provider/Extender: Skipper Cliche in Treatment: 0 Active Problems ICD-10 Encounter Code Description Active Date MDM Diagnosis L89.610 Pressure ulcer of right heel, unstageable 11/22/2020 No  Yes E11.621 Type 2 diabetes mellitus with foot ulcer 11/22/2020 No Yes I25.10 Atherosclerotic heart disease of native coronary artery without angina 11/22/2020 No Yes pectoris I10 Essential (primary) hypertension 11/22/2020 No Yes M62.81 Muscle weakness (generalized) 11/22/2020 No Yes Inactive Problems Resolved Problems Electronic Signature(s) Signed: 11/22/2020 1:08:26 PM By: Worthy Keeler PA-C Previous Signature: 11/22/2020 11:09:40 AM Version By: Worthy Keeler PA-C Entered By: Worthy Keeler on 11/22/2020 13:08:26 Terry Alexander, Terry Alexander ([id number]) -------------------------------------------------------------------------------- Progress Note Details Patient Name: Terry Alexander Date of Service: 11/22/2020 10:15 AM Medical Record Number: [id number] Patient Account Number: [id number] Date of Birth/Sex: [birth year] (80 y.o. F) Treating RN: Donnamarie Poag Primary Care Provider: Barney Drain Other Clinician: Referring Provider: Barney Drain Treating Provider/Extender: Skipper Cliche in Treatment: 0 Subjective Chief Complaint Information obtained from Patient Right heel pressure ulcer History of Present Illness (HPI) 11/22/2020 upon evaluation today patient presents for initial inspection here in our clinic concerning issues that she is  having with her right heel. This is a pressure ulcer that unfortunately developed when she was in the hospital due to cardiac issues. Subsequently the patient unfortunately has been dealing with this wound since discharge. There does not appear to be any signs of active infection which is great although right now [facility] has been used which I really do not think is doing much considering how the wound is situated and the amount of eschar present. This is fairly dry and stable I think that a aggressive debridement probably is not the right way to go at the moment. Obviously if things breakdown more and the eschar becomes much more soft and pliable I think that we can definitely remove this but right now I do not think that the ideal way to go. Patient does have a history of diabetes mellitus type 2, hypertension, coronary artery disease, and generalized muscle weakness. Patient History Information obtained from Patient, Chart. Allergies codeine (Severity: Moderate) Social History Former smoker - ended on 01/24/1951, Marital Status - Widowed, Alcohol Use - Rarely, Drug Use - No History, Caffeine Use - Rarely. Medical History Eyes Patient has history of Glaucoma Cardiovascular Patient has history of Coronary Artery Disease, Hypertension, Myocardial Infarction Endocrine Patient has history of Type II Diabetes Musculoskeletal Patient has history of Osteoarthritis Oncologic Denies history of Received Chemotherapy, Received Radiation Patient is treated with Controlled Diet. Blood sugar is tested. Review of Systems (ROS) Constitutional Symptoms (General Health) Denies complaints or symptoms of Fatigue, Fever, Chills, Marked Weight Change. Ear/Nose/Mouth/Throat Denies complaints or symptoms of Difficult clearing ears, Sinusitis. Hematologic/Lymphatic Denies complaints or symptoms of Bleeding / Clotting Disorders, Human Immunodeficiency Virus. Respiratory Denies complaints or symptoms  of Chronic or frequent coughs, Shortness of Breath. Cardiovascular Complains or has symptoms of LE edema. Gastrointestinal Denies complaints or symptoms of Frequent diarrhea, Nausea, Vomiting. Genitourinary Complains or has symptoms of Incontinence/dribbling. Immunological Denies complaints or symptoms of Hives, Itching. Integumentary (Skin) Denies complaints or symptoms of Wounds, Bleeding or bruising tendency, Breakdown, Swelling. Neurologic Denies complaints or symptoms of Numbness/parasthesias, Focal/Weakness. Oncologic mastectomu Righjt Psychiatric Denies complaints or symptoms of Anxiety, Claustrophobia. Terry Alexander, Terry Alexander ([id number]) Objective Constitutional Well-nourished and well-hydrated in no acute distress. Vitals Time Taken: 10:40 AM, Height: 65 in, Source: Stated. General Notes: unable to transfer/total lift Eyes conjunctiva clear no eyelid edema noted. pupils equal round and reactive to light and accommodation. Ears, Nose, Mouth, and Throat no gross abnormality of ear auricles or external auditory canals. normal hearing noted during conversation. mucus membranes moist. Respiratory normal breathing  without difficulty. Cardiovascular 2+ dorsalis pedis/posterior tibialis pulses. no clubbing, cyanosis, significant edema, Musculoskeletal Patient unable to walk without assistance. Psychiatric this patient is able to make decisions and demonstrates good insight into disease process. Alert and Oriented x 3. pleasant and cooperative. General Notes: Upon inspection patient's wound bed actually showed signs of being eschar covered as this is fairly dry and stable. I do not think that the ideal dressing would really be Hydrofera Blue at this point I think right now the ideal thing probably is good to be for [place] to mainly try to keep the area dry and stable as long as we can. I discussed that with the patient today. We can send orders back as well. Integumentary (Hair, Skin) Wound  #1 status is Open. Original cause of wound was Gradually Appeared. The date acquired was: 10/13/2020. The wound is located on the Right Calcaneus. The wound measures 5cm length x 7cm width x 0.1cm depth; 27.489cm^2 area and 2.749cm^3 volume. There is no tunneling or undermining noted. There is a none present amount of drainage noted. Foul odor after cleansing was noted. There is no granulation within the wound bed. There is a large (67-100%) amount of necrotic tissue within the wound bed including Eschar. Assessment Active Problems ICD-10 Pressure ulcer of right heel, unstageable Type 2 diabetes mellitus with foot ulcer Atherosclerotic heart disease of native coronary artery without angina pectoris Essential (primary) hypertension Muscle weakness (generalized) Plan Follow-up Appointments: Return Appointment in 3 weeks. Bathing/ Shower/ Hygiene: May shower; gently cleanse wound with antibacterial soap, rinse and pat dry prior to dressing wounds - keep dressing dry No tub bath. Edema Control - Lymphedema / Segmental Compressive Device / Other: Elevate leg(s) parallel to the floor when sitting. Off-LoadingAMEL, Terry Alexander ([id number]) Heel suspension boot - Prevalon offload or equivalent needed for healing Hospital bed/mattress Low air-loss mattress (Group 2) - Recommend for heel and weight loss if available at [facility] Turn and reposition every 2 hours Additional Orders / Instructions: Follow Nutritious Diet and Increase Protein Intake - Increase protein, con't supplements Radiology ordered were: X-ray, foot - XRAY AT SNF THE RIGHT FOOT PLAIN FILMS 3 view-non healing wound right foot mobile unit for immobility and fax results to wound center at [phone] General Notes: [place] #1: - Calcaneus Wound Laterality: Right Cleanser: Betadine-swabs 1 x Per Day/30 Days Discharge Instructions: Apply Betadine as directed Cleanser: Soap and  Water 1 x Per Day/30 Days Discharge Instructions: Gently cleanse wound with antibacterial soap, rinse and pat dry prior to dressing wounds Primary Dressing: Gauze 1 x Per Day/30 Days Discharge Instructions: dry gauze over the betadine Secured With: 20M Medipore H Soft Cloth Surgical Tape, 2x2 (in/yd) 1 x Per Day/30 Days Secured With: Kerlix Roll Sterile or Non-Sterile 6-ply 4.5x4 (yd/yd) 1 x Per Day/30 Days Discharge Instructions: Apply Kerlix as directed 1. Would recommend that we initiate Betadine paints daily to the heel and the right. 2. I am also can recommend that we have the patient actually continue with the dry gauze dressing followed by roll gauze to secure in place for padding. 3. I am actually good recommend as well a Prevalon offloading boot which I think will also be beneficial for the patient. 4. I would also suggest that we go ahead and have the patient focus on aggressive offloading which I think is still good to be of utmost importance. We will see patient back for reevaluation in 1 week here  in the clinic. If anything worsens or changes patient will contact our office for additional recommendations. Electronic Signature(s) Signed: 11/22/2020 1:10:43 PM By: Worthy Keeler PA-C Entered By: Worthy Keeler on 11/22/2020 13:10:43 Terry Alexander, Terry Alexander ([id number]) -------------------------------------------------------------------------------- ROS/PFSH Details Patient Name: Terry Alexander Date of Service: 11/22/2020 10:15 AM Medical Record Number: [id number] Patient Account Number: [id number] Date of Birth/Sex: November 01, 1940 (80 y.o. F) Treating RN: Donnamarie Poag Primary Care Provider: Barney Drain Other Clinician: Referring Provider: Barney Drain Treating Provider/Extender: Skipper Cliche in Treatment: 0 Information Obtained From Patient Chart Constitutional Symptoms (General Health) Complaints and Symptoms: Negative for: Fatigue; Fever; Chills; Marked Weight  Change Ear/Nose/Mouth/Throat Complaints and Symptoms: Negative for: Difficult clearing ears; Sinusitis Hematologic/Lymphatic Complaints and Symptoms: Negative for: Bleeding / Clotting Disorders; Human Immunodeficiency Virus Respiratory Complaints and Symptoms: Negative for: Chronic or frequent coughs; Shortness of Breath Cardiovascular Complaints and Symptoms: Positive for: LE edema Medical History: Positive for: Coronary Artery Disease; Hypertension; Myocardial Infarction Gastrointestinal Complaints and Symptoms: Negative for: Frequent diarrhea; Nausea; Vomiting Genitourinary Complaints and Symptoms: Positive for: Incontinence/dribbling Immunological Complaints and Symptoms: Negative for: Hives; Itching Integumentary (Skin) Complaints and Symptoms: Negative for: Wounds; Bleeding or bruising tendency; Breakdown; Swelling Neurologic Complaints and Symptoms: Negative for: Numbness/parasthesias; Focal/Weakness Psychiatric Terry Alexander, Terry Alexander ([id number]) Complaints and Symptoms: Negative for: Anxiety; Claustrophobia Eyes Medical History: Positive for: Glaucoma Endocrine Medical History: Positive for: Type II Diabetes Time with diabetes: 20 years Treated with: Diet Blood sugar tested every day: Yes Tested : Musculoskeletal Medical History: Positive for: Osteoarthritis Oncologic Complaints and Symptoms: Review of System Notes: mastectomu Righjt Medical History: Negative for: Received Chemotherapy; Received Radiation HBO Extended History Items Eyes: Glaucoma Immunizations Pneumococcal Vaccine: Received Pneumococcal Vaccination: Yes Received Pneumococcal Vaccination On or After 60th Birthday: Yes Implantable Devices None Family and Social History Former smoker - ended on 01/24/1951; Marital Status - Widowed; Alcohol Use: Rarely; Drug Use: No History; Caffeine Use: Rarely; Financial Concerns: No; Food, Clothing or Shelter Needs: No; Support System Lacking: No;  Transportation Concerns: No Electronic Signature(s) Signed: 11/22/2020 4:07:12 PM By: Donnamarie Poag Signed: 11/22/2020 5:14:46 PM By: Worthy Keeler PA-C Entered By: Donnamarie Poag on 11/22/2020 10:55:06 Terry Alexander, KIENITZ ([id number]) -------------------------------------------------------------------------------- SuperBill Details Patient Name: Terry Alexander Date of Service: 11/22/2020 Medical Record Number: [id number] Patient Account Number: [id number] Date of Birth/Sex: February 07, 1940 (80 y.o. F) Treating RN: Donnamarie Poag Primary Care Provider: Barney Drain Other Clinician: Referring Provider: Barney Drain Treating Provider/Extender: Skipper Cliche in Treatment: 0 Diagnosis Coding ICD-10 Codes Code Description L89.610 Pressure ulcer of right heel, unstageable I25.10 Atherosclerotic heart disease of native coronary artery without angina pectoris I10 Essential (primary) hypertension M62.81 Muscle weakness (generalized) Facility Procedures CPT4 Code: [id number] Description: 99214 - WOUND CARE VISIT-LEV 4 EST PT Modifier: Quantity: 1 Physician Procedures CPT4 Code: [id number] Description: WC PHYS LEVEL 3 o NEW PT Modifier: Quantity: 1 CPT4 Code: Description: ICD-10 Diagnosis Description L89.610 Pressure ulcer of right heel, unstageable I25.10 Atherosclerotic heart disease of native coronary artery without angi I10 Essential (primary) hypertension M62.81 Muscle weakness (generalized) Modifier: na pectoris Quantity: Electronic Signature(s) Signed: 11/22/2020 1:11:25 PM By: Worthy Keeler PA-C Entered By: Worthy Keeler on 11/22/2020 13:11:24

## 2020-11-22 NOTE — Progress Notes (Addendum)
NEOLA, WORRALL ([id number]) Visit Report for 11/22/2020 Allergy List Details Patient Name: Terry Alexander, Terry Alexander Date of Service: 11/22/2020 10:15 AM Medical Record Number: [id number] Patient Account Number: [id number] Date of Birth/Sex: 12-25-1940 (80 y.o. F) Treating RN: Donnamarie Poag Primary Care Sergey Ishler: Barney Drain Other Clinician: Referring Lonzell Dorris: Barney Drain Treating Hurbert Duran/Extender: Jeri Cos Weeks in Treatment: 0 Allergies Active Allergies codeine Severity: Moderate Allergy Notes Electronic Signature(s) Signed: 11/22/2020 4:07:12 PM By: Donnamarie Poag Entered By: Donnamarie Poag on 11/22/2020 10:41:32 ANIYIA, RANE ([id number]) -------------------------------------------------------------------------------- Arrival Information Details Patient Name: Terry Alexander Date of Service: 11/22/2020 10:15 AM Medical Record Number: [id number] Patient Account Number: [id number] Date of Birth/Sex: [birth year] (80 y.o. F) Treating RN: Donnamarie Poag Primary Care Ladan Vanderzanden: Barney Drain Other Clinician: Referring Tandy Grawe: Barney Drain Treating Amador Braddy/Extender: Skipper Cliche in Treatment: 0 Visit Information Patient Arrived: Wheel Chair Arrival Time: 10:36 Accompanied By: self Transfer Assistance: Harrel Lemon Lift Patient Identification Verified: Yes Secondary Verification Process Completed: Yes Patient Has Alerts: Yes Patient Alerts: Patient on Blood Thinner ASA 81mg  Diabetic HOYER/lives SNF [facility] ABI 10/24/20 Left 1.04/ Right 1.14 Electronic Signature(s) Signed: 11/22/2020 4:07:12 PM By: Donnamarie Poag Entered By: Donnamarie Poag on 11/22/2020 11:05:56 Terry Alexander ([id number]) -------------------------------------------------------------------------------- Clinic Level of Care Assessment Details Patient Name: Terry, Alexander Date of Service: 11/22/2020 10:15 AM Medical Record Number: [id number] Patient Account Number: [id number] Date of Birth/Sex: 06-Sep-1940 (80 y.o.  F) Treating RN: Donnamarie Poag Primary Care Molina Hollenback: Barney Drain Other Clinician: Referring Mireille Lacombe: Barney Drain Treating Kyal Arts/Extender: Skipper Cliche in Treatment: 0 Clinic Level of Care Assessment Items TOOL 2 Quantity Score []  - Use when only an EandM is performed on the INITIAL visit 0 ASSESSMENTS - Nursing Assessment / Reassessment []  - General Physical Exam (combine w/ comprehensive assessment (listed just below) when performed on new 0 pt. evals) []  - 0 Comprehensive Assessment (HX, ROS, Risk Assessments, Wounds Hx, etc.) ASSESSMENTS - Wound and Skin Assessment / Reassessment X - Simple Wound Assessment / Reassessment - one wound 1 5 []  - 0 Complex Wound Assessment / Reassessment - multiple wounds []  - 0 Dermatologic / Skin Assessment (not related to wound area) ASSESSMENTS - Ostomy and/or Continence Assessment and Care []  - Incontinence Assessment and Management 0 []  - 0 Ostomy Care Assessment and Management (repouching, etc.) PROCESS - Coordination of Care X - Simple Patient / Family Education for ongoing care 1 15 []  - 0 Complex (extensive) Patient / Family Education for ongoing care X- 1 10 Staff obtains Programmer, systems, Records, Test Results / Process Orders X- 1 10 Staff telephones HHA, Nursing Homes / Clarify orders / etc []  - 0 Routine Transfer to another Facility (non-emergent condition) []  - 0 Routine Hospital Admission (non-emergent condition) X- 1 15 New Admissions / Biomedical engineer / Ordering NPWT, Apligraf, etc. []  - 0 Emergency Hospital Admission (emergent condition) X- 1 10 Simple Discharge Coordination []  - 0 Complex (extensive) Discharge Coordination PROCESS - Special Needs []  - Pediatric / Minor Patient Management 0 []  - 0 Isolation Patient Management []  - 0 Hearing / Language / Visual special needs []  - 0 Assessment of Community assistance (transportation, D/C planning, etc.) []  - 0 Additional assistance / Altered  mentation X- 1 15 Support Surface(s) Assessment (bed, cushion, seat, etc.) INTERVENTIONS - Wound Cleansing / Measurement X - Wound Imaging (photographs - any number of wounds) 1 5 []  - 0 Wound Tracing (instead of photographs) X- 1 5 Simple Wound Measurement - one wound []  - 0 Complex Wound Measurement -  multiple wounds RIANNAH, STAGNER. ([id number]) X- 1 5 Simple Wound Cleansing - one wound []  - 0 Complex Wound Cleansing - multiple wounds INTERVENTIONS - Wound Dressings []  - Small Wound Dressing one or multiple wounds 0 X- 1 15 Medium Wound Dressing one or multiple wounds []  - 0 Large Wound Dressing one or multiple wounds []  - 0 Application of Medications - injection INTERVENTIONS - Miscellaneous []  - External ear exam 0 []  - 0 Specimen Collection (cultures, biopsies, blood, body fluids, etc.) []  - 0 Specimen(s) / Culture(s) sent or taken to Lab for analysis X- 1 10 Patient Transfer (multiple staff / Harrel Lemon Lift / Similar devices) []  - 0 Simple Staple / Suture removal (25 or less) []  - 0 Complex Staple / Suture removal (26 or more) []  - 0 Hypo / Hyperglycemic Management (close monitor of Blood Glucose) []  - 0 Ankle / Brachial Index (ABI) - do not check if billed separately Has the patient been seen at the hospital within the last three years: Yes Total Score: 120 Level Of Care: New/Established - Level 4 Electronic Signature(s) Signed: 11/22/2020 4:07:12 PM By: Donnamarie Poag Entered By: Donnamarie Poag on 11/22/2020 11:25:14 Terry Alexander ([id number]) -------------------------------------------------------------------------------- Encounter Discharge Information Details Patient Name: Terry Alexander Date of Service: 11/22/2020 10:15 AM Medical Record Number: [id number] Patient Account Number: [id number] Date of Birth/Sex: 1940/12/19 (80 y.o. F) Treating RN: Donnamarie Poag Primary Care Dorothy Polhemus: Barney Drain Other Clinician: Referring Jenney Brester: Barney Drain Treating  Trevar Boehringer/Extender: Skipper Cliche in Treatment: 0 Encounter Discharge Information Items Discharge Condition: Stable Ambulatory Status: Wheelchair Discharge Destination: Skilled Nursing Facility Telephoned: No Orders Sent: Yes Transportation: Other Accompanied By: driver Schedule Follow-up Appointment: Yes Clinical Summary of Care: Electronic Signature(s) Signed: 11/22/2020 4:07:12 PM By: Donnamarie Poag Entered By: Donnamarie Poag on 11/22/2020 11:35:31 Terry Alexander ([id number]) -------------------------------------------------------------------------------- Lower Extremity Assessment Details Patient Name: Terry Alexander Date of Service: 11/22/2020 10:15 AM Medical Record Number: [id number] Patient Account Number: [id number] Date of Birth/Sex: July 03, 1940 (80 y.o. F) Treating RN: Donnamarie Poag Primary Care Finesse Fielder: Barney Drain Other Clinician: Referring Aashika Carta: Barney Drain Treating Fannye Myer/Extender: Jeri Cos Weeks in Treatment: 0 Edema Assessment Assessed: Shirlyn Goltz: Yes] [Right: Yes] Edema: [Left: N] [Right: o] Calf Left: Right: Point of Measurement: 31 cm From Medial Instep 34.5 cm Ankle Left: Right: Point of Measurement: 10 cm From Medial Instep 22 cm Knee To Floor Left: Right: From Medial Instep 40 cm Vascular Assessment Pulses: Dorsalis Pedis Palpable: [Left:Yes] Electronic Signature(s) Signed: 11/22/2020 4:07:12 PM By: Donnamarie Poag Entered By: Donnamarie Poag on 11/22/2020 10:47:45 Terry Alexander ([id number]) -------------------------------------------------------------------------------- Multi Wound Chart Details Patient Name: Terry Alexander Date of Service: 11/22/2020 10:15 AM Medical Record Number: [id number] Patient Account Number: [id number] Date of Birth/Sex: 06-Apr-1940 (80 y.o. F) Treating RN: Donnamarie Poag Primary Care Hasel Janish: Barney Drain Other Clinician: Referring Maxie Debose: Barney Drain Treating Avanish Cerullo/Extender: Skipper Cliche in Treatment:  0 Photos: [N/A:N/A] Wound Location: Right Calcaneus N/A N/A Wounding Event: Gradually Appeared N/A N/A Primary Etiology: Pressure Ulcer N/A N/A Secondary Etiology: Diabetic Wound/Ulcer of the Lower N/A N/A Extremity Comorbid History: Glaucoma, Coronary Artery N/A N/A Disease, Hypertension, Myocardial Infarction, Type II Diabetes, Osteoarthritis Date Acquired: 10/13/2020 N/A N/A Weeks of Treatment: 0 N/A N/A Wound Status: Open N/A N/A Measurements L x W x D (cm) 5x7x0.1 N/A N/A Area (cm) : 27.489 N/A N/A Volume (cm) : 2.749 N/A N/A Classification: Unstageable/Unclassified N/A N/A Exudate Amount: None Present N/A N/A Foul Odor After Cleansing: Yes N/A N/A Odor Anticipated Due  to Product No N/A N/A Use: Granulation Amount: None Present (0%) N/A N/A Necrotic Amount: Large (67-100%) N/A N/A Necrotic Tissue: Eschar N/A N/A Treatment Notes Electronic Signature(s) Signed: 11/22/2020 4:07:12 PM By: Donnamarie Poag Entered By: Donnamarie Poag on 11/22/2020 11:18:58 Terry Alexander ([id number]) -------------------------------------------------------------------------------- [place] Details Patient Name: Terry Alexander Date of Service: 11/22/2020 10:15 AM Medical Record Number: [id number] Patient Account Number: [id number] Date of Birth/Sex: 1940-09-11 (80 y.o. F) Treating RN: Donnamarie Poag Primary Care Donnalynn Wheeless: Barney Drain Other Clinician: Referring Valerya Maxton: Barney Drain Treating Ashleynicole Mcclees/Extender: Skipper Cliche in Treatment: 0 Active Inactive Electronic Signature(s) Signed: 12/21/2020 9:40:29 AM By: Gretta Cool, BSN, RN, CWS, Kim RN, BSN Signed: 01/02/2021 4:32:57 PM By: Donnamarie Poag Previous Signature: 11/22/2020 4:07:12 PM Version By: Donnamarie Poag Entered By: Gretta Cool BSN, RN, CWS, Kim on 12/21/2020 09:40:29 LIBERTIE, HAUSLER ([id number]) -------------------------------------------------------------------------------- Pain Assessment Details Patient Name: Terry Alexander Date of Service: 11/22/2020 10:15 AM Medical Record Number: [id number] Patient Account Number: [id number] Date of Birth/Sex: [birth year] (80 y.o. F) Treating RN: Donnamarie Poag Primary Care Mathan Darroch: Barney Drain Other Clinician: Referring Genesi Stefanko: Barney Drain Treating Meryem Haertel/Extender: Skipper Cliche in Treatment: 0 Active Problems Location of Pain Severity and Description of Pain Patient Has Paino No Site Locations Rate the pain. Current Pain Level: 0 Pain Management and Medication Current Pain Management: Electronic Signature(s) Signed: 11/22/2020 4:07:12 PM By: Donnamarie Poag Entered By: Donnamarie Poag on 11/22/2020 10:39:48 Terry Alexander ([id number]) -------------------------------------------------------------------------------- Patient/Caregiver Education Details Patient Name: Terry Alexander Date of Service: 11/22/2020 10:15 AM Medical Record Number: [id number] Patient Account Number: [id number] Date of Birth/Gender: [birth year] (80 y.o. F) Treating RN: Donnamarie Poag Primary Care Physician: Barney Drain Other Clinician: Referring Physician: Barney Drain Treating Physician/Extender: Skipper Cliche in Treatment: 0 Education Assessment Education Provided To: Patient and Caregiver SNF Education Topics Provided Basic Hygiene: Elevated Blood Sugar/ Impact on Healing: Nutrition: Offloading: Welcome [facility]: Wound/Skin Impairment: Electronic Signature(s) Signed: 11/22/2020 4:07:12 PM By: Donnamarie Poag Entered By: Donnamarie Poag on 11/22/2020 11:25:47 Terry Alexander ([id number]) -------------------------------------------------------------------------------- Wound Assessment Details Patient Name: Terry Alexander Date of Service: 11/22/2020 10:15 AM Medical Record Number: [id number] Patient Account Number: [id number] Date of Birth/Sex: [birth year] (80 y.o. F) Treating RN: Donnamarie Poag Primary Care Malin Sambrano: Barney Drain Other Clinician: Referring  Jiah Bari: Barney Drain Treating Anara Cowman/Extender: Jeri Cos Weeks in Treatment: 0 Wound Status Wound Number: 1 Primary Pressure Ulcer Etiology: Wound Location: Right Calcaneus Secondary Diabetic Wound/Ulcer of the Lower Extremity Wounding Event: Gradually Appeared Etiology: Date Acquired: 10/13/2020 Wound Status: Open Weeks Of Treatment: 0 Comorbid Glaucoma, Coronary Artery Disease, Hypertension, Clustered Wound: No History: Myocardial Infarction, Type II Diabetes, Osteoarthritis Photos Wound Measurements Length: (cm) 5 Width: (cm) 7 Depth: (cm) 0.1 Area: (cm) 27.489 Volume: (cm) 2.749 % Reduction in Area: % Reduction in Volume: Tunneling: No Undermining: No Wound Description Classification: Unstageable/Unclassified Exudate Amount: None Present Foul Odor After Cleansing: Yes Due to Product Use: No Slough/Fibrino Yes Wound Bed Granulation Amount: None Present (0%) Necrotic Amount: Large (67-100%) Necrotic Quality: Eschar Electronic Signature(s) Signed: 11/22/2020 4:07:12 PM By: Donnamarie Poag Entered By: Donnamarie Poag on 11/22/2020 10:56:46 Terry Alexander ([id number]) -------------------------------------------------------------------------------- Vitals Details Patient Name: Terry Alexander Date of Service: 11/22/2020 10:15 AM Medical Record Number: [id number] Patient Account Number: [id number] Date of Birth/Sex: [birth year] (80 y.o. F) Treating RN: Donnamarie Poag Primary Care Frona Yost: Barney Drain Other Clinician: Referring Philo Kurtz: Barney Drain Treating Makyi Ledo/Extender: Jeri Cos Weeks in Treatment: 0 Vital Signs Time Taken: 10:40 Reference  Range: 80 - 120 mg / dl Height (in): 65 Source: Stated Unable to obtain height and weight: Medical Reason Notes unable to transfer/total lift Electronic Signature(s) Signed: 11/22/2020 4:07:12 PM By: Donnamarie Poag Entered ByDonnamarie Poag on 11/22/2020 10:40:47

## 2020-11-22 NOTE — Progress Notes (Signed)
ALYXANDRA, TENBRINK ([id number]) Visit Report for 11/22/2020 Abuse/Suicide Risk Screen Details Patient Name: Terry Alexander, Terry Alexander Date of Service: 11/22/2020 10:15 AM Medical Record Number: [id number] Patient Account Number: [id number] Date of Birth/Sex: [birth year] (80 y.o. F) Treating RN: Donnamarie Poag Primary Care Earlean Fidalgo: Barney Drain Other Clinician: Referring Temika Sutphin: Barney Drain Treating Akila Batta/Extender: Skipper Cliche in Treatment: 0 Abuse/Suicide Risk Screen Items Answer ABUSE RISK SCREEN: Has anyone close to you tried to hurt or harm you recentlyo No Do you feel uncomfortable with anyone in your familyo No Has anyone forced you do things that you didnot want to doo No Electronic Signature(s) Signed: 11/22/2020 4:07:12 PM By: Donnamarie Poag Entered By: Donnamarie Poag on 11/22/2020 10:48:47 Terry Alexander ([id number]) -------------------------------------------------------------------------------- Activities of Daily Living Details Patient Name: Terry Alexander Date of Service: 11/22/2020 10:15 AM Medical Record Number: [id number] Patient Account Number: [id number] Date of Birth/Sex: 1940/08/19 (80 y.o. F) Treating RN: Donnamarie Poag Primary Care Lener Ventresca: Barney Drain Other Clinician: Referring Cheng Dec: Barney Drain Treating Jerrold Haskell/Extender: Skipper Cliche in Treatment: 0 Activities of Daily Living Items Answer Activities of Daily Living (Please select one for each item) Drive Automobile Not Able Take Medications Need Assistance Use Telephone Need Assistance Care for Appearance Need Assistance Use Toilet Need Assistance Bath / Shower Need Assistance Dress Self Need Assistance Feed Self Need Assistance Walk Not Able Get In / Out Bed Need Assistance Housework Not Able Prepare Meals Not Able Handle Money Not Able Shop for Self Not Able Electronic Signature(s) Signed: 11/22/2020 4:07:12 PM By: Donnamarie Poag Entered By: Donnamarie Poag on 11/22/2020 10:49:19 Terry Alexander  ([id number]) -------------------------------------------------------------------------------- Education Screening Details Patient Name: Terry Alexander Date of Service: 11/22/2020 10:15 AM Medical Record Number: [id number] Patient Account Number: [id number] Date of Birth/Sex: 11/19/1940 (80 y.o. F) Treating RN: Donnamarie Poag Primary Care Renie Stelmach: Barney Drain Other Clinician: Referring Moiz Ryant: Barney Drain Treating Labella Zahradnik/Extender: Skipper Cliche in Treatment: 0 Primary Learner Assessed: Patient Learning Preferences/Education Level/Primary Language Learning Preference: Explanation Highest Education Level: College or Above Preferred Language: English Cognitive Barrier Language Barrier: No Translator Needed: No Memory Deficit: Yes Emotional Barrier: No Cultural/Religious Beliefs Affecting Medical Care: No Physical Barrier Impaired Vision: No Impaired Hearing: No Decreased Hand dexterity: Yes Knowledge/Comprehension Knowledge Level: Medium Comprehension Level: Medium Ability to understand written instructions: Medium Ability to understand verbal instructions: Medium Motivation Anxiety Level: Calm Cooperation: Cooperative Education Importance: Acknowledges Need Interest in Health Problems: Asks Questions Perception: Coherent Willingness to Engage in Self-Management Medium Activities: Readiness to Engage in Self-Management Medium Activities: Electronic Signature(s) Signed: 11/22/2020 4:07:12 PM By: Donnamarie Poag Entered By: Donnamarie Poag on 11/22/2020 10:50:07 Terry Alexander ([id number]) -------------------------------------------------------------------------------- Fall Risk Assessment Details Patient Name: Terry Alexander Date of Service: 11/22/2020 10:15 AM Medical Record Number: [id number] Patient Account Number: [id number] Date of Birth/Sex: 04/25/1940 (80 y.o. F) Treating RN: Donnamarie Poag Primary Care Trystin Terhune: Barney Drain Other Clinician: Referring Merlin Ege:  Barney Drain Treating Powell Halbert/Extender: Skipper Cliche in Treatment: 0 Fall Risk Assessment Items Have you had 2 or more falls in the last 12 monthso 0 No Have you had any fall that resulted in injury in the last 12 monthso 0 No FALLS RISK SCREEN History of falling - immediate or within 3 months 0 No Secondary diagnosis (Do you have 2 or more medical diagnoseso) 15 Yes Ambulatory aid None/bed rest/wheelchair/nurse 0 Yes Crutches/cane/walker 0 No Furniture 0 No Intravenous therapy Access/Saline/Heparin Lock 0 No Gait/Transferring Normal/ bed rest/ wheelchair 0 Yes Weak (short steps with or without shuffle,  stooped but able to lift head while walking, may 0 No seek support from furniture) Impaired (short steps with shuffle, may have difficulty arising from chair, head down, impaired 0 No balance) Mental Status Oriented to own ability 0 No Electronic Signature(s) Signed: 11/22/2020 4:07:12 PM By: Donnamarie Poag Entered By: Donnamarie Poag on 11/22/2020 10:50:28 Terry Alexander ([id number]) -------------------------------------------------------------------------------- Foot Assessment Details Patient Name: Terry Alexander Date of Service: 11/22/2020 10:15 AM Medical Record Number: [id number] Patient Account Number: [id number] Date of Birth/Sex: [birth year] (80 y.o. F) Treating RN: Donnamarie Poag Primary Care Jazia Faraci: Barney Drain Other Clinician: Referring Pam Vanalstine: Barney Drain Treating Quetzalli Clos/Extender: Skipper Cliche in Treatment: 0 Foot Assessment Items Site Locations + = Sensation present, - = Sensation absent, C = Callus, U = Ulcer R = Redness, W = Warmth, M = Maceration, PU = Pre-ulcerative lesion F = Fissure, S = Swelling, D = Dryness Assessment Right: Left: Other Deformity: No No Prior Foot Ulcer: No No Prior Amputation: No No Charcot Joint: No No Ambulatory Status: Non-ambulatory Assistance Device: Wheelchair Gait: Electronic Signature(s) Signed:  11/22/2020 4:07:12 PM By: Donnamarie Poag Entered By: Donnamarie Poag on 11/22/2020 10:52:03 Terry Alexander ([id number]) -------------------------------------------------------------------------------- Nutrition Risk Screening Details Patient Name: Terry Alexander Date of Service: 11/22/2020 10:15 AM Medical Record Number: [id number] Patient Account Number: [id number] Date of Birth/Sex: July 04, 1940 (80 y.o. F) Treating RN: Donnamarie Poag Primary Care Navia Lindahl: Barney Drain Other Clinician: Referring Austen Wygant: Barney Drain Treating Liev Brockbank/Extender: Jeri Cos Weeks in Treatment: 0 Height (in): 65 Weight (lbs): Body Mass Index (BMI): Nutrition Risk Screening Items Score Screening NUTRITION RISK SCREEN: I have an illness or condition that made me change the kind and/or amount of food I eat 0 No I eat fewer than two meals per day 0 No I eat few fruits and vegetables, or milk products 0 No I have three or more drinks of beer, liquor or wine almost every day 0 No I have tooth or mouth problems that make it hard for me to eat 2 Yes I don't always have enough money to buy the food I need 0 No I eat alone most of the time 1 Yes I take three or more different prescribed or over-the-counter drugs a day 0 No Without wanting to, I have lost or gained 10 pounds in the last six months 2 Yes I am not always physically able to shop, cook and/or feed myself 0 No Nutrition Protocols Good Risk Protocol Moderate Risk Protocol 0 Provide education on nutrition High Risk Proctocol Risk Level: Moderate Risk Score: 5 Notes states no appetite Electronic Signature(s) Signed: 11/22/2020 4:07:12 PM By: Donnamarie Poag Entered By: Donnamarie Poag on 11/22/2020 10:50:51

## 2020-11-25 ENCOUNTER — Encounter (HOSPITAL_COMMUNITY): Payer: Self-pay | Admitting: Emergency Medicine

## 2020-11-25 ENCOUNTER — Other Ambulatory Visit: Payer: Self-pay

## 2020-11-25 ENCOUNTER — Emergency Department (HOSPITAL_COMMUNITY): Payer: Medicare (Managed Care)

## 2020-11-25 ENCOUNTER — Inpatient Hospital Stay (HOSPITAL_COMMUNITY)
Admission: EM | Admit: 2020-11-25 | Discharge: 2020-11-30 | DRG: 871 | Disposition: A | Discharge status: Skilled Nursing Facility | Payer: Medicare (Managed Care) | Source: Skilled Nursing Facility | Attending: Internal Medicine | Admitting: Internal Medicine

## 2020-11-25 DIAGNOSIS — K219 Gastro-esophageal reflux disease without esophagitis: Secondary | ICD-10-CM | POA: Diagnosis present

## 2020-11-25 DIAGNOSIS — Z79899 Other long term (current) drug therapy: Secondary | ICD-10-CM

## 2020-11-25 DIAGNOSIS — Z794 Long term (current) use of insulin: Secondary | ICD-10-CM

## 2020-11-25 DIAGNOSIS — Z20822 Contact with and (suspected) exposure to covid-19: Secondary | ICD-10-CM | POA: Diagnosis present

## 2020-11-25 DIAGNOSIS — I130 Hypertensive heart and chronic kidney disease with heart failure and stage 1 through stage 4 chronic kidney disease, or unspecified chronic kidney disease: Secondary | ICD-10-CM | POA: Diagnosis present

## 2020-11-25 DIAGNOSIS — T380X5A Adverse effect of glucocorticoids and synthetic analogues, initial encounter: Secondary | ICD-10-CM | POA: Diagnosis present

## 2020-11-25 DIAGNOSIS — Z90710 Acquired absence of both cervix and uterus: Secondary | ICD-10-CM

## 2020-11-25 DIAGNOSIS — E7800 Pure hypercholesterolemia, unspecified: Secondary | ICD-10-CM | POA: Diagnosis present

## 2020-11-25 DIAGNOSIS — Z85028 Personal history of other malignant neoplasm of stomach: Secondary | ICD-10-CM

## 2020-11-25 DIAGNOSIS — J9611 Chronic respiratory failure with hypoxia: Secondary | ICD-10-CM | POA: Diagnosis present

## 2020-11-25 DIAGNOSIS — Z7952 Long term (current) use of systemic steroids: Secondary | ICD-10-CM

## 2020-11-25 DIAGNOSIS — R0602 Shortness of breath: Secondary | ICD-10-CM

## 2020-11-25 DIAGNOSIS — E11621 Type 2 diabetes mellitus with foot ulcer: Secondary | ICD-10-CM | POA: Diagnosis present

## 2020-11-25 DIAGNOSIS — I959 Hypotension, unspecified: Secondary | ICD-10-CM | POA: Diagnosis present

## 2020-11-25 DIAGNOSIS — L89890 Pressure ulcer of other site, unstageable: Secondary | ICD-10-CM | POA: Diagnosis present

## 2020-11-25 DIAGNOSIS — Z87891 Personal history of nicotine dependence: Secondary | ICD-10-CM

## 2020-11-25 DIAGNOSIS — N1831 Chronic kidney disease, stage 3a: Secondary | ICD-10-CM | POA: Diagnosis present

## 2020-11-25 DIAGNOSIS — D649 Anemia, unspecified: Secondary | ICD-10-CM | POA: Diagnosis present

## 2020-11-25 DIAGNOSIS — I5042 Chronic combined systolic (congestive) and diastolic (congestive) heart failure: Secondary | ICD-10-CM | POA: Diagnosis present

## 2020-11-25 DIAGNOSIS — Z853 Personal history of malignant neoplasm of breast: Secondary | ICD-10-CM

## 2020-11-25 DIAGNOSIS — L89610 Pressure ulcer of right heel, unstageable: Secondary | ICD-10-CM | POA: Diagnosis present

## 2020-11-25 DIAGNOSIS — Z8249 Family history of ischemic heart disease and other diseases of the circulatory system: Secondary | ICD-10-CM

## 2020-11-25 DIAGNOSIS — E119 Type 2 diabetes mellitus without complications: Secondary | ICD-10-CM

## 2020-11-25 DIAGNOSIS — Z885 Allergy status to narcotic agent status: Secondary | ICD-10-CM

## 2020-11-25 DIAGNOSIS — I252 Old myocardial infarction: Secondary | ICD-10-CM

## 2020-11-25 DIAGNOSIS — Z7982 Long term (current) use of aspirin: Secondary | ICD-10-CM

## 2020-11-25 DIAGNOSIS — Z888 Allergy status to other drugs, medicaments and biological substances status: Secondary | ICD-10-CM

## 2020-11-25 DIAGNOSIS — J189 Pneumonia, unspecified organism: Secondary | ICD-10-CM | POA: Diagnosis present

## 2020-11-25 DIAGNOSIS — Z66 Do not resuscitate: Secondary | ICD-10-CM | POA: Diagnosis present

## 2020-11-25 DIAGNOSIS — M79671 Pain in right foot: Secondary | ICD-10-CM | POA: Diagnosis present

## 2020-11-25 DIAGNOSIS — I248 Other forms of acute ischemic heart disease: Secondary | ICD-10-CM | POA: Diagnosis present

## 2020-11-25 DIAGNOSIS — N179 Acute kidney failure, unspecified: Secondary | ICD-10-CM | POA: Diagnosis present

## 2020-11-25 DIAGNOSIS — H1089 Other conjunctivitis: Secondary | ICD-10-CM | POA: Diagnosis present

## 2020-11-25 DIAGNOSIS — Z9011 Acquired absence of right breast and nipple: Secondary | ICD-10-CM

## 2020-11-25 DIAGNOSIS — M3500 Sicca syndrome, unspecified: Secondary | ICD-10-CM | POA: Diagnosis present

## 2020-11-25 DIAGNOSIS — A419 Sepsis, unspecified organism: Secondary | ICD-10-CM | POA: Diagnosis not present

## 2020-11-25 DIAGNOSIS — G9349 Other encephalopathy: Secondary | ICD-10-CM | POA: Diagnosis not present

## 2020-11-25 DIAGNOSIS — I2510 Atherosclerotic heart disease of native coronary artery without angina pectoris: Secondary | ICD-10-CM | POA: Diagnosis present

## 2020-11-25 DIAGNOSIS — L89152 Pressure ulcer of sacral region, stage 2: Secondary | ICD-10-CM | POA: Diagnosis present

## 2020-11-25 DIAGNOSIS — E43 Unspecified severe protein-calorie malnutrition: Secondary | ICD-10-CM | POA: Diagnosis present

## 2020-11-25 DIAGNOSIS — R778 Other specified abnormalities of plasma proteins: Secondary | ICD-10-CM

## 2020-11-25 DIAGNOSIS — E1122 Type 2 diabetes mellitus with diabetic chronic kidney disease: Secondary | ICD-10-CM | POA: Diagnosis present

## 2020-11-25 DIAGNOSIS — Z6820 Body mass index (BMI) 20.0-20.9, adult: Secondary | ICD-10-CM

## 2020-11-25 DIAGNOSIS — B370 Candidal stomatitis: Secondary | ICD-10-CM | POA: Diagnosis present

## 2020-11-25 DIAGNOSIS — E1165 Type 2 diabetes mellitus with hyperglycemia: Secondary | ICD-10-CM | POA: Diagnosis present

## 2020-11-25 DIAGNOSIS — G9341 Metabolic encephalopathy: Secondary | ICD-10-CM | POA: Diagnosis present

## 2020-11-25 DIAGNOSIS — Z9981 Dependence on supplemental oxygen: Secondary | ICD-10-CM

## 2020-11-25 DIAGNOSIS — R300 Dysuria: Secondary | ICD-10-CM | POA: Diagnosis present

## 2020-11-25 DIAGNOSIS — L97419 Non-pressure chronic ulcer of right heel and midfoot with unspecified severity: Secondary | ICD-10-CM | POA: Diagnosis present

## 2020-11-25 DIAGNOSIS — E860 Dehydration: Secondary | ICD-10-CM | POA: Diagnosis present

## 2020-11-25 DIAGNOSIS — L8990 Pressure ulcer of unspecified site, unspecified stage: Secondary | ICD-10-CM | POA: Insufficient documentation

## 2020-11-25 DIAGNOSIS — E876 Hypokalemia: Secondary | ICD-10-CM | POA: Diagnosis present

## 2020-11-25 DIAGNOSIS — Z7984 Long term (current) use of oral hypoglycemic drugs: Secondary | ICD-10-CM

## 2020-11-25 LAB — CBC WITH DIFFERENTIAL/PLATELET
Abs Immature Granulocytes: 0.04 10*3/uL (ref 0.00–0.07)
Basophils Absolute: 0 10*3/uL (ref 0.0–0.1)
Basophils Relative: 0 %
Eosinophils Absolute: 0 10*3/uL (ref 0.0–0.5)
Eosinophils Relative: 0 %
HCT: 28.2 % — ABNORMAL LOW (ref 36.0–46.0)
Hemoglobin: 9.2 g/dL — ABNORMAL LOW (ref 12.0–15.0)
Immature Granulocytes: 0 %
Lymphocytes Relative: 5 %
Lymphs Abs: 0.5 10*3/uL — ABNORMAL LOW (ref 0.7–4.0)
MCH: 32.4 pg (ref 26.0–34.0)
MCHC: 32.6 g/dL (ref 30.0–36.0)
MCV: 99.3 fL (ref 80.0–100.0)
Monocytes Absolute: 0.6 10*3/uL (ref 0.1–1.0)
Monocytes Relative: 6 %
Neutro Abs: 9.2 10*3/uL — ABNORMAL HIGH (ref 1.7–7.7)
Neutrophils Relative %: 89 %
Platelets: 189 10*3/uL (ref 150–400)
RBC: 2.84 MIL/uL — ABNORMAL LOW (ref 3.87–5.11)
RDW: 13.1 % (ref 11.5–15.5)
WBC: 10.4 10*3/uL (ref 4.0–10.5)
nRBC: 0 % (ref 0.0–0.2)

## 2020-11-25 LAB — COMPREHENSIVE METABOLIC PANEL
ALT: 22 U/L (ref 0–44)
AST: 22 U/L (ref 15–41)
Albumin: 2.1 g/dL — ABNORMAL LOW (ref 3.5–5.0)
Alkaline Phosphatase: 88 U/L (ref 38–126)
Anion gap: 11 (ref 5–15)
BUN: 41 mg/dL — ABNORMAL HIGH (ref 8–23)
CO2: 24 mmol/L (ref 22–32)
Calcium: 8.7 mg/dL — ABNORMAL LOW (ref 8.9–10.3)
Chloride: 100 mmol/L (ref 98–111)
Creatinine, Ser: 1.66 mg/dL — ABNORMAL HIGH (ref 0.44–1.00)
GFR, Estimated: 31 mL/min — ABNORMAL LOW (ref >60)
Glucose, Bld: 191 mg/dL — ABNORMAL HIGH (ref 70–99)
Potassium: 3.2 mmol/L — ABNORMAL LOW (ref 3.5–5.1)
Sodium: 135 mmol/L (ref 135–145)
Total Bilirubin: 0.7 mg/dL (ref 0.3–1.2)
Total Protein: 5.3 g/dL — ABNORMAL LOW (ref 6.5–8.1)

## 2020-11-25 LAB — LACTIC ACID, PLASMA: Lactic Acid, Venous: 5.3 mmol/L (ref 0.5–1.9)

## 2020-11-25 LAB — TROPONIN I (HIGH SENSITIVITY): Troponin I (High Sensitivity): 44 ng/L — ABNORMAL HIGH (ref <18)

## 2020-11-25 MED ORDER — SODIUM CHLORIDE 0.9 % IV SOLN
1.0000 g | Freq: Once | INTRAVENOUS | Status: AC
  Administered 2020-11-25: 1 g via INTRAVENOUS
  Filled 2020-11-25: qty 10

## 2020-11-25 MED ORDER — SODIUM CHLORIDE 0.9 % IV SOLN
500.0000 mg | Freq: Once | INTRAVENOUS | Status: AC
  Administered 2020-11-25: 500 mg via INTRAVENOUS
  Filled 2020-11-25: qty 500

## 2020-11-25 MED ORDER — LACTATED RINGERS IV BOLUS
1000.0000 mL | Freq: Once | INTRAVENOUS | Status: AC
  Administered 2020-11-25: 1000 mL via INTRAVENOUS

## 2020-11-25 NOTE — ED Provider Notes (Signed)
[facility] DEPT Provider Note   CSN: [id number] Arrival date & time: 11/25/20  2125     History Chief Complaint  Patient presents with   Blood Infection    Terry Alexander is a 80 y.o. female who presents with concern for altered mental status per staff at her facility.  Patient is coming from [facility] by EMS.  Also report of hypoglycemia at her facility today.  EMS report that patient is on oxygen all the time, however she states that she does not typically wear oxygen and just required it today.  She states she is not sure why she was brought to the emergency department today as she is feeling normal for herself.  Does have some pain in her right heel but states that this is not new given her right heel wound.  Patient is not able to ambulate at baseline.  I have personally reviewed this patient's medical records.  She is history of diabetes, breast cancer status post right mastectomy, Sjogren's syndrome, gastric cancer in the past.  She is not on any anticoagulation.  HPI     Past Medical History:  Diagnosis Date   Breast cancer ([place])    Right side   Diabetes ([place])    High cholesterol    Sjogren's syndrome ([employer] [place])    Stomach cancer [facility])     Patient Active Problem List   Diagnosis Date Noted   CAP (community acquired pneumonia) 11/26/2020   Anemia 11/19/2020   Protein-calorie malnutrition, severe 10/26/2020   NSTEMI (non-ST elevated myocardial infarction) ([place]) 10/24/2020   Pressure injury of right heel, stage 2 ([place]) 10/24/2020   Generalized weakness 10/23/2020   High risk medication use 02/13/2020   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     Family History  Problem Relation Age of Onset   Heart attack Mother        Died 88   Prostate cancer Father    Heart attack Father        Died age 69   Heart attack Son 71    Social History   Tobacco Use   Smoking status: Former    Packs/day: 1.00    Years: 20.00    Pack years: 20.00    Types: Cigarettes    Quit date: 01/14/1991    Years since quitting: 29.8   Smokeless tobacco: Never  Vaping Use   Vaping Use: Never used  Substance Use Topics   Alcohol use: Yes    Comment: Wine every once in a while   Drug use: No    Home Medications Prior to Admission medications   Medication Sig Start Date End Date Taking? Authorizing Provider  acetaminophen (TYLENOL) 650 MG CR tablet Take 1,300 mg by mouth in the morning and at bedtime.   Yes [provider]  Ascorbic Acid (VITAMIN C) 1000 MG tablet Take 1,000 mg by mouth daily.   Yes [provider]  aspirin 81 MG tablet Take 81 mg by mouth daily.   Yes [provider]  atorvastatin (LIPITOR) 10 MG tablet Take 10 mg by mouth at bedtime.   Yes [provider]  cefTRIAXone (ROCEPHIN) 1 g injection Inject 1 g into the muscle once.   Yes [provider]  Cholecalciferol 25 MCG (1000 UT) tablet Take 1,000 Units by mouth daily.   Yes [provider]  cycloSPORINE (RESTASIS) 0.05 % ophthalmic emulsion Place 1 drop into both eyes 2 (two) times daily.   Yes [provider]  dorzolamide (TRUSOPT) 2 % ophthalmic solution Place 1 drop into both eyes 2 (two) times daily.   Yes [provider]  doxycycline (VIBRA-TABS) 100 MG tablet Take 100 mg by mouth 2 (two) times daily.   Yes [provider]  erythromycin ophthalmic ointment Place 1 application  into the left eye 3 (three) times daily.   Yes [provider]  folic acid (FOLVITE) 1 MG tablet Take 2 tablets (2 mg total) by mouth daily. 06/25/20  Yes Rice, Resa Miner, MD  gabapentin (NEURONTIN) 300 MG capsule Take 300 mg by mouth at bedtime.   Yes [provider]  guaiFENesin (MUCINEX) 600 MG 12 hr tablet Take 600 mg by mouth See admin instructions. Give 600mg  by mouth twice a day for 14 days   Yes [provider]  insulin aspart (NOVOLOG FLEXPEN) 100 UNIT/ML FlexPen Inject 10 Units into the skin See admin instructions. Inject 10 units into the skin if blood glucose is greater than 400   Yes [provider]  ipratropium-albuterol (DUONEB) 0.5-2.5 (3) MG/3ML SOLN Take 3 mLs by nebulization every 2 (two) hours as needed (for cough, wheezing, shortness of breath).   Yes [provider]  lidocaine (LIDODERM) 5 % Place 1 patch onto the skin daily. Apple once daily to leg at night for leg pain   Yes [provider]  linagliptin (TRADJENTA) 5 MG TABS tablet Take 5 mg by mouth daily.   Yes [provider]  losartan (COZAAR) 50 MG tablet Take 50 mg by mouth daily.   Yes [provider]  Magnesium 400 MG TABS Take 400 mg by mouth daily.   Yes [provider]  melatonin 1 MG TABS tablet Take 1 mg by mouth at bedtime.   Yes [provider]  mirtazapine (REMERON) 15 MG tablet Take 15 mg by mouth at bedtime.   Yes [provider]  moxifloxacin (VIGAMOX) 0.5 % ophthalmic solution Place 1 drop into both eyes daily.   Yes [provider]  Nitroglycerin (RECTIV) 0.4 % OINT Place 1 application rectally in the morning and at bedtime.   Yes [provider]  Nutritional Supplements (GLUCERNA 1.5 CAL PO) Take 8 oz by mouth in the morning and at bedtime.   Yes [provider]  pantoprazole (PROTONIX) 40 MG tablet Take 40 mg by mouth daily.   Yes [provider]  Polyethyl  Glycol-Propyl Glycol (SYSTANE) 0.4-0.3 % GEL ophthalmic gel Place 1 application into both eyes at bedtime.   Yes [provider]  prednisoLONE acetate (PRED FORTE) 1 % ophthalmic suspension Place 1 drop into the right eye daily in the afternoon.   Yes [provider]  predniSONE (DELTASONE) 20 MG tablet Take 40 mg by mouth daily with breakfast.   Yes [provider]  pyridostigmine (MESTINON) 60 MG tablet Take 1 tablet (60 mg total) by mouth 3 (three) times daily. 04/24/14  Yes  Melvenia Beam, MD  timolol (BETIMOL) 0.5 % ophthalmic solution Place 1 drop into both eyes 2 (two) times daily.   Yes [provider]  torsemide (DEMADEX) 10 MG tablet Take 10 mg by mouth daily.   Yes [provider]  Emollient (LUBRIDERM SERIOUSLY SENSITIVE) LOTN Apply 1 application topically 4 (four) times daily as needed (dry skin). Patient not taking: Reported on 11/25/2020    [provider]  fexofenadine (ALLEGRA) 180 MG tablet Take 180 mg by mouth daily as needed for allergies. Patient not taking: Reported on 11/25/2020    [provider]  Insulin Pen Needle (NOVOFINE) 30G X 8 MM MISC Inject 1 packet into the skin as needed.    [provider]  Ophthalmic Irrigation Solution (OCUSOFT EYE [place] OP) Apply 1 application to eye 4 (four) times daily as needed (eye exudate). Patient not taking: Reported on 11/25/2020    [provider]  Polyethyl Glycol-Propyl Glycol (SYSTANE OP) Apply 1 drop to eye daily as needed (dry eyes). Patient not taking: Reported on 11/25/2020    [provider]  polyethylene glycol (MIRALAX / GLYCOLAX) 17 g packet Take 17 g by mouth daily as needed for mild constipation. Patient not taking: Reported on 11/25/2020    [provider]  senna (SENOKOT) 8.6 MG TABS tablet Take 2 tablets by mouth at bedtime as needed for mild constipation. Patient not taking: Reported on 11/25/2020    [provider]  simethicone (MYLICON) 80 MG chewable tablet Chew 80 mg by mouth 4 (four) times daily as needed for flatulence. Patient not taking: Reported on 11/25/2020    [provider]    Allergies    Methotrexate derivatives and Codeine  Review of Systems   Review of Systems  Constitutional: Negative.   HENT: Negative.    Respiratory:  Positive for shortness of breath.   Cardiovascular: Negative.   Gastrointestinal: Negative.   Genitourinary: Negative.   Musculoskeletal: Negative.   Skin:  Positive for wound.  Neurological:  Positive for weakness.  Psychiatric/Behavioral:  Positive for confusion.    Physical Exam Updated Vital Signs BP (!) 116/55   Pulse 74   Temp 97.7 F (36.5 C) (Rectal)   Resp 14   Ht 5\' 3"  (1.6 m)   Wt 53.1 kg   SpO2 99%   BMI 20.73 kg/m   Physical Exam Vitals and nursing note reviewed.  Constitutional:      General: She is sleeping.     Appearance: She is ill-appearing. She is not toxic-appearing.  HENT:     Head: Normocephalic and atraumatic.     Nose: Nose normal. No congestion.     Mouth/Throat:     Mouth: Mucous membranes are dry.     Pharynx: Oropharynx is clear. Uvula midline. No oropharyngeal exudate or posterior oropharyngeal erythema.     Tonsils: No tonsillar exudate.  Eyes:     General: Lids are normal. Vision grossly intact.        Right eye: No discharge.        Left eye: No discharge.     Extraocular Movements: Extraocular movements intact.     Conjunctiva/sclera: Conjunctivae normal.     Pupils: Pupils are equal, round, and reactive to light.  Neck:     Trachea: Trachea and phonation normal.  Cardiovascular:     Rate and Rhythm: Normal rate and regular rhythm.     Pulses: Normal pulses.     Heart sounds: Normal heart sounds. No  murmur heard. Pulmonary:     Effort: Pulmonary effort is normal. Tachypnea present. No accessory muscle usage or respiratory distress.     Breath sounds: Examination of the  right-middle field reveals decreased breath sounds. Examination of the left-middle field reveals rales. Examination of the right-lower field reveals decreased breath sounds and rales. Examination of the left-lower field reveals decreased breath sounds and rales. Decreased breath sounds and rales present. No wheezing.  Chest:     Chest wall: No mass, lacerations, deformity, swelling, tenderness or crepitus.    Abdominal:     General: Bowel sounds are normal. There is no distension.     Palpations: Abdomen is soft.     Tenderness: There is no abdominal tenderness.  Musculoskeletal:        General: No deformity.     Cervical back: Normal range of motion and neck supple. No edema, rigidity, tenderness or crepitus. No pain with movement, spinous process tenderness or muscular tenderness.     Right lower leg: 1+ Edema present.     Left lower leg: 1+ Pitting Edema present.       Feet:  Lymphadenopathy:     Cervical: No cervical adenopathy.  Skin:    General: Skin is warm and dry.     Capillary Refill: Capillary refill takes less than 2 seconds.     Findings: Wound present.  Neurological:     General: No focal deficit present.     Mental Status: She is oriented to person, place, and time and easily aroused.     GCS: GCS eye subscore is 3. GCS verbal subscore is 5. GCS motor subscore is 6.  Psychiatric:        Mood and Affect: Mood normal.    ED Results / Procedures / Treatments   Labs (all labs ordered are listed, but only abnormal results are displayed) Labs Reviewed  LACTIC ACID, PLASMA - Abnormal; Notable for the following components:      Result Value   Lactic Acid, Venous 5.3 (*)    All other components within normal limits  COMPREHENSIVE METABOLIC PANEL - Abnormal; Notable for the following components:   Potassium 3.2 (*)    Glucose, Bld 191 (*)    BUN 41 (*)    Creatinine, Ser 1.66 (*)    Calcium 8.7 (*)    Total Protein 5.3 (*)    Albumin 2.1 (*)    GFR, Estimated 31 (*)     All other components within normal limits  CBC WITH DIFFERENTIAL/PLATELET - Abnormal; Notable for the following components:   RBC 2.84 (*)    Hemoglobin 9.2 (*)    HCT 28.2 (*)    Neutro Abs 9.2 (*)    Lymphs Abs 0.5 (*)    All other components within normal limits  TROPONIN I (HIGH SENSITIVITY) - Abnormal; Notable for the following components:   Troponin I (High Sensitivity) 44 (*)    All other components within normal limits  RESP PANEL BY RT-PCR (FLU A&B, COVID) ARPGX2  LACTIC ACID, PLASMA  URINALYSIS, ROUTINE W REFLEX MICROSCOPIC  BRAIN NATRIURETIC PEPTIDE    EKG EKG Interpretation  Date/Time:  Sunday November 25 2020 21:53:29 EST Ventricular Rate:  74 PR Interval:  157 QRS Duration: 86 QT Interval:  367 QTC Calculation: 408 R Axis:   46 Text Interpretation: Sinus rhythm Low voltage, extremity and precordial leads Abnormal R-wave progression, early transition Abnrm T, consider ischemia, anterolateral lds Confirmed by Molpus, John [id number]) on 11/25/2020 11:31:36 PM  Radiology DG Chest 2 View  Result Date: 11/25/2020 CLINICAL DATA:  Infection. EXAM: CHEST - 2 VIEW COMPARISON:  02/03/2020. FINDINGS: The heart is borderline enlarged the pulmonary vasculature is distended. Mild bronchiectasis is noted bilaterally. There is patchy airspace disease in the perihilar region on the left and consolidation at the left lung base. Small left pleural effusion is noted. No pneumothorax is seen. No acute osseous abnormality. IMPRESSION: 1. Cardiomegaly with mildly distended pulmonary vasculature. 2. Patchy airspace disease in the left lung with consolidation at the left lung base. 3. Small left pleural effusion. Electronically Signed   By: Brett Fairy M.D.   On: 11/25/2020 22:32   DG Foot Complete Right  Result Date: 11/25/2020 CLINICAL DATA:  Wound to right heel.  Diabetic patient. EXAM: RIGHT FOOT COMPLETE - 3+ VIEW COMPARISON:  None. FINDINGS: Bony under mineralization. Hammertoe  deformity of the digits. No erosion, periosteal reaction, or bone destruction. Mild midfoot degenerative change with spurring. There is a moderate plantar calcaneal spur. Vascular calcifications are seen. Mild skin irregularity overlies the heel. No soft tissue air. No radiopaque foreign body. Overlying dressing in place. IMPRESSION: 1. No radiographic evidence of osteomyelitis. 2. Skin irregularity overlies the heel may represent site of laceration. No radiopaque foreign body or soft tissue air. 3. Bony under mineralization with hammertoe deformity of the digits and mild midfoot degenerative change. Electronically Signed   By: Keith Rake M.D.   On: 11/25/2020 23:35    Procedures Procedures   Medications Ordered in ED Medications  azithromycin (ZITHROMAX) 500 mg in sodium chloride 0.9 % 250 mL IVPB (500 mg Intravenous New Bag/Given 11/25/20 2331)  cefTRIAXone (ROCEPHIN) 1 g in sodium chloride 0.9 % 100 mL IVPB (0 g Intravenous Stopped 11/25/20 2353)  lactated ringers bolus 1,000 mL (1,000 mLs Intravenous New Bag/Given 11/25/20 2332)    ED Course  I have reviewed the triage vital signs and the nursing notes.  Pertinent labs & imaging results that were available during my care of the patient were reviewed by me and considered in my medical decision making (see chart for details).  Clinical Course as of 11/26/20 0018  Mon Nov 26, 2020  0004 Consult to Dr. Marlowe Sax, hospitalist, who is agreeable to seeing this patient and admitting her to her service. I appreciate her collaboration in the care of this patient.  [RS]    Clinical Course User Index [RS] Srinivas Lippman, Sharlene Dory   MDM Rules/Calculators/A&P                         80 year old female presents with concern for altered mental status from her facility today.  The differential diagnosis for AMS is extensive and includes, but is not limited to:  Drug overdose - opioids, alcohol, sedatives, antipsychotics, drug withdrawal,  others Metabolic: hypoxia, hypoglycemia, hyperglycemia, hypercalcemia, hypernatremia, hyponatremia, uremia, hepatic encephalopathy, hypothyroidism, hyperthyroidism, vitamin B12 or thiamine deficiency, carbon monoxide poisoning, Wilson's disease, Lactic acidosis, DKA/HHOS Infectious: meningitis, encephalitis, bacteremia/sepsis, urinary tract infection, pneumonia, neurosyphilis Structural: Space-occupying lesion, (brain tumor, subdural hematoma, hydrocephalus,) Vascular: stroke, subarachnoid hemorrhage, coronary ischemia, hypertensive encephalopathy, CNS vasculitis, thrombotic thrombocytopenic purpura, disseminated intravascular coagulation, hyperviscosity Psychiatric: Schizophrenia, depression; Other: Seizure, hypothermia, heat stroke, ICU psychosis, dementia -"sundowning."  And coded as code sepsis, however patient did not meet criteria upon arrival.  Heart rate is normal in the 70s, blood pressure very mildly hypotensive at 96, vital signs otherwise normal.  Patient is on 3 L supplemental oxygen by nasal cannula with  saturations 100%.  Cardiac exam is unremarkable, pulmonary exam with rales and coarse breath sounds  throughout the left lung.  Diminished sounds in the right lung base.  1+ left lower extremity edema.  Right heel pressure ulcer with eschar.  Patient is ANO x3, follows commands.  Mucous membranes are dry.  Labs and imaging reviewed and interpreted by me. CBC anemia with hemoglobin of 9.2 since baseline.  No leukocytosis.  CMP with mild hypokalemia of 3.2, AKI with creatinine of 1.6, baseline near 1.  Lactic acid significantly elevated to 5.3.  Troponin mildly elevated to 44, however greater than 3000 1 month ago.  Patient is remained afebrile throughout her stay in the emergency department.  Chest x-ray with left lung base opacity and pleural effusion, concerning for pneumonia.  Azithromycin and Rocephin ordered, fluid bolus ordered.  Patient will require admission to the hospital for  pneumonia with new oxygen requirement.  She was trialed off oxygen by this provider with decrease in her O2 sats to the low 80s on room air.  Improved again with 3 L submental oxygen by nasal cannula.  Consult to the hospitalist as above.  Patient is admitted to her service.  Ms. Gillooly voiced understanding of her medical evaluation and treatment plan thus far.  Each of her questions was answered to her expressed satisfaction.  She is amenable to plan for admission at this time.  This chart was dictated using voice recognition software, Dragon. Despite the best efforts of this provider to proofread and correct errors, errors may still occur which can change documentation meaning.  Final Clinical Impression(s) / ED Diagnoses Final diagnoses:  None    Rx / DC Orders ED Discharge Orders     None        Emeline Darling, PA-C 11/26/20 0018    Daleen Bo, MD 11/26/20 1232

## 2020-11-25 NOTE — ED Triage Notes (Signed)
Pt arrived via EMS from [place]. Pt is more altered than baseline. Pt has a wound to her right heel which is warm to the touch. Pt has had elevated blood sugar for [place] today, in the 400s per EMS. Pt was given 10 units of insulin at [facility]. EMS reports a BG of 340. Pt meets sepsis criteria per EMS. Pt is on 3L of oxygen at all times.

## 2020-11-26 DIAGNOSIS — J189 Pneumonia, unspecified organism: Secondary | ICD-10-CM | POA: Diagnosis present

## 2020-11-26 DIAGNOSIS — I130 Hypertensive heart and chronic kidney disease with heart failure and stage 1 through stage 4 chronic kidney disease, or unspecified chronic kidney disease: Secondary | ICD-10-CM | POA: Diagnosis present

## 2020-11-26 DIAGNOSIS — Z66 Do not resuscitate: Secondary | ICD-10-CM | POA: Diagnosis present

## 2020-11-26 DIAGNOSIS — G9341 Metabolic encephalopathy: Secondary | ICD-10-CM | POA: Diagnosis present

## 2020-11-26 DIAGNOSIS — E1165 Type 2 diabetes mellitus with hyperglycemia: Secondary | ICD-10-CM | POA: Diagnosis present

## 2020-11-26 DIAGNOSIS — R778 Other specified abnormalities of plasma proteins: Secondary | ICD-10-CM | POA: Diagnosis not present

## 2020-11-26 DIAGNOSIS — I248 Other forms of acute ischemic heart disease: Secondary | ICD-10-CM | POA: Diagnosis present

## 2020-11-26 DIAGNOSIS — N179 Acute kidney failure, unspecified: Secondary | ICD-10-CM | POA: Diagnosis present

## 2020-11-26 DIAGNOSIS — L8990 Pressure ulcer of unspecified site, unspecified stage: Secondary | ICD-10-CM | POA: Insufficient documentation

## 2020-11-26 DIAGNOSIS — J690 Pneumonitis due to inhalation of food and vomit: Secondary | ICD-10-CM | POA: Diagnosis not present

## 2020-11-26 DIAGNOSIS — A419 Sepsis, unspecified organism: Secondary | ICD-10-CM

## 2020-11-26 DIAGNOSIS — B370 Candidal stomatitis: Secondary | ICD-10-CM | POA: Diagnosis present

## 2020-11-26 DIAGNOSIS — Z20822 Contact with and (suspected) exposure to covid-19: Secondary | ICD-10-CM | POA: Diagnosis present

## 2020-11-26 DIAGNOSIS — I5042 Chronic combined systolic (congestive) and diastolic (congestive) heart failure: Secondary | ICD-10-CM | POA: Diagnosis present

## 2020-11-26 DIAGNOSIS — E1122 Type 2 diabetes mellitus with diabetic chronic kidney disease: Secondary | ICD-10-CM | POA: Diagnosis present

## 2020-11-26 DIAGNOSIS — J9611 Chronic respiratory failure with hypoxia: Secondary | ICD-10-CM | POA: Diagnosis present

## 2020-11-26 DIAGNOSIS — I959 Hypotension, unspecified: Secondary | ICD-10-CM | POA: Diagnosis present

## 2020-11-26 DIAGNOSIS — E11621 Type 2 diabetes mellitus with foot ulcer: Secondary | ICD-10-CM | POA: Diagnosis present

## 2020-11-26 DIAGNOSIS — D649 Anemia, unspecified: Secondary | ICD-10-CM | POA: Diagnosis present

## 2020-11-26 DIAGNOSIS — M79671 Pain in right foot: Secondary | ICD-10-CM | POA: Diagnosis present

## 2020-11-26 DIAGNOSIS — E7800 Pure hypercholesterolemia, unspecified: Secondary | ICD-10-CM | POA: Diagnosis present

## 2020-11-26 DIAGNOSIS — E43 Unspecified severe protein-calorie malnutrition: Secondary | ICD-10-CM | POA: Diagnosis present

## 2020-11-26 DIAGNOSIS — I2510 Atherosclerotic heart disease of native coronary artery without angina pectoris: Secondary | ICD-10-CM | POA: Diagnosis present

## 2020-11-26 DIAGNOSIS — L97419 Non-pressure chronic ulcer of right heel and midfoot with unspecified severity: Secondary | ICD-10-CM | POA: Diagnosis present

## 2020-11-26 DIAGNOSIS — M3500 Sicca syndrome, unspecified: Secondary | ICD-10-CM | POA: Diagnosis present

## 2020-11-26 DIAGNOSIS — G9349 Other encephalopathy: Secondary | ICD-10-CM | POA: Diagnosis present

## 2020-11-26 DIAGNOSIS — N1831 Chronic kidney disease, stage 3a: Secondary | ICD-10-CM | POA: Diagnosis present

## 2020-11-26 DIAGNOSIS — E876 Hypokalemia: Secondary | ICD-10-CM | POA: Diagnosis present

## 2020-11-26 LAB — URINALYSIS, ROUTINE W REFLEX MICROSCOPIC
Bilirubin Urine: NEGATIVE
Glucose, UA: NEGATIVE mg/dL
Hgb urine dipstick: NEGATIVE
Ketones, ur: NEGATIVE mg/dL
Nitrite: NEGATIVE
Protein, ur: NEGATIVE mg/dL
Specific Gravity, Urine: 1.019 (ref 1.005–1.030)
pH: 5 (ref 5.0–8.0)

## 2020-11-26 LAB — MRSA NEXT GEN BY PCR, NASAL: MRSA by PCR Next Gen: NOT DETECTED

## 2020-11-26 LAB — LACTIC ACID, PLASMA
Lactic Acid, Venous: 5.1 mmol/L (ref 0.5–1.9)
Lactic Acid, Venous: 4.6 mmol/L (ref 0.5–1.9)

## 2020-11-26 LAB — CBC
HCT: 30 % — ABNORMAL LOW (ref 36.0–46.0)
Hemoglobin: 9.9 g/dL — ABNORMAL LOW (ref 12.0–15.0)
MCH: 32.6 pg (ref 26.0–34.0)
MCHC: 33 g/dL (ref 30.0–36.0)
MCV: 98.7 fL (ref 80.0–100.0)
Platelets: 191 10*3/uL (ref 150–400)
RBC: 3.04 MIL/uL — ABNORMAL LOW (ref 3.87–5.11)
RDW: 13.2 % (ref 11.5–15.5)
WBC: 11.9 10*3/uL — ABNORMAL HIGH (ref 4.0–10.5)
nRBC: 0 % (ref 0.0–0.2)

## 2020-11-26 LAB — BASIC METABOLIC PANEL
Anion gap: 11 (ref 5–15)
BUN: 27 mg/dL — ABNORMAL HIGH (ref 8–23)
CO2: 24 mmol/L (ref 22–32)
Calcium: 8.5 mg/dL — ABNORMAL LOW (ref 8.9–10.3)
Chloride: 101 mmol/L (ref 98–111)
Creatinine, Ser: 1.49 mg/dL — ABNORMAL HIGH (ref 0.44–1.00)
GFR, Estimated: 35 mL/min — ABNORMAL LOW (ref >60)
Glucose, Bld: 115 mg/dL — ABNORMAL HIGH (ref 70–99)
Potassium: 4 mmol/L (ref 3.5–5.1)
Sodium: 136 mmol/L (ref 135–145)

## 2020-11-26 LAB — GLUCOSE, CAPILLARY
Glucose-Capillary: 142 mg/dL — ABNORMAL HIGH (ref 70–99)
Glucose-Capillary: 80 mg/dL (ref 70–99)
Glucose-Capillary: 82 mg/dL (ref 70–99)
Glucose-Capillary: 182 mg/dL — ABNORMAL HIGH (ref 70–99)
Glucose-Capillary: 228 mg/dL — ABNORMAL HIGH (ref 70–99)

## 2020-11-26 LAB — RESP PANEL BY RT-PCR (FLU A&B, COVID) ARPGX2
Influenza A by PCR: NEGATIVE
Influenza B by PCR: NEGATIVE
SARS Coronavirus 2 by RT PCR: NEGATIVE

## 2020-11-26 LAB — BRAIN NATRIURETIC PEPTIDE: B Natriuretic Peptide: 267 pg/mL — ABNORMAL HIGH (ref 0.0–100.0)

## 2020-11-26 LAB — TROPONIN I (HIGH SENSITIVITY): Troponin I (High Sensitivity): 45 ng/L — ABNORMAL HIGH (ref <18)

## 2020-11-26 LAB — MAGNESIUM: Magnesium: 1.7 mg/dL (ref 1.7–2.4)

## 2020-11-26 MED ORDER — GABAPENTIN 300 MG PO CAPS
300.0000 mg | ORAL_CAPSULE | Freq: Every day | ORAL | Status: DC
  Administered 2020-11-26 – 2020-11-29 (×5): 300 mg via ORAL
  Filled 2020-11-26 (×5): qty 1

## 2020-11-26 MED ORDER — HEPARIN SODIUM (PORCINE) 5000 UNIT/ML IJ SOLN
5000.0000 [IU] | Freq: Three times a day (TID) | INTRAMUSCULAR | Status: DC
  Administered 2020-11-26 – 2020-11-30 (×13): 5000 [IU] via SUBCUTANEOUS
  Filled 2020-11-26 (×13): qty 1

## 2020-11-26 MED ORDER — ACETAMINOPHEN 650 MG RE SUPP: 650.0000 mg | Freq: Four times a day (QID) | RECTAL | Status: DC | PRN

## 2020-11-26 MED ORDER — SODIUM CHLORIDE 0.9 % IV BOLUS
500.0000 mL | Freq: Once | INTRAVENOUS | Status: AC
  Administered 2020-11-26: 500 mL via INTRAVENOUS

## 2020-11-26 MED ORDER — PREDNISOLONE ACETATE 1 % OP SUSP
1.0000 [drp] | Freq: Every day | OPHTHALMIC | Status: DC
  Administered 2020-11-26 – 2020-11-30 (×4): 1 [drp] via OPHTHALMIC
  Filled 2020-11-26: qty 5

## 2020-11-26 MED ORDER — ARTIFICIAL TEARS OPHTHALMIC OINT
TOPICAL_OINTMENT | Freq: Every day | OPHTHALMIC | Status: DC
  Filled 2020-11-26: qty 3.5

## 2020-11-26 MED ORDER — MIRTAZAPINE 7.5 MG PO TABS
15.0000 mg | ORAL_TABLET | Freq: Every day | ORAL | Status: DC
  Filled 2020-11-26: qty 2

## 2020-11-26 MED ORDER — PANTOPRAZOLE SODIUM 40 MG PO TBEC
40.0000 mg | DELAYED_RELEASE_TABLET | Freq: Every day | ORAL | Status: DC
  Administered 2020-11-26 – 2020-11-30 (×5): 40 mg via ORAL
  Filled 2020-11-26 (×5): qty 1

## 2020-11-26 MED ORDER — PYRIDOSTIGMINE BROMIDE 60 MG PO TABS
60.0000 mg | ORAL_TABLET | Freq: Three times a day (TID) | ORAL | Status: DC
  Administered 2020-11-26 – 2020-11-30 (×14): 60 mg via ORAL
  Filled 2020-11-26 (×16): qty 1

## 2020-11-26 MED ORDER — GUAIFENESIN ER 600 MG PO TB12
600.0000 mg | ORAL_TABLET | Freq: Two times a day (BID) | ORAL | Status: DC
  Administered 2020-11-26 – 2020-11-30 (×10): 600 mg via ORAL
  Filled 2020-11-26 (×10): qty 1

## 2020-11-26 MED ORDER — SODIUM CHLORIDE 0.9 % IV SOLN: 500.0000 mg | INTRAVENOUS | Status: DC

## 2020-11-26 MED ORDER — POLYETHYL GLYCOL-PROPYL GLYCOL 0.4-0.3 % OP GEL: 1.0000 "application " | Freq: Every day | OPHTHALMIC | Status: DC

## 2020-11-26 MED ORDER — ENSURE ENLIVE PO LIQD
237.0000 mL | Freq: Two times a day (BID) | ORAL | Status: DC
  Administered 2020-11-26: 237 mL via ORAL

## 2020-11-26 MED ORDER — SODIUM CHLORIDE 0.9 % IV SOLN
1.0000 g | INTRAVENOUS | Status: DC
  Administered 2020-11-26 – 2020-11-28 (×3): 1 g via INTRAVENOUS
  Filled 2020-11-26 (×3): qty 10

## 2020-11-26 MED ORDER — ERYTHROMYCIN 5 MG/GM OP OINT
1.0000 "application " | TOPICAL_OINTMENT | Freq: Three times a day (TID) | OPHTHALMIC | Status: DC
  Administered 2020-11-26 – 2020-11-30 (×13): 1 via OPHTHALMIC
  Filled 2020-11-26: qty 3.5
  Filled 2020-11-26: qty 1

## 2020-11-26 MED ORDER — ASPIRIN EC 81 MG PO TBEC
81.0000 mg | DELAYED_RELEASE_TABLET | Freq: Every day | ORAL | Status: DC
  Administered 2020-11-26 – 2020-11-30 (×5): 81 mg via ORAL
  Filled 2020-11-26 (×5): qty 1

## 2020-11-26 MED ORDER — INSULIN ASPART 100 UNIT/ML IJ SOLN
0.0000 [IU] | Freq: Three times a day (TID) | INTRAMUSCULAR | Status: DC
  Administered 2020-11-26 – 2020-11-27 (×2): 2 [IU] via SUBCUTANEOUS
  Administered 2020-11-27: 1 [IU] via SUBCUTANEOUS
  Administered 2020-11-28 (×2): 3 [IU] via SUBCUTANEOUS
  Administered 2020-11-29: 18:00:00 2 [IU] via SUBCUTANEOUS
  Administered 2020-11-29: 09:00:00 3 [IU] via SUBCUTANEOUS
  Administered 2020-11-29 – 2020-11-30 (×3): 2 [IU] via SUBCUTANEOUS
  Administered 2020-11-30: 1 [IU] via SUBCUTANEOUS

## 2020-11-26 MED ORDER — DORZOLAMIDE HCL 2 % OP SOLN
1.0000 [drp] | Freq: Two times a day (BID) | OPHTHALMIC | Status: DC
  Administered 2020-11-26 – 2020-11-30 (×10): 1 [drp] via OPHTHALMIC
  Filled 2020-11-26: qty 10

## 2020-11-26 MED ORDER — TIMOLOL MALEATE 0.5 % OP SOLN
1.0000 [drp] | Freq: Two times a day (BID) | OPHTHALMIC | Status: DC
  Administered 2020-11-26 – 2020-11-30 (×10): 1 [drp] via OPHTHALMIC
  Filled 2020-11-26: qty 5

## 2020-11-26 MED ORDER — SODIUM CHLORIDE 0.9 % IV BOLUS: 500.0000 mL | Freq: Once | INTRAVENOUS | Status: DC

## 2020-11-26 MED ORDER — CYCLOSPORINE 0.05 % OP EMUL
1.0000 [drp] | Freq: Two times a day (BID) | OPHTHALMIC | Status: DC
  Administered 2020-11-26 – 2020-11-30 (×10): 1 [drp] via OPHTHALMIC
  Filled 2020-11-26 (×11): qty 30

## 2020-11-26 MED ORDER — ACETAMINOPHEN 325 MG PO TABS: 650.0000 mg | ORAL_TABLET | Freq: Four times a day (QID) | ORAL | Status: DC | PRN

## 2020-11-26 MED ORDER — PREDNISONE 20 MG PO TABS
40.0000 mg | ORAL_TABLET | Freq: Every day | ORAL | Status: DC
  Administered 2020-11-26 – 2020-11-27 (×2): 40 mg via ORAL
  Filled 2020-11-26 (×3): qty 2

## 2020-11-26 MED ORDER — SODIUM CHLORIDE 0.9 % IV SOLN: INTRAVENOUS | Status: AC

## 2020-11-26 MED ORDER — GATIFLOXACIN 0.5 % OP SOLN
1.0000 [drp] | Freq: Every day | OPHTHALMIC | Status: DC
  Administered 2020-11-26 – 2020-11-30 (×5): 1 [drp] via OPHTHALMIC
  Filled 2020-11-26: qty 2.5

## 2020-11-26 MED ORDER — SODIUM CHLORIDE 0.9 % IV SOLN: INTRAVENOUS | Status: DC

## 2020-11-26 MED ORDER — AZITHROMYCIN 250 MG PO TABS
500.0000 mg | ORAL_TABLET | Freq: Every day | ORAL | Status: AC
  Administered 2020-11-26 – 2020-11-29 (×4): 500 mg via ORAL
  Filled 2020-11-26 (×4): qty 2

## 2020-11-26 MED ORDER — POTASSIUM CHLORIDE CRYS ER 20 MEQ PO TBCR
40.0000 meq | EXTENDED_RELEASE_TABLET | Freq: Once | ORAL | Status: AC
  Administered 2020-11-26: 40 meq via ORAL
  Filled 2020-11-26: qty 2

## 2020-11-26 MED ORDER — INSULIN ASPART 100 UNIT/ML IJ SOLN
0.0000 [IU] | Freq: Every day | INTRAMUSCULAR | Status: DC
  Administered 2020-11-26: 2 [IU] via SUBCUTANEOUS
  Administered 2020-11-27: 3 [IU] via SUBCUTANEOUS
  Administered 2020-11-28: 4 [IU] via SUBCUTANEOUS
  Administered 2020-11-29: 3 [IU] via SUBCUTANEOUS

## 2020-11-26 MED ORDER — MIRTAZAPINE 15 MG PO TABS
15.0000 mg | ORAL_TABLET | Freq: Every day | ORAL | Status: DC
  Administered 2020-11-26 – 2020-11-29 (×5): 15 mg via ORAL
  Filled 2020-11-26 (×5): qty 1

## 2020-11-26 MED ORDER — TIMOLOL HEMIHYDRATE 0.5 % OP SOLN: 1.0000 [drp] | Freq: Two times a day (BID) | OPHTHALMIC | Status: DC

## 2020-11-26 MED ORDER — SODIUM CHLORIDE 0.9 % IV BOLUS: 250.0000 mL | Freq: Once | INTRAVENOUS | Status: DC

## 2020-11-26 MED ORDER — ATORVASTATIN CALCIUM 10 MG PO TABS
10.0000 mg | ORAL_TABLET | Freq: Every day | ORAL | Status: DC
  Administered 2020-11-26 – 2020-11-29 (×5): 10 mg via ORAL
  Filled 2020-11-26 (×5): qty 1

## 2020-11-26 NOTE — Progress Notes (Signed)
Patient admitted to the hospital earlier this morning by Dr. Marlowe Sax  Patient seen and examined.  She is feeling better.  She does have a productive cough.  Bilateral rhonchi on chest exam.  She admits to having coughing after eating and drinking.  Overall mental status appears to have improved.  Pneumonia.  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

## 2020-11-26 NOTE — TOC Initial Note (Signed)
Transition of Care [facility]) - Initial/Assessment Note    Patient Details  Name: Terry Alexander MRN: [id number] Date of Birth: [birth year]  [facility] CM/SW Contact:    Leeroy Cha, RN Phone Number: 11/26/2020, 9:57 AM  Clinical Narrative:                  80 y.o. 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  Her blood glucose was elevated in the 400s at her nursing home today and she was given 10 units of insulin.  CBG in the 300s with EMS.  Per EMS, she is on continuous 3 L home oxygen.  In the ED, satting well on 3 L supplemental oxygen.  Not febrile or tachycardic.  Blood pressure soft on arrival and improved after 1 L fluid bolus.  Labs showing WBC 10.4, hemoglobin 9.2 (stable), platelet count 189k.  Sodium 135, potassium 3.2, chloride 100, bicarb 24, BUN 41, creatinine 1.6 (baseline 0.9), glucose 191.  Albumin 2.1.  LFTs normal.  Lactic acid 5.3.  High-sensitivity troponin 44.  UA pending.  COVID and influenza PCR pending.  Chest x-ray showing patchy airspace disease in the perihilar region on the left and consolidation at the left lung base with small left pleural effusion.  X-ray of right foot negative for osteomyelitis. Patient was given antibiotics for pneumonia including ceftriaxone and azithromycin.   Patient states she was told she has pneumonia.  Does endorse productive cough and poor oral intake.  Denies shortness of breath, fevers, or chest pain.  Denies nausea, vomiting, abdominal pain, or diarrhea.  Reports  burning with urination since she has been in the hospital.  She is vaccinated against COVID.  She uses oxygen at her facility.  TOC PLAN OF CARE:  Following for toc needs and progression.  Patient is [facility].        Patient Goals and CMS Choice        Expected Discharge Plan and Services                                                Prior Living Arrangements/Services                       Activities of Daily Living Home Assistive Devices/Equipment: Gilford Rile (specify type) ADL Screening (condition at time of admission) Patient's cognitive ability adequate to safely complete daily activities?: Yes Is the patient deaf or have difficulty hearing?: No Does the patient have difficulty seeing, even when wearing glasses/contacts?: No Does the patient have difficulty concentrating, remembering, or making decisions?: No Patient able to express need for assistance with ADLs?: Yes Does the patient have difficulty dressing or bathing?: Yes Independently performs ADLs?: Yes (appropriate for developmental age) Does the patient have difficulty walking or climbing stairs?: Yes Weakness of Legs: Both Weakness of Arms/Hands: None  Permission Sought/Granted                  Emotional  Assessment              Admission diagnosis:  CAP (community acquired pneumonia) [J18.9] Patient Active Problem List   Diagnosis Date Noted   CAP (community acquired pneumonia) 11/26/2020   Sepsis ([place]) 11/26/2020   AKI (acute kidney injury) ([place]) 11/15/1115   Acute metabolic encephalopathy 35/67/0141   Elevated troponin 11/26/2020   Pressure injury of skin 11/26/2020   Anemia 11/19/2020   Protein-calorie malnutrition, severe 10/26/2020   NSTEMI (non-ST elevated myocardial infarction) ([place]) 10/24/2020   Pressure injury of right heel, stage 2 ([place]) 10/24/2020   Generalized weakness 10/23/2020   High risk medication use 02/13/2020   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

## 2020-11-26 NOTE — Progress Notes (Signed)
Chaplain engaged in an initial visit with Terry Alexander.  When Chaplain arrived in the room [place], with her eyes closed, asked, "What should I be doing?"  Chaplain worked to provide her comfort and stated that all she had to do was rest.  She asked that question two more times before Chaplain left.  Chaplain assured her the team would let her know if they needed anything.  Chaplain offered a prayer over [place] and offered support.    Chaplain is available to follow-up as needed.    11/26/20 0900  Clinical Encounter Type  Visited With Patient  Visit Type Initial;Spiritual support

## 2020-11-26 NOTE — H&P (Signed)
History and Physical    Terry Alexander FWY:[id number] DOB: [birth year] DOA: 11/25/2020  PCP: Janifer Adie, MD Patient coming from: Nursing home  Chief Complaint: Altered mental status  HPI: Terry Alexander is a 80 y.o. 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  Her blood glucose was elevated in the 400s at her nursing home today and she was given 10 units of insulin.  CBG in the 300s with EMS.  Per EMS, she is on continuous 3 L home oxygen.  In the ED, satting well on 3 L supplemental oxygen.  Not febrile or tachycardic.  Blood pressure soft on arrival and improved after 1 L fluid bolus.  Labs showing WBC 10.4, hemoglobin 9.2 (stable), platelet count 189k.  Sodium 135, potassium 3.2, chloride 100, bicarb 24, BUN 41, creatinine 1.6 (baseline 0.9), glucose 191.  Albumin 2.1.  LFTs normal.  Lactic acid 5.3.  High-sensitivity troponin 44.  UA pending.  COVID and influenza PCR pending.  Chest x-ray showing patchy airspace disease in the perihilar region on the left and consolidation at the left lung base with small left pleural effusion.  X-ray of right foot negative for osteomyelitis. Patient was given antibiotics for pneumonia including ceftriaxone and azithromycin.  Patient states she was told she has pneumonia.  Does endorse productive cough and poor oral intake.  Denies shortness of breath, fevers, or chest pain.  Denies nausea, vomiting, abdominal pain, or diarrhea.  Reports burning with urination since she has been in the hospital.  She is  vaccinated against COVID.  She uses oxygen at her facility.  Review of Systems:  All systems reviewed and apart from history of presenting illness, are negative.  Past Medical History:  Diagnosis Date   Breast cancer ([place])    Right side   Diabetes ([place])    High cholesterol    Sjogren's syndrome ([place])    Stomach cancer ([place])     Past Surgical History:  Procedure Laterality Date   ABDOMINAL HYSTERECTOMY     MASTECTOMY Right 1977   OTHER SURGICAL HISTORY  1977   Right breast removed    Tumor removed     Stomach     reports that she quit smoking about 29 years ago. Her smoking use included cigarettes. She has a 20.00 pack-year smoking history. She has never used smokeless tobacco. She reports current alcohol use. She reports that she does not use drugs.  Allergies  Allergen Reactions   Methotrexate Derivatives Other (See Comments)    Transaminitis, anemia, weight loss   Codeine Nausea And Vomiting    Family History  Problem Relation Age of Onset   Heart attack Mother        Died 88   Prostate cancer Father    Heart attack Father        Died age 96   Heart attack Son 52    Prior to Admission medications   Medication Sig Start Date End Date Taking? Authorizing Provider  acetaminophen (TYLENOL) 650 MG CR tablet Take 1,300 mg by mouth in the morning and at bedtime.   Yes [provider]  Ascorbic  Acid (VITAMIN C) 1000 MG tablet Take 1,000 mg by mouth daily.   Yes [provider]  aspirin 81 MG tablet Take 81 mg by mouth daily.   Yes [provider]  atorvastatin (LIPITOR) 10 MG tablet Take 10 mg by mouth at bedtime.   Yes [provider]  cefTRIAXone (ROCEPHIN) 1 g injection Inject 1 g into the muscle once.   Yes [provider]  Cholecalciferol 25 MCG (1000 UT) tablet Take 1,000 Units by mouth daily.   Yes [provider]  cycloSPORINE (RESTASIS) 0.05 % ophthalmic emulsion Place 1 drop into both eyes 2 (two) times  daily.   Yes [provider]  dorzolamide (TRUSOPT) 2 % ophthalmic solution Place 1 drop into both eyes 2 (two) times daily.   Yes [provider]  doxycycline (VIBRA-TABS) 100 MG tablet Take 100 mg by mouth 2 (two) times daily.   Yes [provider]  erythromycin ophthalmic ointment Place 1 application into the left eye 3 (three) times daily.   Yes [provider]  folic acid (FOLVITE) 1 MG tablet Take 2 tablets (2 mg total) by mouth daily. 06/25/20  Yes Rice, Resa Miner, MD  gabapentin (NEURONTIN) 300 MG capsule Take 300 mg by mouth at bedtime.   Yes [provider]  guaiFENesin (MUCINEX) 600 MG 12 hr tablet Take 600 mg by mouth See admin instructions. Give 600mg  by mouth twice a day for 14 days   Yes [provider]  insulin aspart (NOVOLOG FLEXPEN) 100 UNIT/ML FlexPen Inject 10 Units into the skin See admin instructions. Inject 10 units into the skin if blood glucose is greater than 400   Yes [provider]  ipratropium-albuterol (DUONEB) 0.5-2.5 (3) MG/3ML SOLN Take 3 mLs by nebulization every 2 (two) hours as needed (for cough, wheezing, shortness of breath).   Yes [provider]  lidocaine (LIDODERM) 5 % Place 1 patch onto the skin daily. Apple once daily to leg at night for leg pain   Yes [provider]  linagliptin (TRADJENTA) 5 MG TABS tablet Take 5 mg by mouth daily.   Yes [provider]  losartan (COZAAR) 50 MG tablet Take 50 mg by mouth daily.   Yes [provider]  Magnesium 400 MG TABS Take 400 mg by mouth daily.   Yes [provider]  melatonin 1 MG TABS tablet Take 1 mg by mouth at bedtime.   Yes [provider]  mirtazapine (REMERON) 15 MG tablet Take 15 mg by mouth at bedtime.   Yes [provider]  moxifloxacin (VIGAMOX) 0.5 % ophthalmic solution Place 1 drop into both eyes daily.   Yes [provider]  Nitroglycerin (RECTIV) 0.4 % OINT Place  1 application rectally in the morning and at bedtime.   Yes [provider]  Nutritional Supplements (GLUCERNA 1.5 CAL PO) Take 8 oz by mouth in the morning and at bedtime.   Yes [provider]  pantoprazole (PROTONIX) 40 MG tablet Take 40 mg by mouth daily.   Yes [provider]  Polyethyl Glycol-Propyl Glycol (SYSTANE) 0.4-0.3 % GEL ophthalmic gel Place 1 application into both eyes at bedtime.   Yes [provider]  prednisoLONE acetate (PRED FORTE) 1 % ophthalmic suspension Place 1 drop into the right eye daily in the afternoon.   Yes [provider]  predniSONE (DELTASONE) 20 MG tablet Take 40 mg by mouth daily with breakfast.   Yes [provider]  pyridostigmine (MESTINON) 60  MG tablet Take 1 tablet (60 mg total) by mouth 3 (three) times daily. 04/24/14  Yes Melvenia Beam, MD  timolol (BETIMOL) 0.5 % ophthalmic solution Place 1 drop into both eyes 2 (two) times daily.   Yes [provider]  torsemide (DEMADEX) 10 MG tablet Take 10 mg by mouth daily.   Yes [provider]  Emollient (LUBRIDERM SERIOUSLY SENSITIVE) LOTN Apply 1 application topically 4 (four) times daily as needed (dry skin). Patient not taking: Reported on 11/25/2020    [provider]  fexofenadine (ALLEGRA) 180 MG tablet Take 180 mg by mouth daily as needed for allergies. Patient not taking: Reported on 11/25/2020    [provider]  Insulin Pen Needle (NOVOFINE) 30G X 8 MM MISC Inject 1 packet into the skin as needed.    [provider]  Ophthalmic Irrigation Solution (OCUSOFT EYE [place] OP) Apply 1 application to eye 4 (four) times daily as needed (eye exudate). Patient not taking: Reported on 11/25/2020    [provider]  Polyethyl Glycol-Propyl Glycol (SYSTANE OP) Apply 1 drop to eye daily as needed (dry eyes). Patient not taking: Reported on 11/25/2020    [provider]  polyethylene glycol (MIRALAX /  GLYCOLAX) 17 g packet Take 17 g by mouth daily as needed for mild constipation. Patient not taking: Reported on 11/25/2020    [provider]  senna (SENOKOT) 8.6 MG TABS tablet Take 2 tablets by mouth at bedtime as needed for mild constipation. Patient not taking: Reported on 11/25/2020    [provider]  simethicone (MYLICON) 80 MG chewable tablet Chew 80 mg by mouth 4 (four) times daily as needed for flatulence. Patient not taking: Reported on 11/25/2020    [provider]    Physical Exam: Vitals:   11/25/20 2204 11/25/20 2330 11/26/20 0053 11/26/20 0137  BP:  (!) 116/55  (!) 109/54  Pulse:  74  66  Resp:  14  18  Temp:   97.8 F (36.6 C) (!) 97.5 F (36.4 C)  TempSrc:   Oral Oral  SpO2:  99%  91%  Weight: 53.1 kg     Height: 5\' 3"  (1.6 m)       Physical Exam Constitutional:      General: She is not in acute distress. HENT:     Head: Normocephalic and atraumatic.     Mouth/Throat:     Mouth: Mucous membranes are dry.     Comments: Very dry mucous membranes Cardiovascular:     Rate and Rhythm: Normal rate and regular rhythm.     Pulses: Normal pulses.  Pulmonary:     Effort: Pulmonary effort is normal. No respiratory distress.     Breath sounds: No wheezing.  Abdominal:     General: Bowel sounds are normal. There is no distension.     Palpations: Abdomen is soft.     Tenderness: There is no abdominal tenderness.  Musculoskeletal:        General: No swelling or tenderness.     Cervical back: Normal range of motion and neck supple.  Skin:    General: Skin is warm and dry.     Comments: Small ulcer at the lateral aspect of the left heel. Large right heel ulcer with eschar.  Neurological:     General: No focal deficit present.     Mental Status: She is alert.     Comments: Slightly confused     Labs on Admission: I have personally reviewed  following labs and imaging studies  CBC: Recent Labs  Lab 11/25/20 2155  WBC 10.4   NEUTROABS 9.2*  HGB 9.2*  HCT 28.2*  MCV 99.3  PLT 382   Basic Metabolic Panel: Recent Labs  Lab 11/25/20 2155  NA 135  K 3.2*  CL 100  CO2 24  GLUCOSE 191*  BUN 41*  CREATININE 1.66*  CALCIUM 8.7*   GFR: Estimated Creatinine Clearance: 21.6 mL/min (A) (by C-G formula based on SCr of 1.66 mg/dL (H)). Liver Function Tests: Recent Labs  Lab 11/25/20 2155  AST 22  ALT 22  ALKPHOS 88  BILITOT 0.7  PROT 5.3*  ALBUMIN 2.1*   No results for input(s): LIPASE, AMYLASE in the last 168 hours. No results for input(s): AMMONIA in the last 168 hours. Coagulation Profile: No results for input(s): INR, PROTIME in the last 168 hours. Cardiac Enzymes: No results for input(s): CKTOTAL, CKMB, CKMBINDEX, TROPONINI in the last 168 hours. BNP (last 3 results) No results for input(s): PROBNP in the last 8760 hours. HbA1C: No results for input(s): HGBA1C in the last 72 hours. CBG: Recent Labs  Lab 11/26/20 0136  GLUCAP 142*   Lipid Profile: No results for input(s): CHOL, HDL, LDLCALC, TRIG, CHOLHDL, LDLDIRECT in the last 72 hours. Thyroid Function Tests: No results for input(s): TSH, T4TOTAL, FREET4, T3FREE, THYROIDAB in the last 72 hours. Anemia Panel: No results for input(s): VITAMINB12, FOLATE, FERRITIN, TIBC, IRON, RETICCTPCT in the last 72 hours. Urine analysis: No results found for: COLORURINE, APPEARANCEUR, LABSPEC, PHURINE, GLUCOSEU, HGBUR, BILIRUBINUR, KETONESUR, PROTEINUR, UROBILINOGEN, NITRITE, LEUKOCYTESUR  Radiological Exams on Admission: DG Chest 2 View  Result Date: 11/25/2020 CLINICAL DATA:  Infection. EXAM: CHEST - 2 VIEW COMPARISON:  02/03/2020. FINDINGS: The heart is borderline enlarged the pulmonary vasculature is distended. Mild bronchiectasis is noted bilaterally. There is patchy airspace disease in the perihilar region on the left and consolidation at the left lung base. Small left pleural effusion is noted. No pneumothorax is seen. No acute osseous  abnormality. IMPRESSION: 1. Cardiomegaly with mildly distended pulmonary vasculature. 2. Patchy airspace disease in the left lung with consolidation at the left lung base. 3. Small left pleural effusion. Electronically Signed   By: Brett Fairy M.D.   On: 11/25/2020 22:32   DG Foot Complete Right  Result Date: 11/25/2020 CLINICAL DATA:  Wound to right heel.  Diabetic patient. EXAM: RIGHT FOOT COMPLETE - 3+ VIEW COMPARISON:  None. FINDINGS: Bony under mineralization. Hammertoe deformity of the digits. No erosion, periosteal reaction, or bone destruction. Mild midfoot degenerative change with spurring. There is a moderate plantar calcaneal spur. Vascular calcifications are seen. Mild skin irregularity overlies the heel. No soft tissue air. No radiopaque foreign body. Overlying dressing in place. IMPRESSION: 1. No radiographic evidence of osteomyelitis. 2. Skin irregularity overlies the heel may represent site of laceration. No radiopaque foreign body or soft tissue air. 3. Bony under mineralization with hammertoe deformity of the digits and mild midfoot degenerative change. Electronically Signed   By: Keith Rake M.D.   On: 11/25/2020 23:35    EKG: Independently reviewed.  Sinus rhythm, T wave abnormality in anterolateral leads.  No significant change compared to prior tracings from October 2022.  Assessment/Plan Principal Problem:   CAP (community acquired pneumonia) Active Problems:   Sepsis ([place])   AKI (acute kidney injury) ([place])   Acute metabolic encephalopathy   Elevated troponin   Community-acquired pneumonia Possible sepsis Patient is endorsing productive cough.  Chest x-ray showing patchy airspace disease in  the perihilar region on the left and consolidation at the left lung base and small left pleural effusion.  COVID and influenza PCR negative.  No fever or significant leukocytosis.  However, blood pressure soft on arrival and lactate elevated (5.3>5.1) concerning for possible  sepsis versus dehydration. -Continue ceftriaxone and azithromycin.  Mucinex for cough.  Patient was given 1 L fluid bolus in the ED, continue IV fluid hydration and trend lactate.  Order blood cultures.  AKI on CKD stage II-IIIa Likely prerenal azotemia from dehydration/possible sepsis.  BUN 41, creatinine 1.6 (baseline 0.9). -Continue IV fluid hydration.  Monitor renal function and urine output.  Avoid nephrotoxic agents/hold home losartan and torsemide.  Acute metabolic encephalopathy Likely due to infection, dehydration, and AKI.  Slightly confused but no focal neurodeficit. -Continue antibiotics and IV fluid  Elevated troponin CAD Mild troponin elevation likely due to demand ischemia.  High-sensitivity troponin 44.  EKG without significant change compared to prior tracings from last month.  Patient is not endorsing any chest pain. -Trend troponin.  Continue aspirin and Lipitor.  Chronic hypoxic respiratory failure Currently stable on 3 L oxygen, same as home requirement. -Continue supplemental oxygen  Mild hypokalemia -Monitor potassium and magnesium levels, replace if low.  Dysuria -UA pending  Hypertension -Hold antihypertensives at this time  Hyperlipidemia -Continue Lipitor  Type 2 diabetes Previously diet controlled, A1c 6.8 on 10/23/2020.  Reportedly blood glucose elevated to the 400s and was given 10 units of insulin at her nursing facility.  Blood glucose 191 on labs done in the ED. hyperglycemia likely due to chronic steroid use. 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  Outpatient rheumatology and ophthalmology follow-up.  Chronic combined CHF BNP 267 but appears dehydrated on exam. -Hold home diuretic given AKI.  Monitor volume status closely.  Chronic anemia Hemoglobin stable.  Protein calorie malnutrition Albumin 2.1. -Nutrition consult  Chronic  right heel ulcer X-ray negative for osteomyelitis. -Wound care  GERD -Continue PPI  DVT prophylaxis: Subcutaneous heparin Code Status: DNR   Family Communication: No family available at this time. Disposition Plan: Status is: Inpatient  Remains inpatient appropriate because: Pneumonia/possible sepsis, AKI  Level of care:  Level of care: Telemetry  The medical decision making on this patient was of high complexity and the patient is at high risk for clinical deterioration, therefore this is a level 3 visit.  Shela Leff MD [facility]  If 7PM-7AM, please contact night-coverage [email or web address]  11/26/2020, 1:57 AM

## 2020-11-27 LAB — GLUCOSE, CAPILLARY
Glucose-Capillary: 143 mg/dL — ABNORMAL HIGH (ref 70–99)
Glucose-Capillary: 142 mg/dL — ABNORMAL HIGH (ref 70–99)
Glucose-Capillary: 171 mg/dL — ABNORMAL HIGH (ref 70–99)
Glucose-Capillary: 280 mg/dL — ABNORMAL HIGH (ref 70–99)

## 2020-11-27 LAB — CBC
HCT: 30.9 % — ABNORMAL LOW (ref 36.0–46.0)
Hemoglobin: 9.8 g/dL — ABNORMAL LOW (ref 12.0–15.0)
MCH: 32.5 pg (ref 26.0–34.0)
MCHC: 31.7 g/dL (ref 30.0–36.0)
MCV: 102.3 fL — ABNORMAL HIGH (ref 80.0–100.0)
Platelets: 160 10*3/uL (ref 150–400)
RBC: 3.02 MIL/uL — ABNORMAL LOW (ref 3.87–5.11)
RDW: 13.7 % (ref 11.5–15.5)
WBC: 21.8 10*3/uL — ABNORMAL HIGH (ref 4.0–10.5)
nRBC: 0 % (ref 0.0–0.2)

## 2020-11-27 LAB — COMPREHENSIVE METABOLIC PANEL
ALT: 24 U/L (ref 0–44)
AST: 25 U/L (ref 15–41)
Albumin: 2.3 g/dL — ABNORMAL LOW (ref 3.5–5.0)
Alkaline Phosphatase: 125 U/L (ref 38–126)
Anion gap: 9 (ref 5–15)
BUN: 33 mg/dL — ABNORMAL HIGH (ref 8–23)
CO2: 23 mmol/L (ref 22–32)
Calcium: 8.7 mg/dL — ABNORMAL LOW (ref 8.9–10.3)
Chloride: 107 mmol/L (ref 98–111)
Creatinine, Ser: 1.17 mg/dL — ABNORMAL HIGH (ref 0.44–1.00)
GFR, Estimated: 47 mL/min — ABNORMAL LOW (ref >60)
Glucose, Bld: 277 mg/dL — ABNORMAL HIGH (ref 70–99)
Potassium: 5 mmol/L (ref 3.5–5.1)
Sodium: 139 mmol/L (ref 135–145)
Total Bilirubin: 0.6 mg/dL (ref 0.3–1.2)
Total Protein: 5.5 g/dL — ABNORMAL LOW (ref 6.5–8.1)

## 2020-11-27 LAB — MAGNESIUM: Magnesium: 1.7 mg/dL (ref 1.7–2.4)

## 2020-11-27 MED ORDER — PREDNISONE 20 MG PO TABS: 20.0000 mg | ORAL_TABLET | Freq: Every day | ORAL | Status: DC

## 2020-11-27 MED ORDER — ENSURE ENLIVE PO LIQD
237.0000 mL | ORAL | Status: DC
  Administered 2020-11-27 – 2020-11-29 (×2): 237 mL via ORAL

## 2020-11-27 MED ORDER — PREDNISONE 5 MG PO TABS: 10.0000 mg | ORAL_TABLET | Freq: Every day | ORAL | Status: DC

## 2020-11-27 MED ORDER — FUROSEMIDE 10 MG/ML IJ SOLN
20.0000 mg | Freq: Every day | INTRAMUSCULAR | Status: DC
  Administered 2020-11-28 – 2020-11-30 (×3): 20 mg via INTRAVENOUS
  Filled 2020-11-27 (×3): qty 2

## 2020-11-27 MED ORDER — BOOST / RESOURCE BREEZE PO LIQD CUSTOM
1.0000 | Freq: Two times a day (BID) | ORAL | Status: DC
  Administered 2020-11-27 – 2020-11-30 (×4): 1 via ORAL

## 2020-11-27 MED ORDER — NYSTATIN 100000 UNIT/ML MT SUSP
5.0000 mL | Freq: Four times a day (QID) | OROMUCOSAL | Status: DC
Start: 2020-11-27 — End: 2020-11-30
  Administered 2020-11-27 – 2020-11-30 (×10): 500000 [IU] via ORAL
  Filled 2020-11-27 (×9): qty 5

## 2020-11-27 MED ORDER — PREDNISONE 5 MG PO TABS
10.0000 mg | ORAL_TABLET | Freq: Every day | ORAL | Status: DC
  Administered 2020-11-28 – 2020-11-30 (×3): 10 mg via ORAL
  Filled 2020-11-27 (×3): qty 2

## 2020-11-27 MED ORDER — JUVEN PO PACK
1.0000 | PACK | Freq: Two times a day (BID) | ORAL | Status: DC
Start: 2020-11-27 — End: 2020-11-30
  Administered 2020-11-27 – 2020-11-30 (×6): 1 via ORAL
  Filled 2020-11-27 (×7): qty 1

## 2020-11-27 MED ORDER — ADULT MULTIVITAMIN W/MINERALS CH
1.0000 | ORAL_TABLET | Freq: Every day | ORAL | Status: DC
  Administered 2020-11-27 – 2020-11-30 (×4): 1 via ORAL
  Filled 2020-11-27 (×4): qty 1

## 2020-11-27 NOTE — NC FL2 (Signed)
[place] LEVEL OF CARE SCREENING TOOL     IDENTIFICATION  Patient Name: Terry Alexander Birthdate: 20-Jan-1940 Sex: female Admission Date (Current Location): 11/25/2020  [facility] and [place] Number:  Herbalist and Address:         Provider Number: [id number]  Attending Physician Name and Address:  Kathie Dike, MD  Relative Name and Phone Number:       Current Level of Care: Hospital Recommended Level of Care: [place] Prior Approval Number:    Date Approved/Denied:   PASRR Number: [id number]  Discharge Plan: SNF    Current Diagnoses: Patient Active Problem List   Diagnosis Date Noted   CAP (community acquired pneumonia) 11/26/2020   Sepsis ([place]) 11/26/2020   AKI (acute kidney injury) ([place]) 19/37/9024   Acute metabolic encephalopathy 09/73/5329   Elevated troponin 11/26/2020   Pressure injury of skin 11/26/2020   Anemia 11/19/2020   Protein-calorie malnutrition, severe 10/26/2020   NSTEMI (non-ST elevated myocardial infarction) ([place]) 10/24/2020   Pressure injury of right heel, stage 2 ([place]) 10/24/2020   Generalized weakness 10/23/2020   High risk medication use 02/13/2020   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/2019   Lower extremity edema 12/21/2019   Sjogren syndrome with keratoconjunctivitis ([place]) 12/21/2019   Diabetes mellitus ([place]) 07/11/2019   Hypercholesterolemia 07/11/2019   Hypertensive disorder 07/11/2019   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   Ptosis 04/04/2014   Vision changes 04/04/2014    Orientation RESPIRATION BLADDER Height & Weight     Self, Time, Situation, Place  Normal Continent Weight: 53.1 kg Height:  5\' 3"  (160 cm)  BEHAVIORAL SYMPTOMS/MOOD NEUROLOGICAL BOWEL NUTRITION STATUS      Continent Diet (regular)  AMBULATORY STATUS COMMUNICATION OF NEEDS Skin   Limited Assist Verbally Normal                       Personal Care Assistance Level of Assistance  Bathing, Feeding, Dressing Bathing Assistance:  Limited assistance Feeding assistance: Limited assistance Dressing Assistance: Limited assistance     Functional Limitations Info  Sight, Hearing, Speech Sight Info: Adequate Hearing Info: Adequate Speech Info: Adequate    SPECIAL CARE FACTORS FREQUENCY  PT (By licensed PT), OT (By licensed OT)     PT Frequency: pt x 5 weekly OT Frequency: 5 x weekly            Contractures Contractures Info: Not present    Additional Factors Info  Code Status Code Status Info: DNR             Current Medications (11/27/2020):  This is the current hospital active medication list Current Facility-Administered Medications  Medication Dose Route Frequency Provider Last Rate Last Admin   acetaminophen (TYLENOL) tablet 650 mg  650 mg Oral Q6H PRN Shela Leff, MD       Or   acetaminophen (TYLENOL) suppository 650 mg  650 mg Rectal Q6H PRN Shela Leff, MD       artificial tears (LACRILUBE) ophthalmic ointment   Both Eyes QHS Shela Leff, MD   Given at 11/26/20 2211   aspirin EC tablet 81 mg  81 mg Oral Daily Shela Leff, MD   81 mg at 11/27/20 1039   atorvastatin (LIPITOR) tablet 10 mg  10 mg Oral QHS Shela Leff, MD   10 mg at 11/26/20 2210   azithromycin (ZITHROMAX) tablet 500 mg  500 mg Oral QHS Leodis Sias T, RPH   500 mg at 11/26/20 2210   cefTRIAXone (ROCEPHIN) 1  g in sodium chloride 0.9 % 100 mL IVPB  1 g Intravenous Q24H Shela Leff, MD 200 mL/hr at 11/26/20 2229 1 g at 11/26/20 2229   cycloSPORINE (RESTASIS) 0.05 % ophthalmic emulsion 1 drop  1 drop Both Eyes BID Shela Leff, MD   1 drop at 11/27/20 1056   dorzolamide (TRUSOPT) 2 % ophthalmic solution 1 drop  1 drop Both Eyes BID Shela Leff, MD   1 drop at 11/27/20 1049   erythromycin ophthalmic ointment 1 application  1 application Left Eye TID Shela Leff, MD   1 application at 01/74/94 1054   feeding supplement (BOOST / RESOURCE BREEZE) liquid 1 Container  1  Container Oral BID BM Kathie Dike, MD       feeding supplement (ENSURE ENLIVE / ENSURE PLUS) liquid 237 mL  237 mL Oral Q24H Memon, Jolaine Artist, MD       gabapentin (NEURONTIN) capsule 300 mg  300 mg Oral QHS Shela Leff, MD   300 mg at 11/26/20 2210   gatifloxacin (ZYMAXID) 0.5 % ophthalmic drops 1 drop  1 drop Both Eyes Daily Shela Leff, MD   1 drop at 11/27/20 1051   guaiFENesin (MUCINEX) 12 hr tablet 600 mg  600 mg Oral BID Shela Leff, MD   600 mg at 11/27/20 1044   heparin injection 5,000 Units  5,000 Units Subcutaneous Q8H Shela Leff, MD   5,000 Units at 11/27/20 0511   insulin aspart (novoLOG) injection 0-5 Units  0-5 Units Subcutaneous QHS Shela Leff, MD   2 Units at 11/26/20 2214   insulin aspart (novoLOG) injection 0-9 Units  0-9 Units Subcutaneous TID WC Shela Leff, MD   1 Units at 11/27/20 1200   mirtazapine (REMERON) tablet 15 mg  15 mg Oral QHS Shela Leff, MD   15 mg at 11/26/20 2210   multivitamin with minerals tablet 1 tablet  1 tablet Oral Daily Kathie Dike, MD       nutrition supplement (JUVEN) (JUVEN) powder packet 1 packet  1 packet Oral BID BM Kathie Dike, MD       pantoprazole (PROTONIX) EC tablet 40 mg  40 mg Oral Daily Shela Leff, MD   40 mg at 11/27/20 1044   prednisoLONE acetate (PRED FORTE) 1 % ophthalmic suspension 1 drop  1 drop Right Eye Daily Shela Leff, MD   1 drop at 11/27/20 1054   predniSONE (DELTASONE) tablet 40 mg  40 mg Oral Q breakfast Shela Leff, MD   40 mg at 11/27/20 1042   pyridostigmine (MESTINON) tablet 60 mg  60 mg Oral TID Shela Leff, MD   60 mg at 11/27/20 1101   timolol (TIMOPTIC) 0.5 % ophthalmic solution 1 drop  1 drop Both Eyes BID Shela Leff, MD   1 drop at 11/27/20 1047     Discharge Medications: Please see discharge summary for a list of discharge medications.  Relevant Imaging Results:  Relevant Lab Results:   Additional  Information SS#: [id number]  Leeroy Cha, RN

## 2020-11-27 NOTE — Progress Notes (Signed)
PROGRESS NOTE    Terry Alexander  KGU:[id number] DOB: 14-Apr-1940 DOA: 11/25/2020 PCP: Janifer Adie, MD    Brief Narrative:  80 year old female with a history of hypertension, diabetes, Sjogren's syndrome, who is currently at a skilled nursing facility, admitted to the hospital with altered mental status, dehydration, pneumonia.  She was started on antibiotics.  Concern for aspiration and she is being seen by speech therapy.  Dehydration has improved with IV fluids.  Anticipate discharge back to skilled nursing facility in the next 1 to 2 days.   Assessment & Plan:   Principal Problem:   CAP (community acquired pneumonia) Active Problems:   Diabetes mellitus ([place])   Sepsis ([place])   AKI (acute kidney injury) ([place])   Acute metabolic encephalopathy   Elevated troponin   Pressure injury of skin   Pneumonia.  Community-acquired pneumonia versus aspiration -Currently on IV antibiotics -Continue pulmonary hygiene -Seen by speech therapy and plans are for MBSS   Acute kidney injury -Related to dehydration in the setting of ARB and diuretics -Creatinine 1.6 on admission -This has improved to 1.1 with hydration   Acute metabolic encephalopathy -Overall mental status appears to be improving -Encephalopathy secondary to dehydration/infection -Likely approaching baseline -Of note, she did have an episode of hospital delirium during her last hospitalization   Chronic hypoxic respiratory failure -Chronically on 3 L oxygen -Oxygen requirement currently appears to be at baseline   Chronic combined CHF -EF 45 to 62% grade 1 diastolic dysfunction -Currently appears compensated -Restarted on Lasix since she does appear to be developing anasarca   Diabetes -Patient is chronically on linagliptin as well as NovoLog -Monitor on sliding scale insulin -Blood sugars have been stable   Sjogren's syndrome with keratoconjunctivitis -Followed by ophthalmology and rheumatology -Chronically on  prednisone -Skilled nursing facility records reviewed and prednisone taper has been adjusted  Leukocytosis -Likely related to prednisone  Oral thrush -Started on nystatin solution  DVT prophylaxis: heparin injection 5,000 Units Start: 11/26/20 0600  Code Status: DNR Family Communication: Updated patient's sister over the phone Disposition Plan: Status is: Inpatient  Remains inpatient appropriate because: Continued treatment of pneumonia, work-up of dysphagia    Consultants:    Procedures:    Antimicrobials:  Ceftriaxone 11/14 > Azithromycin 11/14 >   Subjective: She denies any complaints.  Continues to have cough.  Objective: Vitals:   11/26/20 2028 11/27/20 0335 11/27/20 1015 11/27/20 1425  BP: 126/60 131/84 119/61 135/69  Pulse: 99 88 68 (!) 48  Resp: 18 18  14   Temp: 97.9 F (36.6 C) 98.1 F (36.7 C) 98 F (36.7 C) (!) 97.5 F (36.4 C)  TempSrc: Oral Oral Oral Oral  SpO2: 100% 95% 96% 100%  Weight:      Height:        Intake/Output Summary (Last 24 hours) at 11/27/2020 2131 Last data filed at 11/27/2020 1300 Gross per 24 hour  Intake 1176.68 ml  Output 300 ml  Net 876.68 ml   Filed Weights   11/25/20 2204  Weight: 53.1 kg    Examination:  General exam: Appears calm and comfortable  Respiratory system: Clear to auscultation. Respiratory effort normal. Cardiovascular system: S1 & S2 heard, RRR. No JVD, murmurs, rubs, gallops or clicks. No pedal edema. Gastrointestinal system: Abdomen is nondistended, soft and nontender. No organomegaly or masses felt. Normal bowel sounds heard. Central nervous system: Alert and oriented. No focal neurological deficits. Extremities: Symmetric 5 x 5 power. Skin: No rashes, lesions or ulcers Psychiatry: Judgement and  insight appear normal. Mood & affect appropriate.     Data Reviewed: I have personally reviewed following labs and imaging studies  CBC: Recent Labs  Lab 11/25/20 2155 11/26/20 0228  11/27/20 2019  WBC 10.4 11.9* 21.8*  NEUTROABS 9.2*  --   --   HGB 9.2* 9.9* 9.8*  HCT 28.2* 30.0* 30.9*  MCV 99.3 98.7 102.3*  PLT 189 191 149   Basic Metabolic Panel: Recent Labs  Lab 11/25/20 2155 11/26/20 0228 11/27/20 2019  NA 135 136 139  K 3.2* 4.0 5.0  CL 100 101 107  CO2 24 24 23   GLUCOSE 191* 115* 277*  BUN 41* 27* 33*  CREATININE 1.66* 1.49* 1.17*  CALCIUM 8.7* 8.5* 8.7*  MG  --  1.7 1.7   GFR: Estimated Creatinine Clearance: 30.7 mL/min (A) (by C-G formula based on SCr of 1.17 mg/dL (H)). Liver Function Tests: Recent Labs  Lab 11/25/20 2155 11/27/20 2019  AST 22 25  ALT 22 24  ALKPHOS 88 125  BILITOT 0.7 0.6  PROT 5.3* 5.5*  ALBUMIN 2.1* 2.3*   No results for input(s): LIPASE, AMYLASE in the last 168 hours. No results for input(s): AMMONIA in the last 168 hours. Coagulation Profile: No results for input(s): INR, PROTIME in the last 168 hours. Cardiac Enzymes: No results for input(s): CKTOTAL, CKMB, CKMBINDEX, TROPONINI in the last 168 hours. BNP (last 3 results) No results for input(s): PROBNP in the last 8760 hours. HbA1C: No results for input(s): HGBA1C in the last 72 hours. CBG: Recent Labs  Lab 11/26/20 1658 11/26/20 2200 11/27/20 0725 11/27/20 1123 11/27/20 1608  GLUCAP 182* 228* 143* 142* 171*   Lipid Profile: No results for input(s): CHOL, HDL, LDLCALC, TRIG, CHOLHDL, LDLDIRECT in the last 72 hours. Thyroid Function Tests: No results for input(s): TSH, T4TOTAL, FREET4, T3FREE, THYROIDAB in the last 72 hours. Anemia Panel: No results for input(s): VITAMINB12, FOLATE, FERRITIN, TIBC, IRON, RETICCTPCT in the last 72 hours. Sepsis Labs: Recent Labs  Lab 11/25/20 2155 11/26/20 0018 11/26/20 0228  LATICACIDVEN 5.3* 5.1* 4.6*    Recent Results (from the past 240 hour(s))  Culture, blood (routine x 2)     Status: None (Preliminary result)   Collection Time: 11/25/20  9:55 PM   Specimen: BLOOD  Result Value Ref Range Status    Specimen Description   Final    BLOOD LEFT ARM Performed at [facility] [address] [place], [place] [address]    Special Requests   Final    BOTTLES DRAWN AEROBIC ONLY Blood Culture adequate volume Performed at [facility] [address] [place], [place] [address]    Culture   Final    NO GROWTH 1 DAY Performed at [facility], [place] [address], [place], [place] [address]    Report Status PENDING  Incomplete  Resp Panel by RT-PCR (Flu A&B, Covid) Nasopharyngeal Swab     Status: None   Collection Time: 11/25/20 11:23 PM   Specimen: Nasopharyngeal Swab; Nasopharyngeal(NP) swabs in vial transport medium  Result Value Ref Range Status   SARS Coronavirus 2 by RT PCR NEGATIVE NEGATIVE Final    Comment: (NOTE) SARS-CoV-2 target nucleic acids are NOT DETECTED.  The SARS-CoV-2 RNA is generally detectable in upper respiratory specimens during the acute phase of infection. The lowest concentration of SARS-CoV-2 viral copies this assay can detect is 138 copies/mL. A negative result does not preclude SARS-Cov-2 infection and should not be used as the sole basis  for treatment or other patient management decisions. A negative result may occur with  improper specimen collection/handling, submission of specimen other than nasopharyngeal swab, presence of viral mutation(s) within the areas targeted by this assay, and inadequate number of viral copies(<138 copies/mL). A negative result must be combined with clinical observations, patient history, and epidemiological information. The expected result is Negative.  Fact Sheet for Patients:  EntrepreneurPulse.com.au  Fact Sheet for Healthcare Providers:  IncredibleEmployment.be  This test is no t yet approved or cleared by the [place] FDA and  has been authorized for detection and/or diagnosis of SARS-CoV-2 by FDA under an Emergency Use Authorization  (EUA). This EUA will remain  in effect (meaning this test can be used) for the duration of the COVID-19 declaration under Section 564(b)(1) of the Act, 21 U.S.C.section 360bbb-3(b)(1), unless the authorization is terminated  or revoked sooner.       Influenza A by PCR NEGATIVE NEGATIVE Final   Influenza B by PCR NEGATIVE NEGATIVE Final    Comment: (NOTE) The Xpert Xpress SARS-CoV-2/FLU/RSV plus assay is intended as an aid in the diagnosis of influenza from Nasopharyngeal swab specimens and should not be used as a sole basis for treatment. Nasal washings and aspirates are unacceptable for Xpert Xpress SARS-CoV-2/FLU/RSV testing.  Fact Sheet for Patients: EntrepreneurPulse.com.au  Fact Sheet for Healthcare Providers: IncredibleEmployment.be  This test is not yet approved or cleared by the [place] FDA and has been authorized for detection and/or diagnosis of SARS-CoV-2 by FDA under an Emergency Use Authorization (EUA). This EUA will remain in effect (meaning this test can be used) for the duration of the COVID-19 declaration under Section 564(b)(1) of the Act, 21 U.S.C. section 360bbb-3(b)(1), unless the authorization is terminated or revoked.  Performed at [facility], [place] [address] [place], [place] [address]   Culture, blood (routine x 2)     Status: None (Preliminary result)   Collection Time: 11/26/20  2:28 AM   Specimen: BLOOD  Result Value Ref Range Status   Specimen Description   Final    BLOOD LEFT WRIST Performed at [facility] [address], [place], [place] [address]    Special Requests   Final    BOTTLES DRAWN AEROBIC ONLY Blood Culture results may not be optimal due to an inadequate volume of blood received in culture bottles Performed at [facility] [address] [place], [place] [address]    Culture   Final    NO GROWTH 1 DAY Performed at [facility], [place] [address] [place],  [address]    Report Status PENDING  Incomplete  MRSA Next Gen by PCR, Nasal     Status: None   Collection Time: 11/26/20  3:41 AM   Specimen: Nasal Mucosa; Nasal Swab  Result Value Ref Range Status   MRSA by PCR Next Gen NOT DETECTED NOT DETECTED Final    Comment: (NOTE) The GeneXpert MRSA Assay (FDA approved for NASAL specimens only), is one component of a comprehensive MRSA colonization surveillance program. It is not intended to diagnose MRSA infection nor to guide or monitor treatment for MRSA infections. Test performance is not FDA approved in patients less than 69 years old. Performed at [facility], [place] [address] [place],  [address]          Radiology Studies: DG Chest 2 View  Result Date: 11/25/2020 CLINICAL DATA:  Infection. EXAM: CHEST - 2 VIEW COMPARISON:  02/03/2020. FINDINGS:  The heart is borderline enlarged the pulmonary vasculature is distended. Mild bronchiectasis is noted bilaterally. There is patchy airspace disease in the perihilar region on the left and consolidation at the left lung base. Small left pleural effusion is noted. No pneumothorax is seen. No acute osseous abnormality. IMPRESSION: 1. Cardiomegaly with mildly distended pulmonary vasculature. 2. Patchy airspace disease in the left lung with consolidation at the left lung base. 3. Small left pleural effusion. Electronically Signed   By: Brett Fairy M.D.   On: 11/25/2020 22:32   DG Foot Complete Right  Result Date: 11/25/2020 CLINICAL DATA:  Wound to right heel.  Diabetic patient. EXAM: RIGHT FOOT COMPLETE - 3+ VIEW COMPARISON:  None. FINDINGS: Bony under mineralization. Hammertoe deformity of the digits. No erosion, periosteal reaction, or bone destruction. Mild midfoot degenerative change with spurring. There is a moderate plantar calcaneal spur. Vascular calcifications are seen. Mild skin irregularity overlies the heel. No soft  tissue air. No radiopaque foreign body. Overlying dressing in place. IMPRESSION: 1. No radiographic evidence of osteomyelitis. 2. Skin irregularity overlies the heel may represent site of laceration. No radiopaque foreign body or soft tissue air. 3. Bony under mineralization with hammertoe deformity of the digits and mild midfoot degenerative change. Electronically Signed   By: Keith Rake M.D.   On: 11/25/2020 23:35        Scheduled Meds:  artificial tears   Both Eyes QHS   aspirin EC  81 mg Oral Daily   atorvastatin  10 mg Oral QHS   azithromycin  500 mg Oral QHS   cycloSPORINE  1 drop Both Eyes BID   dorzolamide  1 drop Both Eyes BID   erythromycin  1 application Left Eye TID   feeding supplement  1 Container Oral BID BM   feeding supplement  237 mL Oral Q24H   [START ON 11/28/2020] furosemide  20 mg Intravenous Daily   gabapentin  300 mg Oral QHS   gatifloxacin  1 drop Both Eyes Daily   guaiFENesin  600 mg Oral BID   heparin  5,000 Units Subcutaneous Q8H   insulin aspart  0-5 Units Subcutaneous QHS   insulin aspart  0-9 Units Subcutaneous TID WC   mirtazapine  15 mg Oral QHS   multivitamin with minerals  1 tablet Oral Daily   nutrition supplement (JUVEN)  1 packet Oral BID BM   nystatin  5 mL Oral QID   pantoprazole  40 mg Oral Daily   prednisoLONE acetate  1 drop Right Eye Daily   [START ON 11/28/2020] predniSONE  10 mg Oral Q breakfast   Followed by   Derrill Memo ON 12/03/2020] predniSONE  10 mg Oral Q breakfast   pyridostigmine  60 mg Oral TID   timolol  1 drop Both Eyes BID   Continuous Infusions:  cefTRIAXone (ROCEPHIN)  IV 1 g (11/26/20 2229)     LOS: 1 day    Time spent: 35 minutes    Kathie Dike, MD [facility]   If 7PM-7AM, please contact night-coverage [email or web address]  11/27/2020, 9:31 PM

## 2020-11-27 NOTE — TOC Progression Note (Signed)
Transition of Care [facility]) - Progression Note    Patient Details  Name: Terry Alexander MRN: [id number] Date of Birth: [birth year]  Transition of Care [facility]) CM/SW Contact  Leeroy Cha, RN Phone Number: 11/27/2020, 2:35 PM Clinical Narrative:     Terry Alexander sent to [facility] for dc back to [place] on [id number].  Expected Discharge Plan: [place] Barriers to Discharge: No Barriers Identified  Expected Discharge Plan and Services Expected Discharge Plan: [place]   Discharge Planning Services: CM Consult   Living arrangements for the past 2 months: [place]                                       Social Determinants of Health (SDOH) Interventions    Readmission Risk Interventions Readmission Risk Prevention Plan 11/26/2020  Transportation Screening Complete  PCP or Specialist Appt within 3-5 Days Complete  HRI or [place] Complete  Social Work Consult for [place] Planning/Counseling Complete  Palliative Care Screening Not Applicable  Medication Review Press photographer) Complete  Some recent data might be hidden

## 2020-11-27 NOTE — Evaluation (Signed)
Clinical/Bedside Swallow Evaluation Patient Details  Name: Terry Alexander MRN: [id number] Date of Birth: [birth year]  Today's Date: 11/27/2020 Time: SLP Start Time (ACUTE ONLY): 1820 SLP Stop Time (ACUTE ONLY): 1900 SLP Time Calculation (min) (ACUTE ONLY): 40 min  Past Medical History:  Past Medical History:  Diagnosis Date   Breast cancer ([place])    Right side   Diabetes ([place])    High cholesterol    Sjogren's syndrome ([place])    Stomach cancer ([place])    Past Surgical History:  Past Surgical History:  Procedure Laterality Date   ABDOMINAL HYSTERECTOMY     MASTECTOMY Right 1977   OTHER SURGICAL HISTORY  1977   Right breast removed    Tumor removed     Stomach   HPI:  80 yo female who has been at rehab, admitted to [facility] with AMS hyperglycemia CBGs in the 400s - diagnosed with CAP-  CXR showed patchy airspace disease in the perihilar region on the left and consolidation at the L lung base with small L pleural effusion. Pt with PMH + for Sjogren's disease, CAD, HLD, HTN, type 2 DM, stage 2-3 CKD, breast cancer s/p masectomy in 1977, stomach cancer, malnutrition, CAS with NSTEMI in 10/2020 and cardiomyopathy.  Pt with 55 pound weight loss.    Assessment / Plan / Recommendation  Clinical Impression  Pt greeted sitting upright in bed - wet voice inconsistently noted - which pt states is her baseline.  No focal CN deficits apparent. Sjogren's may contribute to potential esophageal deficits also  - pt denies esophageal symptoms however.  Pt able to self feed - Demonstated overt prolonged coughing when consuming thin liquids via straw  - suspect infiltration into trachea before swallow reflex.  This coughing episode was concerning for overt aspiration and appeared uncomfortable for Terry Alexander. She required time to recover from coughing episode - and then admitted that choking occurs "at times" with liquids more than solids.  Decreasing bolus size to tsp did not prevent coughing or wet voice.  Pt denies  having pna prior to current illness but does endorse some dysphagia previously. Recommend to modify diet for maximal comfort and MBS next am to determine most effective compensation strategies, etc *note plans to dc to SNF tomorrow.  Advised RN, pt and MD and all in agreement. Posted swallow precaution signs and using teach back, educated pt to precautions, compensations.  In addition, pt appeared with oral candidiasis on tongue, buccal region and posterior - MD made aware - pt denies pain with mouth but admits to gustatory changes. SLP Visit Diagnosis: Dysphagia, unspecified (R13.10)    Aspiration Risk  Mild aspiration risk    Diet Recommendation Regular (nectar)   Liquid Administration via: Straw;Cup Medication Administration: Whole meds with puree Supervision: Patient able to self feed Compensations: Slow rate;Small sips/bites Postural Changes: Seated upright at 90 degrees;Remain upright for at least 30 minutes after po intake    Other  Recommendations Oral Care Recommendations: Oral care BID    Recommendations for follow up therapy are one component of a multi-disciplinary discharge planning process, led by the attending physician.  Recommendations may be updated based on patient status, additional functional criteria and insurance authorization.  Follow up Recommendations Skilled nursing-short term rehab (<3 hours/day)      Assistance Recommended at Discharge  assist  Functional Status Assessment Patient has had a recent decline in their functional status and demonstrates the ability to make significant improvements in function in a reasonable and predictable amount  of time.  Frequency and Duration min 1 x/week  1 week       Prognosis Prognosis for Safe Diet Advancement: Good      Swallow Study   General HPI: 80 yo female who has been at rehab, admitted to [facility] with AMS hyperglycemia CBGs in the 400s - diagnosed with CAP-  CXR showed patchy airspace disease in the perihilar  region on the left and consolidation at the L lung base with small L pleural effusion. Pt with PMH + for Sjogren's disease, CAD, HLD, HTN, type 2 DM, stage 2-3 CKD, breast cancer s/p masectomy in 1977, stomach cancer, malnutrition, CAS with NSTEMI in 10/2020 and cardiomyopathy.  Pt with 55 pound weight loss. Type of Study: Bedside Swallow Evaluation Diet Prior to this Study: Regular;Thin liquids Temperature Spikes Noted: No Respiratory Status: Nasal cannula History of Recent Intubation: No Behavior/Cognition: Alert;Cooperative;Pleasant mood Oral Cavity Assessment: Dry Oral Care Completed by SLP: No Oral Cavity - Dentition: Dentures, top;Dentures, bottom Vision: Functional for self-feeding Self-Feeding Abilities: Able to feed self Patient Positioning: Upright in bed Baseline Vocal Quality: Low vocal intensity Volitional Cough: Strong Volitional Swallow: Able to elicit    Oral/Motor/Sensory Function Overall Oral Motor/Sensory Function: Within functional limits   Ice Chips Ice chips: Not tested Other Comments: pt does not like ice   Thin Liquid Thin Liquid: Impaired Presentation: Self Fed;Straw;Spoon Pharyngeal  Phase Impairments: Cough - Delayed;Cough - Immediate Other Comments: immediate strong cough when consuming tea via straw, tsp boluses did not prevent delayed cough - pt advises that she does cough at times with thinner liquids - which is concerning for aspiration;    Nectar Thick Nectar Thick Liquid: Within functional limits Presentation: Cup;Self Fed;Straw   Honey Thick Honey Thick Liquid: Not tested   Puree Puree: Within functional limits Presentation: Self Fed;Spoon   Solid     Solid: Impaired Presentation: Self Fed Other Comments: minimal prolonged mastication with cracker and miminal retention, cleared with applesauce swallow      Macario Golds 11/27/2020,8:22 PM  Kathleen Lime, [facility] SLP [place] Office [phone] Pager [phone]

## 2020-11-27 NOTE — Progress Notes (Addendum)
Initial Nutrition Assessment  DOCUMENTATION CODES:   Severe malnutrition in context of chronic illness  INTERVENTION:  - will decrease Ensure Enlive from BID to once/day due to patient declining >50% of the time offered, each supplement provides 350 kcal and 20 grams of protein. - will order Boost Breeze BID, each supplement provides 250 kcal and 9 grams of protein. - will order 1 packet Juven BID, each packet provides 95 calories, 2.5 grams of protein (collagen), and 9.8 grams of carbohydrate (3 grams sugar); also contains 7 grams of L-arginine and L-glutamine, 300 mg vitamin C, 15 mg vitamin E, 1.2 mcg vitamin B-12, 9.5 mg zinc, 200 mg calcium, and 1.5 g  Calcium Beta-hydroxy-Beta-methylbutyrate to support wound healing. - will order 1 tablet multivitamin with minerals/day.  - recommend liberalize diet from Heart Healthy/Carb Modified to Regular d/t severe malnutrition and wounds.    NUTRITION DIAGNOSIS:   Severe Malnutrition related to chronic illness (extensive cardiac hx) as evidenced by moderate fat depletion, severe muscle depletion, percent weight loss.  GOAL:   Patient will meet greater than or equal to 90% of their needs  MONITOR:   PO intake, Supplement acceptance, Labs, Weight trends  REASON FOR ASSESSMENT:   Malnutrition Screening Tool, Consult Assessment of nutrition requirement/status  ASSESSMENT:   80 y.o. 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 She presented to the ED via EMS from nursing home for evaluation of AMS and hyperglycemia with CBGs in the 400s per nursing home staff. In the ED, CXR showed patchy airspace disease in the perihilar region on the left and consolidation at the L lung base with small L pleural effusion. X-ray of R foot was  negative for osteomyelitis.  She was able to eat 50% of breakfast and lunch yesterday. Patient noted to be a/o to self.   She was seen by a [place] RD on 10/26/20 at which time patient shared that she has difficulty with lifting legs at baseline and progressive weakness specifically in RLE. She uses a walker. She also shared that she has very poor vision at baseline and was having difficulty with taking care of herself. At that time she had reported a 55 lb weight loss in the previous 1 year.   Weight on 11/13 was 117 lb and weight on 10/14 was 124 lb. This indicates 7 lb weight loss (5.6% body weight) in the past 1 month.   Patient is awaiting SLP evaluation following GI assessment.   Per notes: - CAP with possible sepsis - AKI on CKD stage 2-3 - acute metabolic encephalopathy - hx of type 2 DM with HgbA1c on 10/23/20 of 6.8%   Labs reviewed; CBGs: 143 and 142 mg/dl, BUN: 27 mg/dl, creatinine: 1.49 mg/dl, Ca: 8.2 mg/dl, GFR: 35 ml/min.   Medications reviewed; sliding scale novolog, 40 mg oral protonix/day, 40 mg deltasone/day.     NUTRITION - FOCUSED PHYSICAL EXAM:  Flowsheet Row Most Recent Value  Orbital Region Mild depletion  Upper Arm Region Severe depletion  Thoracic and Lumbar Region Unable to assess  Buccal Region Moderate depletion  Temple Region Moderate depletion  Clavicle Bone Region Severe depletion  Clavicle and Acromion Bone Region Severe depletion  Scapular Bone Region Severe depletion  Dorsal Hand Moderate depletion  Patellar Region Severe depletion  Anterior Thigh Region Severe depletion  Posterior Calf  Region Severe depletion  Edema (RD Assessment) Mild  [BLE]  Hair Reviewed  Eyes Reviewed  Mouth Reviewed  Skin Reviewed  Nails Reviewed       Diet Order:   Diet Order             Diet heart healthy/carb modified Room service appropriate? Yes; Fluid consistency: Thin  Diet effective now                   EDUCATION NEEDS:   Not  appropriate for education at this time  Skin:  Skin Assessment: Skin Integrity Issues: Skin Integrity Issues:: Stage II, Unstageable Stage II: sacrum Unstageable: full thickness to R heel and L foot  Last BM:  11/14 (type 6)  Height:   Ht Readings from Last 1 Encounters:  11/25/20 5\' 3"  (1.6 m)    Weight:   Wt Readings from Last 1 Encounters:  11/25/20 53.1 kg     Estimated Nutritional Needs:  Kcal:  1750-2000 kcal Protein:  90-100 grams Fluid:  >/= 2 L/day    Jarome Matin, MS, RD, LDN, CNSC Inpatient Clinical Dietitian RD pager # available in [facility]  After hours/weekend pager # available in [facility]

## 2020-11-27 NOTE — Evaluation (Deleted)
SLP Cancellation Note  Patient Details Name: Terry Alexander MRN: [id number] DOB: 1940/05/08   Cancelled treatment:       Reason Eval/Treat Not Completed: Other (comment) (Pt has GI consult and GI MD is assigned to her.  Will wait to see pt until after their consult in event procedure needed. Thanks.)  Terry Lime, MS [facility] SLP Acute Rehab Services Office [phone] Pager [phone]  Macario Golds 11/27/2020, 9:10 AM

## 2020-11-27 NOTE — Plan of Care (Signed)
  Problem: Nutrition: Goal: Adequate nutrition will be maintained Outcome: Progressing   Problem: Coping: Goal: Level of anxiety will decrease Outcome: Progressing   Problem: Pain Managment: Goal: General experience of comfort will improve Outcome: Progressing   

## 2020-11-28 ENCOUNTER — Inpatient Hospital Stay (HOSPITAL_COMMUNITY): Payer: Medicare (Managed Care)

## 2020-11-28 ENCOUNTER — Ambulatory Visit: Payer: Medicare (Managed Care) | Admitting: Internal Medicine

## 2020-11-28 DIAGNOSIS — R778 Other specified abnormalities of plasma proteins: Secondary | ICD-10-CM

## 2020-11-28 DIAGNOSIS — N179 Acute kidney failure, unspecified: Secondary | ICD-10-CM

## 2020-11-28 DIAGNOSIS — J189 Pneumonia, unspecified organism: Secondary | ICD-10-CM

## 2020-11-28 DIAGNOSIS — G9341 Metabolic encephalopathy: Secondary | ICD-10-CM

## 2020-11-28 LAB — GLUCOSE, CAPILLARY
Glucose-Capillary: 203 mg/dL — ABNORMAL HIGH (ref 70–99)
Glucose-Capillary: 212 mg/dL — ABNORMAL HIGH (ref 70–99)
Glucose-Capillary: 220 mg/dL — ABNORMAL HIGH (ref 70–99)
Glucose-Capillary: 343 mg/dL — ABNORMAL HIGH (ref 70–99)

## 2020-11-28 LAB — RENAL FUNCTION PANEL
Albumin: 2.1 g/dL — ABNORMAL LOW (ref 3.5–5.0)
Anion gap: 9 (ref 5–15)
BUN: 34 mg/dL — ABNORMAL HIGH (ref 8–23)
CO2: 23 mmol/L (ref 22–32)
Calcium: 8.8 mg/dL — ABNORMAL LOW (ref 8.9–10.3)
Chloride: 106 mmol/L (ref 98–111)
Creatinine, Ser: 1.18 mg/dL — ABNORMAL HIGH (ref 0.44–1.00)
GFR, Estimated: 46 mL/min — ABNORMAL LOW (ref >60)
Glucose, Bld: 226 mg/dL — ABNORMAL HIGH (ref 70–99)
Phosphorus: 2.1 mg/dL — ABNORMAL LOW (ref 2.5–4.6)
Potassium: 4.3 mmol/L (ref 3.5–5.1)
Sodium: 138 mmol/L (ref 135–145)

## 2020-11-28 LAB — CBC
HCT: 28.1 % — ABNORMAL LOW (ref 36.0–46.0)
Hemoglobin: 9.2 g/dL — ABNORMAL LOW (ref 12.0–15.0)
MCH: 32.7 pg (ref 26.0–34.0)
MCHC: 32.7 g/dL (ref 30.0–36.0)
MCV: 100 fL (ref 80.0–100.0)
Platelets: 197 10*3/uL (ref 150–400)
RBC: 2.81 MIL/uL — ABNORMAL LOW (ref 3.87–5.11)
RDW: 13.6 % (ref 11.5–15.5)
WBC: 22.3 10*3/uL — ABNORMAL HIGH (ref 4.0–10.5)
nRBC: 0 % (ref 0.0–0.2)

## 2020-11-28 MED ORDER — IPRATROPIUM BROMIDE 0.02 % IN SOLN: 0.5000 mg | Freq: Four times a day (QID) | RESPIRATORY_TRACT | Status: DC | PRN

## 2020-11-28 MED ORDER — LEVALBUTEROL HCL 0.63 MG/3ML IN NEBU: 0.6300 mg | INHALATION_SOLUTION | Freq: Four times a day (QID) | RESPIRATORY_TRACT | Status: DC | PRN

## 2020-11-28 NOTE — Progress Notes (Signed)
Modified Barium Swallow Progress Note  Patient Details  Name: Terry Alexander MRN: [id number] Date of Birth: [birth year]  Today's Date: 11/28/2020  Modified Barium Swallow completed.  Full report located under Chart Review in the Imaging Section.  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  Testing was limited by pt's shoulders blocking UES/CP despite efforts to offset.  Impaired oral coordination noted resulting in premature spillage of liquids to pyriform and prolonged mastication and oral retention with solids. Pharyngeal swallow marked by impaired laryngeal elevation/closure, poor pharyngeal contraction allowing retention that mixed with secretions. Swallow was inconsistent in presentation, likely modulated by her effort and bolus size.   Pt conducts breath hold with up to five sequential swallows to clear a single bolus of thin liquids - causing SLP to suspect chronic nature of dysphagia that she has managed with self-taught compensations.   She does penetrate and aspirate with thin without awareness - protective cough response.  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  Chin tuck worsened airway protection as thin with secretions infiltrated larynx post-swallow.  Head turn left *pt leaning left* did decrease pharyngeal retention.  Wet vocal quality observed at baseline and after MBS, indicative of secretion aspiration. Pt endorses wet voice for several months.  Recommend dys3/nectar and allow thin water between meals with head turn left, multiple swallows.  Follow up with SLP at SNF indicated for dysphagia management.   Swallow Evaluation Recommendations       SLP Diet Recommendations: Dysphagia 3 (Mech soft) solids;Nectar thick liquid (thin water between meals)   Liquid Administration via: Cup;Straw   Medication  Administration: Crushed with puree   Supervision: Staff to assist with self feeding;Full supervision/cueing for compensatory strategies   Compensations: Slow rate;Small sips/bites;Follow solids with liquid (head turn left, intermitent cough/throat clear and re-swallow)   Postural Changes: Remain semi-upright after after feeds/meals (Comment);Seated upright at 90 degrees   Oral Care Recommendations: Oral care QID      Kathleen Lime, MS [facility] SLP Acute Rehab Services Office [phone] Pager [phone]   Macario Golds 11/28/2020,9:21 AM

## 2020-11-28 NOTE — Progress Notes (Signed)
PROGRESS NOTE    Terry Alexander  WIO:[id number] DOB: [birth year] DOA: 11/25/2020 PCP: Janifer Adie, MD   Brief Narrative:  The patient is an 80 year old African-American female with a past medical history significant for but not limited to hypertension, diabetes mellitus type 2, Sjogren's syndrome multiple other comorbidities who is currently a skilled nursing facility resident who was admitted to hospital with altered mental status, dehydration and pneumonia.  She is started on antibiotics.  There is concern for aspiration she was seen by speech therapy.  She was given IV fluids which is improved her dehydration but now has been started back on Diuretics.  SLP evaluated and recommending dysphagia 3 diet with nectar thick liquids.  Nutrition is also seen given her severe malnutrition in the context of chronic illness and made some recommendations.  Will repeat CXR in the AM   Assessment & Plan:   Principal Problem:   CAP (community acquired pneumonia) Active Problems:   Diabetes mellitus ([place])   Sepsis ([place])   AKI (acute kidney injury) ([place])   Acute metabolic encephalopathy   Elevated troponin   Pressure injury of skin   Pneumonia, present on admission likely community-acquired versus aspiration -CXR on 11/25/20 showed "Cardiomegaly with mildly distended pulmonary vasculature. Patchy airspace disease in the left lung with consolidation at the left lung base. Small left pleural effusion. -Continue antibiotics with azithromycin and ceftriaxone -C/w Guaifenesin 600 mg po BID and increase to 1200 mg po BID -Add Flutter Valve and Incentive Spirometry  -Repeat CXR in the AM  -Add Xopenex and Atrovent q6hprn  -SLP consulted and recommending  Dysphagia 3 Diet with Nectar Thick Liquids  -Will need to repeat CXR in the AM   AKI on CKD stage IIIa -Her AKI was likely in the setting of ARB and diuretic use -Creatinine was 1.6 on admission -Improved with IVF and now back on Diuretics with  Furosemide 20 mg IV Daily  -Patient's BUN to creatinine is improved to 33/1.17 -> 34/1.18 -Further nephrotoxic medications, contrast dyes, hypotension and dehydration and renally dose medications -Repeat CMP in a.m.  Acute metabolic encephalopathy -In the setting of infection and dehydration -Overall mental status appears to be improving and likely approaching baseline -Last hospitalization she did have a episode of hospital delirium -We will place on delirium precautions  Chronic hypoxic respiratory failure -Chronically is on 3 L supplemental oxygen -SpO2: 100 % O2 Flow Rate (L/min): 3 L/min -Currently her supplemental oxygen requirement appears to be baseline -Repeat chest x-ray in the a.m.  Chronic combined CHF -Echo showed an EF of 45 to 50% and grade 1 diastolic dysfunction -Currently appears compensated -She was restarted on Lasix given that she appeared that started developing anasarca and is now on IV   Diabetes mellitus type 2 -Chronically on linagliptin as well as NovoLog -Continue sliding scale insulin with Sensitive Novolog SSI AC -Continue to monitor blood sugars per protocol; CBGs have been ranging from  Sjoren's syndrome with chronic conjunctivitis -She is followed by ophthalmology and rheumatology in outpatient setting -She is on prednisone which we will continue with 10 mg po Daily  x5 doses followed by Prednisone 10 mg po Daily x 7 doses -C/w Prednisolone 1 drop in Right Eye Daily and Timolol 1 drop both Eyes BID as well as Gatifloxacin 1 drop in both eyes Daily, Erythromycin Ophthalmic Ointment 1 application in Left Ey TID, Dorzolamide 1 drop in both eyes BID, and Cyclosoporine 1 drop in both eyes BID and Aritificial Tears  both  eyes qHS -C/w Pyridostigmine 60 mg po TID  -SNF records were reviewed and prednisone taper was adjusted  Leukocytosis -Likely related to pneumonia versus prednisone usage given that she is chronically on prednisone -WBC went from 21.8  and trended up to 22.3 -Continue to monitor for signs and symptoms of infection -Repeat CBC in a.m.  HLD -C/w Atorvastatin 10 mg po qHS  Oral Thrush -She was initiated on Nystatin 500,000 units 4 times daily solution  Severe Malnutrition in the Context of Chronic illness -Consulted dietitian for further evaluation and recommendations -SLP recommended dysphagia 3 diet with nectar thick liquids -The dietitian has been consulted recommending decreasing her Trileptal from twice daily to once a day and recommending ordering boost breeze p.o. twice daily as well as 1 packet of Juven twice daily and ordering 1 tablet of multivitamin with minerals daily and recommending liberalizing diet from a heart healthy carb modified regular diet given her severe malnutrition and wounds  DVT prophylaxis: Heparin 5,000 units  Code Status: DO NOT RESUSCITATE  Family Communication: No family present at bedside  Disposition Plan: SNF once medically stable and ready   Status is: Inpatient  Remains inpatient appropriate because: She continues to get diuresed and get her nonmonitored bed anticipating discharge in next 24 to 48 hours   Consultants:  None  Procedures: None  Antimicrobials:  Anti-infectives (From admission, onward)    Start     Dose/Rate Route Frequency Ordered Stop   11/26/20 2300  azithromycin (ZITHROMAX) 500 mg in sodium chloride 0.9 % 250 mL IVPB  Status:  Discontinued        500 mg 250 mL/hr over 60 Minutes Intravenous Every 24 hours 11/26/20 0108 11/26/20 1101   11/26/20 2300  cefTRIAXone (ROCEPHIN) 1 g in sodium chloride 0.9 % 100 mL IVPB        1 g 200 mL/hr over 30 Minutes Intravenous Every 24 hours 11/26/20 0108     11/26/20 2200  azithromycin (ZITHROMAX) tablet 500 mg        500 mg Oral Daily at bedtime 11/26/20 1101     11/25/20 2315  cefTRIAXone (ROCEPHIN) 1 g in sodium chloride 0.9 % 100 mL IVPB        1 g 200 mL/hr over 30 Minutes Intravenous  Once 11/25/20 2306 11/25/20  2353   11/25/20 2315  azithromycin (ZITHROMAX) 500 mg in sodium chloride 0.9 % 250 mL IVPB        500 mg 250 mL/hr over 60 Minutes Intravenous  Once 11/25/20 2306 11/26/20 0039        Subjective: Seen and examined at bedside and she was resting and a little somnolent but easily arousable.  Denies any current pain.  No nausea or vomiting.  Denies any lightheadedness or dizziness.  No other concerns or complaints at this time.  Objective: Vitals:   11/27/20 1425 11/27/20 2140 11/28/20 0342 11/28/20 1325  BP: 135/69 127/69 (!) 142/62 120/72  Pulse: (!) 48 82 87 85  Resp: 14 17 17 14   Temp: (!) 97.5 F (36.4 C) 98.3 F (36.8 C) 98.4 F (36.9 C) 98 F (36.7 C)  TempSrc: Oral Oral Oral Oral  SpO2: 100% 92% 100% 100%  Weight:      Height:        Intake/Output Summary (Last 24 hours) at 11/28/2020 1547 Last data filed at 11/28/2020 0600 Gross per 24 hour  Intake 340 ml  Output 300 ml  Net 40 ml   Filed Weights   11/25/20  2204  Weight: 53.1 kg   Examination: Physical Exam:  Constitutional: Chronically ill-appearing African-American female currently in NAD and appears calm and comfortable Eyes: Lids and conjunctivae normal, sclerae anicteric  ENMT: External Ears, Nose appear normal. Grossly normal hearing. Mucous membranes are moist.  Neck: Appears normal, supple, no cervical masses, normal ROM, no appreciable thyromegaly or JVD Respiratory: Diminished to auscultation bilaterally with coarse breath sounds and some mild rhonchi.  No appreciable wheezing, rales or crackles. Normal respiratory effort and patient is not tachypenic. No accessory muscle use.  Unlabored breathing but is wearing supplemental oxygen via nasal cannula Cardiovascular: RRR, no murmurs / rubs / gallops. S1 and S2 auscultated.  Mild extremity edema.  Abdomen: Soft, non-tender, non-distended. No masses palpated. No appreciable hepatosplenomegaly. Bowel sounds positive.  GU: Deferred. Musculoskeletal: No  clubbing / cyanosis of digits/nails. No joint deformity upper and lower extremities.  Skin: No rashes, lesions on a limited skin evaluation. No induration; Warm and dry.  Neurologic: CN 2-12 grossly intact with no focal deficits. Romberg sign and cerebellar reflexes not assessed.  Psychiatric: Impaired judgment and insight. Slightly somnolent and drowsy but easily arousable. Normal mood and appropriate affect.   Data Reviewed: I have personally reviewed following labs and imaging studies  CBC: Recent Labs  Lab 11/25/20 2155 11/26/20 0228 11/27/20 2019 11/28/20 0440  WBC 10.4 11.9* 21.8* 22.3*  NEUTROABS 9.2*  --   --   --   HGB 9.2* 9.9* 9.8* 9.2*  HCT 28.2* 30.0* 30.9* 28.1*  MCV 99.3 98.7 102.3* 100.0  PLT 189 191 160 314   Basic Metabolic Panel: Recent Labs  Lab 11/25/20 2155 11/26/20 0228 11/27/20 2019 11/28/20 0440  NA 135 136 139 138  K 3.2* 4.0 5.0 4.3  CL 100 101 107 106  CO2 24 24 23 23   GLUCOSE 191* 115* 277* 226*  BUN 41* 27* 33* 34*  CREATININE 1.66* 1.49* 1.17* 1.18*  CALCIUM 8.7* 8.5* 8.7* 8.8*  MG  --  1.7 1.7  --   PHOS  --   --   --  2.1*   GFR: Estimated Creatinine Clearance: 30.4 mL/min (A) (by C-G formula based on SCr of 1.18 mg/dL (H)). Liver Function Tests: Recent Labs  Lab 11/25/20 2155 11/27/20 2019 11/28/20 0440  AST 22 25  --   ALT 22 24  --   ALKPHOS 88 125  --   BILITOT 0.7 0.6  --   PROT 5.3* 5.5*  --   ALBUMIN 2.1* 2.3* 2.1*   No results for input(s): LIPASE, AMYLASE in the last 168 hours. No results for input(s): AMMONIA in the last 168 hours. Coagulation Profile: No results for input(s): INR, PROTIME in the last 168 hours. Cardiac Enzymes: No results for input(s): CKTOTAL, CKMB, CKMBINDEX, TROPONINI in the last 168 hours. BNP (last 3 results) No results for input(s): PROBNP in the last 8760 hours. HbA1C: No results for input(s): HGBA1C in the last 72 hours. CBG: Recent Labs  Lab 11/27/20 1123 11/27/20 1608  11/27/20 2137 11/28/20 0743 11/28/20 1135  GLUCAP 142* 171* 280* 203* 212*   Lipid Profile: No results for input(s): CHOL, HDL, LDLCALC, TRIG, CHOLHDL, LDLDIRECT in the last 72 hours. Thyroid Function Tests: No results for input(s): TSH, T4TOTAL, FREET4, T3FREE, THYROIDAB in the last 72 hours. Anemia Panel: No results for input(s): VITAMINB12, FOLATE, FERRITIN, TIBC, IRON, RETICCTPCT in the last 72 hours. Sepsis Labs: Recent Labs  Lab 11/25/20 2155 11/26/20 0018 11/26/20 0228  LATICACIDVEN 5.3* 5.1* 4.6*  Recent Results (from the past 240 hour(s))  Culture, blood (routine x 2)     Status: None (Preliminary result)   Collection Time: 11/25/20  9:55 PM   Specimen: BLOOD  Result Value Ref Range Status   Specimen Description   Final    BLOOD LEFT ARM Performed at [facility] [address], [place], [place] [address]    Special Requests   Final    BOTTLES DRAWN AEROBIC ONLY Blood Culture adequate volume Performed at [facility] [address], [place], [place] [address]    Culture   Final    NO GROWTH 2 DAYS Performed at [facility] [address], [place], [place] [address]    Report Status PENDING  Incomplete  Resp Panel by RT-PCR (Flu A&B, Covid) Nasopharyngeal Swab     Status: None   Collection Time: 11/25/20 11:23 PM   Specimen: Nasopharyngeal Swab; Nasopharyngeal(NP) swabs in vial transport medium  Result Value Ref Range Status   SARS Coronavirus 2 by RT PCR NEGATIVE NEGATIVE Final    Comment: (NOTE) SARS-CoV-2 target nucleic acids are NOT DETECTED.  The SARS-CoV-2 RNA is generally detectable in upper respiratory specimens during the acute phase of infection. The lowest concentration of SARS-CoV-2 viral copies this assay can detect is 138 copies/mL. A negative result does not preclude SARS-Cov-2 infection and should not be used as the sole basis for treatment or other patient management decisions. A negative  result may occur with  improper specimen collection/handling, submission of specimen other than nasopharyngeal swab, presence of viral mutation(s) within the areas targeted by this assay, and inadequate number of viral copies(<138 copies/mL). A negative result must be combined with clinical observations, patient history, and epidemiological information. The expected result is Negative.  Fact Sheet for Patients:  EntrepreneurPulse.com.au  Fact Sheet for Healthcare Providers:  IncredibleEmployment.be  This test is no t yet approved or cleared by the [place] FDA and  has been authorized for detection and/or diagnosis of SARS-CoV-2 by FDA under an Emergency Use Authorization (EUA). This EUA will remain  in effect (meaning this test can be used) for the duration of the COVID-19 declaration under Section 564(b)(1) of the Act, 21 U.S.C.section 360bbb-3(b)(1), unless the authorization is terminated  or revoked sooner.       Influenza A by PCR NEGATIVE NEGATIVE Final   Influenza B by PCR NEGATIVE NEGATIVE Final    Comment: (NOTE) The Xpert Xpress SARS-CoV-2/FLU/RSV plus assay is intended as an aid in the diagnosis of influenza from Nasopharyngeal swab specimens and should not be used as a sole basis for treatment. Nasal washings and aspirates are unacceptable for Xpert Xpress SARS-CoV-2/FLU/RSV testing.  Fact Sheet for Patients: EntrepreneurPulse.com.au  Fact Sheet for Healthcare Providers: IncredibleEmployment.be  This test is not yet approved or cleared by the [place] FDA and has been authorized for detection and/or diagnosis of SARS-CoV-2 by FDA under an Emergency Use Authorization (EUA). This EUA will remain in effect (meaning this test can be used) for the duration of the COVID-19 declaration under Section 564(b)(1) of the Act, 21 U.S.C. section 360bbb-3(b)(1), unless the authorization is  terminated or revoked.  Performed at [facility], [place] [address], [place], [place] [address]   Culture, blood (routine x 2)     Status: None (Preliminary result)   Collection Time: 11/26/20  2:28 AM   Specimen: BLOOD  Result Value Ref Range Status   Specimen Description   Final  BLOOD LEFT WRIST Performed at [facility] [address], [place], [place] [address]    Special Requests   Final    BOTTLES DRAWN AEROBIC ONLY Blood Culture results may not be optimal due to an inadequate volume of blood received in culture bottles Performed at [facility] [address], [place], [place] [address]    Culture   Final    NO GROWTH 2 DAYS Performed at [facility] [address] [place], [place] [address]    Report Status PENDING  Incomplete  MRSA Next Gen by PCR, Nasal     Status: None   Collection Time: 11/26/20  3:41 AM   Specimen: Nasal Mucosa; Nasal Swab  Result Value Ref Range Status   MRSA by PCR Next Gen NOT DETECTED NOT DETECTED Final    Comment: (NOTE) The GeneXpert MRSA Assay (FDA approved for NASAL specimens only), is one component of a comprehensive MRSA colonization surveillance program. It is not intended to diagnose MRSA infection nor to guide or monitor treatment for MRSA infections. Test performance is not FDA approved in patients less than 71 years old. Performed at [facility], [place] [address], [place], [place] [address]     RN Pressure Injury Documentation: Pressure Injury 10/24/20 Heel Right;Medial Unstageable - Full thickness tissue loss in which the base of the injury is covered by slough (yellow, tan, gray, green or brown) and/or eschar (tan, brown or black) in the wound bed. Ninfa Linden (Active)  10/24/20 1500  Location: Heel  Location Orientation: Right;Medial  Staging: Unstageable - Full thickness tissue loss in which the base of the injury is covered by slough  (yellow, tan, gray, green or brown) and/or eschar (tan, brown or black) in the wound bed.  Wound Description (Comments): Ninfa Linden  Present on Admission: Yes     Pressure Injury 11/26/20 Sacrum Mid;Medial Stage 2 -  Partial thickness loss of dermis presenting as a shallow open injury with a red, pink wound bed without slough. pink (Active)  11/26/20 0152  Location: Sacrum  Location Orientation: Mid;Medial  Staging: Stage 2 -  Partial thickness loss of dermis presenting as a shallow open injury with a red, pink wound bed without slough.  Wound Description (Comments): pink  Present on Admission: Yes     Pressure Injury 11/26/20 Heel Right Unstageable - Full thickness tissue loss in which the base of the injury is covered by slough (yellow, tan, gray, green or brown) and/or eschar (tan, brown or black) in the wound bed. Black (Active)  11/26/20   Location: Heel  Location Orientation: Right  Staging: Unstageable - Full thickness tissue loss in which the base of the injury is covered by slough (yellow, tan, gray, green or brown) and/or eschar (tan, brown or black) in the wound bed.  Wound Description (Comments): Black  Present on Admission: Yes     Pressure Injury 11/26/20 Foot Left;Lateral Unstageable - Full thickness tissue loss in which the base of the injury is covered by slough (yellow, tan, gray, green or brown) and/or eschar (tan, brown or black) in the wound bed. (Active)  11/26/20 0155  Location: Foot  Location Orientation: Left;Lateral  Staging: Unstageable - Full thickness tissue loss in which the base of the injury is covered by slough (yellow, tan, gray, green or brown) and/or eschar (tan, brown or black) in the wound bed.  Wound Description (Comments):   Present on Admission: Yes    Estimated body mass index  is 20.73 kg/m as calculated from the following:   Height as of this encounter: 5\' 3"  (1.6 m).   Weight as of this encounter: 53.1 kg.  Malnutrition Type:  Nutrition  Problem: Severe Malnutrition Etiology: chronic illness (extensive cardiac hx)  Malnutrition Characteristics:  Signs/Symptoms: moderate fat depletion, severe muscle depletion, percent weight loss Percent weight loss: 5.6 %  Nutrition Interventions:  Interventions: Ensure Enlive (each supplement provides 350kcal and 20 grams of protein), Boost Breeze   Radiology Studies: No results found.  Scheduled Meds:  artificial tears   Both Eyes QHS   aspirin EC  81 mg Oral Daily   atorvastatin  10 mg Oral QHS   azithromycin  500 mg Oral QHS   cycloSPORINE  1 drop Both Eyes BID   dorzolamide  1 drop Both Eyes BID   erythromycin  1 application Left Eye TID   feeding supplement  1 Container Oral BID BM   feeding supplement  237 mL Oral Q24H   furosemide  20 mg Intravenous Daily   gabapentin  300 mg Oral QHS   gatifloxacin  1 drop Both Eyes Daily   guaiFENesin  600 mg Oral BID   heparin  5,000 Units Subcutaneous Q8H   insulin aspart  0-5 Units Subcutaneous QHS   insulin aspart  0-9 Units Subcutaneous TID WC   mirtazapine  15 mg Oral QHS   multivitamin with minerals  1 tablet Oral Daily   nutrition supplement (JUVEN)  1 packet Oral BID BM   nystatin  5 mL Oral QID   pantoprazole  40 mg Oral Daily   prednisoLONE acetate  1 drop Right Eye Daily   predniSONE  10 mg Oral Q breakfast   Followed by   Derrill Memo ON 12/03/2020] predniSONE  10 mg Oral Q breakfast   pyridostigmine  60 mg Oral TID   timolol  1 drop Both Eyes BID   Continuous Infusions:  cefTRIAXone (ROCEPHIN)  IV 1 g (11/27/20 2305)    LOS: 2 days   Kerney Elbe, DO [facility] PAGER is on AMION  If 7PM-7AM, please contact night-coverage [email or web address]

## 2020-11-28 NOTE — Evaluation (Signed)
Physical Therapy Evaluation Patient Details Name: Terry Alexander MRN: [id number] DOB: 02-14-1940 Today's Date: 11/28/2020  History of Present Illness  80 yo female presents to [facility] from [facility] for AMS, heel wound, hypoglycemia. Chest x-ray with left lung base opacity and pleural effusion, concerning for pneumonia. PMH includes R breast cancer, DM, sjogren's syndrome, stomach cancer, CAP, NSTEMI, ptosis.  Clinical Impression   Pt presents with generalized debility, impaired activity tolerance, poor balance. Pt to benefit from acute PT to address deficits. Pt requiring mod +2 assist for bed-level mobility and EOB sitting, unable to progress to standing with max +2 due to weakness and pt stating she is "wore out". PT to progress mobility as tolerated, and will continue to follow acutely.         Recommendations for follow up therapy are one component of a multi-disciplinary discharge planning process, led by the attending physician.  Recommendations may be updated based on patient status, additional functional criteria and insurance authorization.  Follow Up Recommendations Skilled nursing-short term rehab (<3 hours/day)    Assistance Recommended at Discharge Frequent or constant Supervision/Assistance  Functional Status Assessment Patient has had a recent decline in their functional status and/or demonstrates limited ability to make significant improvements in function in a reasonable and predictable amount of time  Equipment Recommendations  None recommended by PT    Recommendations for Other Services       Precautions / Restrictions Precautions Precautions: Fall Precaution Comments: 2LO2 Restrictions Weight Bearing Restrictions: No      Mobility  Bed Mobility Overal bed mobility: Needs Assistance Bed Mobility: Supine to Sit;Sit to Supine     Supine to sit: Mod assist;+2 for physical assistance Sit to supine: +2 for physical assistance;Mod assist   General bed mobility  comments: mod +2 for trunk and LE management, scooting to/from EOB.    Transfers Overall transfer level: Needs assistance Equipment used: Rolling walker (2 wheels) Transfers: Sit to/from Stand Sit to Stand: Max assist;+2 physical assistance           General transfer comment: max +2 for attempt to power up, rise; unable to progress beyond power up on x3 attempts due to pt weakness    Ambulation/Gait                  Stairs            Wheelchair Mobility    Modified Rankin (Stroke Patients Only)       Balance Overall balance assessment: Needs assistance Sitting-balance support: No upper extremity supported;Feet supported Sitting balance-Leahy Scale: Fair       Standing balance-Leahy Scale: Zero                               Pertinent Vitals/Pain Pain Assessment: No/denies pain    Home Living Family/patient expects to be discharged to:: Skilled nursing facility                   Additional Comments: has been at [facility] about 2 weeks    Prior Function Prior Level of Function : Needs assist       Physical Assist : ADLs (physical)   ADLs (physical): Feeding;Grooming;Bathing;Dressing;Toileting Mobility Comments: pt reports walking with a RW at rehab, no falls in the last 6 months ADLs Comments: assist for all ADLs at SNF     Hand Dominance   Dominant Hand: Right    Extremity/Trunk Assessment   Upper Extremity  Assessment Upper Extremity Assessment: Defer to OT evaluation (bilat UE edema)    Lower Extremity Assessment Lower Extremity Assessment: Generalized weakness    Cervical / Trunk Assessment Cervical / Trunk Assessment: Normal  Communication   Communication: No difficulties  Cognition Arousal/Alertness: Awake/alert Behavior During Therapy: WFL for tasks assessed/performed Overall Cognitive Status: No family/caregiver present to determine baseline cognitive functioning                                  General Comments: follows commands, pleasant. Oriented to self, location, and year        General Comments      Exercises     Assessment/Plan    PT Assessment Patient needs continued PT services  PT Problem List Decreased strength;Decreased mobility;Decreased safety awareness;Decreased activity tolerance;Decreased balance;Decreased knowledge of use of DME;Decreased coordination;Cardiopulmonary status limiting activity;Decreased skin integrity       PT Treatment Interventions DME instruction;Therapeutic activities;Gait training;Therapeutic exercise;Patient/family education;Balance training;Functional mobility training;Neuromuscular re-education    PT Goals (Current goals can be found in the Care Plan section)  Acute Rehab PT Goals Patient Stated Goal: get better, rest PT Goal Formulation: With patient Time For Goal Achievement: 12/12/20 Potential to Achieve Goals: Good    Frequency Min 2X/week   Barriers to discharge        Co-evaluation               AM-PAC PT "6 Clicks" Mobility  Outcome Measure Help needed turning from your back to your side while in a flat bed without using bedrails?: A Lot Help needed moving from lying on your back to sitting on the side of a flat bed without using bedrails?: A Lot Help needed moving to and from a bed to a chair (including a wheelchair)?: Total Help needed standing up from a chair using your arms (e.g., wheelchair or bedside chair)?: Total Help needed to walk in hospital room?: Total Help needed climbing 3-5 steps with a railing? : Total 6 Click Score: 8    End of Session   Activity Tolerance: Patient limited by fatigue Patient left: in bed;with bed alarm set;with call bell/phone within reach;Other (comment) (R prevalon donned, L heel floated) Nurse Communication: Mobility status;Other (comment) (needs suction set up for secretions) PT Visit Diagnosis: Other abnormalities of gait and mobility (R26.89);Muscle weakness  (generalized) (M62.81);Difficulty in walking, not elsewhere classified (R26.2)    Time: 7793-9030 PT Time Calculation (min) (ACUTE ONLY): 21 min   Charges:   PT Evaluation $PT Eval Low Complexity: 1 Low          Orris Perin S, PT DPT Acute Rehabilitation Services Pager [phone]  Office [phone]   Roxine Caddy E Ruffin Pyo 11/28/2020, 3:35 PM

## 2020-11-28 NOTE — Plan of Care (Signed)
  Problem: Nutrition: Goal: Adequate nutrition will be maintained Outcome: Progressing   Problem: Elimination: Goal: Will not experience complications related to bowel motility Outcome: Progressing   Problem: Safety: Goal: Ability to remain free from injury will improve Outcome: Progressing   

## 2020-11-29 ENCOUNTER — Inpatient Hospital Stay (HOSPITAL_COMMUNITY): Payer: Medicare (Managed Care)

## 2020-11-29 DIAGNOSIS — J690 Pneumonitis due to inhalation of food and vomit: Secondary | ICD-10-CM

## 2020-11-29 LAB — CBC WITH DIFFERENTIAL/PLATELET
Abs Immature Granulocytes: 0.14 10*3/uL — ABNORMAL HIGH (ref 0.00–0.07)
Basophils Absolute: 0.1 10*3/uL (ref 0.0–0.1)
Basophils Relative: 0 %
Eosinophils Absolute: 0 10*3/uL (ref 0.0–0.5)
Eosinophils Relative: 0 %
HCT: 27.8 % — ABNORMAL LOW (ref 36.0–46.0)
Hemoglobin: 9.2 g/dL — ABNORMAL LOW (ref 12.0–15.0)
Immature Granulocytes: 1 %
Lymphocytes Relative: 4 %
Lymphs Abs: 0.9 10*3/uL (ref 0.7–4.0)
MCH: 32.7 pg (ref 26.0–34.0)
MCHC: 33.1 g/dL (ref 30.0–36.0)
MCV: 98.9 fL (ref 80.0–100.0)
Monocytes Absolute: 1.3 10*3/uL — ABNORMAL HIGH (ref 0.1–1.0)
Monocytes Relative: 7 %
Neutro Abs: 17.9 10*3/uL — ABNORMAL HIGH (ref 1.7–7.7)
Neutrophils Relative %: 88 %
Platelets: 186 10*3/uL (ref 150–400)
RBC: 2.81 MIL/uL — ABNORMAL LOW (ref 3.87–5.11)
RDW: 13.6 % (ref 11.5–15.5)
WBC: 20.3 10*3/uL — ABNORMAL HIGH (ref 4.0–10.5)
nRBC: 0 % (ref 0.0–0.2)

## 2020-11-29 LAB — GLUCOSE, CAPILLARY
Glucose-Capillary: 242 mg/dL — ABNORMAL HIGH (ref 70–99)
Glucose-Capillary: 167 mg/dL — ABNORMAL HIGH (ref 70–99)
Glucose-Capillary: 169 mg/dL — ABNORMAL HIGH (ref 70–99)
Glucose-Capillary: 276 mg/dL — ABNORMAL HIGH (ref 70–99)

## 2020-11-29 LAB — COMPREHENSIVE METABOLIC PANEL
ALT: 20 U/L (ref 0–44)
AST: 15 U/L (ref 15–41)
Albumin: 2.1 g/dL — ABNORMAL LOW (ref 3.5–5.0)
Alkaline Phosphatase: 116 U/L (ref 38–126)
Anion gap: 7 (ref 5–15)
BUN: 35 mg/dL — ABNORMAL HIGH (ref 8–23)
CO2: 25 mmol/L (ref 22–32)
Calcium: 8.8 mg/dL — ABNORMAL LOW (ref 8.9–10.3)
Chloride: 105 mmol/L (ref 98–111)
Creatinine, Ser: 1.13 mg/dL — ABNORMAL HIGH (ref 0.44–1.00)
GFR, Estimated: 49 mL/min — ABNORMAL LOW (ref >60)
Glucose, Bld: 298 mg/dL — ABNORMAL HIGH (ref 70–99)
Potassium: 3.6 mmol/L (ref 3.5–5.1)
Sodium: 137 mmol/L (ref 135–145)
Total Bilirubin: 0.5 mg/dL (ref 0.3–1.2)
Total Protein: 5.3 g/dL — ABNORMAL LOW (ref 6.5–8.1)

## 2020-11-29 LAB — RESP PANEL BY RT-PCR (FLU A&B, COVID) ARPGX2
Influenza A by PCR: NEGATIVE
Influenza B by PCR: NEGATIVE
SARS Coronavirus 2 by RT PCR: NEGATIVE

## 2020-11-29 LAB — PHOSPHORUS: Phosphorus: 1.6 mg/dL — ABNORMAL LOW (ref 2.5–4.6)

## 2020-11-29 LAB — MAGNESIUM: Magnesium: 1.7 mg/dL (ref 1.7–2.4)

## 2020-11-29 MED ORDER — AMOXICILLIN-POT CLAVULANATE 875-125 MG PO TABS
1.0000 | ORAL_TABLET | Freq: Two times a day (BID) | ORAL | Status: DC
  Administered 2020-11-29 – 2020-11-30 (×2): 1 via ORAL
  Filled 2020-11-29 (×3): qty 1

## 2020-11-29 MED ORDER — MAGNESIUM SULFATE 2 GM/50ML IV SOLN
2.0000 g | Freq: Once | INTRAVENOUS | Status: AC
  Administered 2020-11-29: 10:00:00 2 g via INTRAVENOUS
  Filled 2020-11-29: qty 50

## 2020-11-29 MED ORDER — POTASSIUM PHOSPHATES 15 MMOLE/5ML IV SOLN
20.0000 mmol | Freq: Once | INTRAVENOUS | Status: AC
  Administered 2020-11-29: 10:00:00 20 mmol via INTRAVENOUS
  Filled 2020-11-29: qty 6.67

## 2020-11-29 NOTE — Progress Notes (Signed)
11/28/20 0801  Pain Assessment  Pain Assessment No/denies pain  General Information  HPI 80 yo female who has been at rehab, admitted to [facility] with AMS hyperglycemia CBGs in the 400s - diagnosed with CAP-  CXR showed patchy airspace disease in the perihilar region on the left and consolidation at the L lung base with small L pleural effusion. Pt with PMH + for Sjogren's disease, CAD, HLD, HTN, type 2 DM, stage 2-3 CKD, breast cancer s/p masectomy in 1977, stomach cancer, malnutrition, CAS with NSTEMI in 10/2020 and cardiomyopathy.  Pt with 55 pound weight loss.  Type of Study MBS-Modified Barium Swallow Study  Diet Prior to this Study Regular;Nectar-thick liquids  Temperature Spikes Noted No  Respiratory Status Nasal cannula  History of Recent Intubation No  Behavior/Cognition Alert;Cooperative;Pleasant mood  Oral Cavity Assessment WFL  Oral Care Completed by SLP No  Oral Cavity - Dentition Dentures, top;Dentures, bottom  Self-Feeding Abilities Needs assist  Patient Positioning Upright in chair  Baseline Vocal Quality Wet  Volitional Cough Weak  Volitional Swallow Able to elicit  [facility]  Pharyngeal Secretions Standing secretions in (comment) (standing secretions throughout)    11/28/20 0850  Oral Preparation/Oral Phase  Oral Phase Impaired  Oral - Nectar  Oral - Nectar Cup Piecemeal swallowing  Oral - Nectar Straw Piecemeal swallowing  Oral - Thin  Oral - Thin Cup Piecemeal swallowing  Oral - Thin Straw Piecemeal swallowing  Oral - Solids  Oral - Puree Piecemeal swallowing;Lingual/palatal residue;Weak lingual manipulation  Oral - Mech Soft Weak lingual manipulation;Lingual/palatal residue;Impaired mastication     11/28/20 0851  Pharyngeal Phase  Pharyngeal Phase Impaired  Pharyngeal - Nectar  Pharyngeal- Nectar Cup Pharyngeal residue - pyriform;Reduced airway/laryngeal closure;Reduced laryngeal elevation;Reduced tongue base retraction;Reduced pharyngeal  peristalsis;Reduced epiglottic inversion  Pharyngeal Material enters airway, remains ABOVE vocal cords and not ejected out  Pharyngeal- Nectar Straw Pharyngeal residue - pyriform;Penetration/Apiration after swallow;Reduced anterior laryngeal mobility;Reduced laryngeal elevation;Reduced airway/laryngeal closure  Pharyngeal Material enters airway, remains ABOVE vocal cords and not ejected out  Pharyngeal - Thin  Pharyngeal- Thin Cup Pharyngeal residue - pyriform;Reduced epiglottic inversion;Reduced pharyngeal peristalsis;Reduced anterior laryngeal mobility;Reduced laryngeal elevation;Reduced airway/laryngeal closure;Reduced tongue base retraction;Pharyngeal residue - valleculae;Penetration/Aspiration before swallow;Penetration/Aspiration during swallow;Penetration/Apiration after swallow  Pharyngeal- Thin Straw Pharyngeal residue - pyriform;Delayed swallow initiation-pyriform sinuses;Reduced laryngeal elevation;Reduced airway/laryngeal closure;Reduced anterior laryngeal mobility;Reduced tongue base retraction;Reduced epiglottic inversion;Pharyngeal residue - valleculae  Pharyngeal Material enters airway, passes BELOW cords without attempt by patient to eject out (silent aspiration)  Pharyngeal - Solids  Pharyngeal- Puree Pharyngeal residue - posterior pharnyx;Other (Comment);Reduced pharyngeal peristalsis;Reduced epiglottic inversion;Reduced tongue base retraction;Pharyngeal residue - valleculae  Pharyngeal Material does not enter airway  Pharyngeal- Mechanical Soft Reduced pharyngeal peristalsis;Reduced epiglottic inversion;Pharyngeal residue - valleculae  Pharyngeal Material does not enter airway  Pharyngeal Phase - Comment  Pharyngeal Comment pt conducts extended breath hold with swallows *up to 5 swallows with single bolus* to clear material from pharynx, she also conducts piecemeailng as compensation, penetration of nectar after swallow due to pyriform retention spilling into open larynx, head turn  left (pt leaning left) was helpful to decrease accumulation of boluses in pharynx; swallow was inconsistent in presentation - modified likely by bolus size and pt effort     11/28/20 0917  Clinical Impression  Clinical Impression Patient presents with 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  Testing was limited by pt's shoulders blocking UES/CP despite efforts to offset.  Impaired oral coordination noted resulting in premature spillage of liquids to pyriform  and prolonged mastication and oral retention with solids. Pharyngeal swallow marked by impaired laryngeal elevation/closure, poor pharyngeal contraction allowing retention that mixed with secretions. Swallow was inconsistent in presentation, likely modulated by her effort and bolus size.   Pt conducts breath hold with up to five sequential swallows to clear a single bolus of thin liquids - causing SLP to suspect chronic nature of dysphagia that she has managed with self-taught compensations.   She does penetrate and aspirate with thin without awareness - protective cough response.  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  Chin tuck worsened airway protection as thin with secretions infiltrated larynx post-swallow.  Head turn left *pt leaning left* did decrease pharyngeal retention.  Wet vocal quality observed at baseline and after MBS, indicative of secretion aspiration. Pt endorses wet voice for several months.  Recommend dys3/nectar and allow thin water between meals with head turn left, multiple swallows.  Follow up with SLP at SNF indicated for dysphagia management.  SLP Visit Diagnosis Dysphagia, oropharyngeal phase (R13.12);Dysphagia, pharyngoesophageal phase (R13.14)  Impact on safety and function Moderate aspiration risk;Risk for inadequate nutrition/hydration  Swallow Evaluation Recommendations  SLP Diet Recommendations Dysphagia 3 (Mech soft)  solids;Nectar thick liquid (thin water between meals)  Liquid Administration via Cup;Straw  Medication Administration Crushed with puree  Supervision Staff to assist with self feeding;Full supervision/cueing for compensatory strategies  Compensations Slow rate;Small sips/bites;Follow solids with liquid (head turn left, intermitent cough/throat clear and re-swallow)  Postural Changes Remain semi-upright after after feeds/meals (Comment);Seated upright at 90 degrees  Treatment Plan  Oral Care Recommendations Oral care QID  Assistance recommended at discharge Frequent or constant Supervision/Assistance  Functional Status Assessment Patient has had a recent decline in their functional status and demonstrates the ability to make significant improvements in function in a reasonable and predictable amount of time.  Speech Therapy Frequency (ACUTE ONLY) min 1 x/week  Treatment Duration 1 week  Interventions Aspiration precaution training;Compensatory techniques;Patient/family education     11/28/20 0919  Prognosis  Prognosis for Safe Diet Advancement Good  Barriers to Reach Goals Time post onset  Individuals Consulted  Consulted and Agree with Results and Recommendations Patient  Report Sent to  Referring physician  Progression Toward Goals  Progression toward goals Progressing toward goals  SLP Time Calculation  SLP Start Time (ACUTE ONLY) 0815  SLP Stop Time (ACUTE ONLY) 0845  SLP Time Calculation (min) (ACUTE ONLY) 30 min  SLP Evaluations  $ SLP Speech Visit 1 Visit  SLP Evaluations  $MBS Swallow 1 Procedure  $Swallowing Treatment 1 Procedure    Kathleen Lime, MS [facility] SLP Acute Rehab Services Office [phone] Pager [phone]

## 2020-11-29 NOTE — Progress Notes (Signed)
Speech Language Pathology Treatment: Dysphagia  Patient Details Name: Terry Alexander MRN: [id number] DOB: 10/08/1940 Today's Date: 11/29/2020 Time: 3893-7342 SLP Time Calculation (min) (ACUTE ONLY): 38 min  Assessment / Plan / Recommendation Clinical Impression  Pt seen to address dysphagia goals - Pt asleep but woke with stimulation.  SLP set up oral suction as pt drooling from her right side upon entrance to room.  After oral suctioning and helping pt with lower denture - pt willing to consume po intake.  Clinical observed with nectar thick juice, applesauce and graham crackers completed. NO indication of aspiration noted but pt with silent aspiration and retention on her MBS.  Minimal oral retention of cracker noted post swallow - even after liquid swallow - thus orally suctioned pt to clear.    Showed pt her MBS study on a WOW- explaining and clinical reasoning for head turn left with swallowing to decrease amount of pharyngeal retention.   Pt advised it is easier to swallow with head turn but uncertain to her ability to compehend and sensory deficits may prohibit her comprehension.    Pt requires total cues at this time to use head turn posture and thus will need full supervision with all po.   SLP Informed RN of MBS findings from  11/28/2020 and need for pt to turn head head left with swallowing. As this strategy has not been followed despite sign placed  in room, SlP wrote separate sign requesting strategy.   Pt's voice continues to be wet/gurgly on occasion and cued cough/throat clear does not fully clear it.  Secretion aspiration ongoing  and dysphagia/aspiration mitigation in place. Will follow up.    HPI HPI: 80 yo female who has been at rehab, admitted to [facility] with AMS hyperglycemia CBGs in the 400s - diagnosed with CAP-  CXR showed patchy airspace disease in the perihilar region on the left and consolidation at the L lung base with small L pleural effusion. Pt with PMH + for Sjogren's  disease, CAD, HLD, HTN, type 2 DM, stage 2-3 CKD, breast cancer s/p masectomy in 1977, stomach cancer, malnutrition, CAS with NSTEMI in 10/2020 and cardiomyopathy.  Pt with 55 pound weight loss.      SLP Plan  Continue with current plan of care      Recommendations for follow up therapy are one component of a multi-disciplinary discharge planning process, led by the attending physician.  Recommendations may be updated based on patient status, additional functional criteria and insurance authorization.    Recommendations  Diet recommendations: Dysphagia 3 (mechanical soft);Nectar-thick liquid (thin water between meals) Liquids provided via: Straw;Cup Medication Administration: Whole meds with puree Supervision: Full supervision/cueing for compensatory strategies Compensations: Slow rate;Small sips/bites;Follow solids with liquid (head turn left) Postural Changes and/or Swallow Maneuvers: Upright 30-60 min after meal;Seated upright 90 degrees;Head tilt left during swallow                Oral Care Recommendations: Oral care before and after PO Follow Up Recommendations: Skilled nursing-short term rehab (<3 hours/day) Assistance recommended at discharge: Frequent or constant Supervision/Assistance SLP Visit Diagnosis: Dysphagia, oropharyngeal phase (R13.12);Dysphagia, pharyngoesophageal phase (R13.14) Plan: Continue with current plan of care       GO              Kathleen Lime, MS [place] Pager [phone]   Luanna Salk Ann.slp   11/29/2020, 1:52 PM

## 2020-11-29 NOTE — Progress Notes (Signed)
PROGRESS NOTE    Terry Alexander  ZSW:[id number] DOB: [birth year] DOA: 11/25/2020 PCP: Janifer Adie, MD   Brief Narrative:  The patient is an 80 year old African-American female with a past medical history significant for but not limited to hypertension, diabetes mellitus type 2, Sjogren's syndrome multiple other comorbidities who is currently a skilled nursing facility resident who was admitted to hospital with altered mental status, dehydration and pneumonia.  She is started on antibiotics.  There is concern for aspiration she was seen by speech therapy.  She was given IV fluids which is improved her dehydration but now has been started back on Diuretics.  SLP evaluated and recommending dysphagia 3 diet with nectar thick liquids.  Nutrition is also seen given her severe malnutrition in the context of chronic illness and made some recommendations.  Repeat CXR this AM showed "Stable cardiomegaly. Hazy opacities in the left hilar region and left lung base concerning for airspace disease. Follow-up examination with AP/lateral view would be helpful."  Understands the patient IV Unasyn given [place] for aspiration  Assessment & Plan:   Principal Problem:   CAP (community acquired pneumonia) Active Problems:   Diabetes mellitus ([place])   Sepsis ([place])   AKI (acute kidney injury) ([place])   Acute metabolic encephalopathy   Elevated troponin   Pressure injury of skin   Pneumonia, present on admission likely community-acquired versus aspiration more consistent with aspiration -CXR on 11/25/20 showed "Cardiomegaly with mildly distended pulmonary vasculature. Patchy airspace disease in the left lung with consolidation at the left lung base. Small left pleural effusion. -Continue antibiotics with azithromycin and ceftriaxone but will change to IV Unasyn today and likely discharge on p.o. Augmentin -C/w Guaifenesin 600 mg po BID and increase to 1200 mg po BID -Add Flutter Valve and Incentive Spirometry   -WBC went from 11.9 -> 21.8 -> 22.3 -> 20.3; she is on prednisone and could be steroid margination given that she got 2 doses of 40 mg -Add Xopenex and Atrovent q6hprn  -SLP consulted and recommending  Dysphagia 3 Diet with Nectar Thick Liquids with eating with her neck position to the left -Will need to repeat CXR in the AM   AKI on CKD stage IIIa -Her AKI was likely in the setting of ARB and diuretic use -Creatinine was 1.6 on admission -Improved with IVF and now back on Diuretics with Furosemide 20 mg IV Daily  -Patient's BUN to creatinine is improved to 33/1.17 -> 34/1.18 and is further improved to 35/1.13 today -Further nephrotoxic medications, contrast dyes, hypotension and dehydration and renally dose medications -Repeat CMP in a.m.  Acute metabolic encephalopathy, improved -In the setting of infection and dehydration -Overall mental status appears to be improving and likely approaching baseline and is much improved today than it was yesterday -Last hospitalization she did have a episode of hospital delirium -We will place on delirium precautions  Chronic hypoxic respiratory failure -Chronically is on 3 L supplemental oxygen -SpO2: 100 % O2 Flow Rate (L/min): 3 L/min -Currently her supplemental oxygen requirement appears to be baseline -Repeat chest x-ray in the a.m.  Chronic combined CHF -Echo showed an EF of 45 to 50% and grade 1 diastolic dysfunction -Currently appears compensated -Strict I's and O's and daily weights -Patient is +685 mL since admission -She was restarted on Lasix given that she appeared that started developing anasarca and is now on IV Lasix 20 mg p.o. daily and will need to be changed her home dose in the morning  Diabetes mellitus  type 2 -Chronically on linagliptin as well as NovoLog -Continue sliding scale insulin with Sensitive Novolog SSI AC -Continue to monitor blood sugars per protocol; CBGs have been ranging from  Sjoren's syndrome with  Keratoconjunctivitis -She is followed by ophthalmology and rheumatology in outpatient setting -She is on prednisone which we will continue with 10 mg po Daily  x5 doses followed by Prednisone 10 mg po Daily x 7 doses -C/w Prednisolone 1 drop in Right Eye Daily and Timolol 1 drop both Eyes BID as well as Gatifloxacin 1 drop in both eyes Daily, Erythromycin Ophthalmic Ointment 1 application in Left Ey TID, Dorzolamide 1 drop in both eyes BID, and Cyclosoporine 1 drop in both eyes BID and Aritificial Tears  both eyes qHS -C/w Pyridostigmine 60 mg po TID  -SNF records were reviewed and prednisone taper was adjusted  Leukocytosis -Likely related to pneumonia versus prednisone usage given that she is chronically on prednisone -WBC went from 21.8 and trended up to 22.3 but is now trending back down to 20.3 -Change Abx to Unasyn and then Augmentin for D/C  -Continue to monitor for signs and symptoms of infection -Repeat CBC in a.m.  HLD -C/w Atorvastatin 10 mg po qHS  Oral Thrush -She was initiated on Nystatin 500,000 units 4 times daily solution  Severe Malnutrition in the Context of Chronic illness -Consulted dietitian for further evaluation and recommendations -SLP recommended dysphagia 3 diet with nectar thick liquids -The dietitian has been consulted recommending decreasing her Trileptal from twice daily to once a day and recommending ordering boost breeze p.o. twice daily as well as 1 packet of Juven twice daily and ordering 1 tablet of multivitamin with minerals daily and recommending liberalizing diet from a heart healthy carb modified regular diet given her severe malnutrition and wounds  DVT prophylaxis: Heparin 5,000 units  Code Status: DO NOT RESUSCITATE  Family Communication: No family present at bedside  Disposition Plan: SNF once medically stable and ready likely this will be in the next 24 hours  Status is: Inpatient  Remains inpatient appropriate because: She continues to get  diuresed and get her nonmonitored bed anticipating discharge in next 24 to 48 hours   Consultants:  None  Procedures: None  Antimicrobials:  Anti-infectives (From admission, onward)    Start     Dose/Rate Route Frequency Ordered Stop   11/26/20 2300  azithromycin (ZITHROMAX) 500 mg in sodium chloride 0.9 % 250 mL IVPB  Status:  Discontinued        500 mg 250 mL/hr over 60 Minutes Intravenous Every 24 hours 11/26/20 0108 11/26/20 1101   11/26/20 2300  cefTRIAXone (ROCEPHIN) 1 g in sodium chloride 0.9 % 100 mL IVPB        1 g 200 mL/hr over 30 Minutes Intravenous Every 24 hours 11/26/20 0108     11/26/20 2200  azithromycin (ZITHROMAX) tablet 500 mg        500 mg Oral Daily at bedtime 11/26/20 1101     11/25/20 2315  cefTRIAXone (ROCEPHIN) 1 g in sodium chloride 0.9 % 100 mL IVPB        1 g 200 mL/hr over 30 Minutes Intravenous  Once 11/25/20 2306 11/25/20 2353   11/25/20 2315  azithromycin (ZITHROMAX) 500 mg in sodium chloride 0.9 % 250 mL IVPB        500 mg 250 mL/hr over 60 Minutes Intravenous  Once 11/25/20 2306 11/26/20 0039        Subjective: Seen and examined at bedside and  she is much more awake and alert today.  Felt okay.  She denies any current pain and no nausea or vomiting.  No lightheadedness or dizziness.  Continues to wear 3 L of supplemental oxygen via nasal cannula.  Worked with speech therapy who recommends continuing current plan of care with dysphagia 3 diet with nectar thick liquids.  She is clinically improving and anticipating discharging back to SNF in the morning on p.o. Augmentin.  Objective: Vitals:   11/28/20 1325 11/28/20 2048 11/29/20 0509 11/29/20 1237  BP: 120/72 (!) 149/77 127/61 117/63  Pulse: 85 (!) 103 97 73  Resp: 14 17 17 16   Temp: 98 F (36.7 C) 98.4 F (36.9 C) 98 F (36.7 C)   TempSrc: Oral Oral Oral   SpO2: 100% 100% 100% 100%  Weight:      Height:        Intake/Output Summary (Last 24 hours) at 11/29/2020 1516 Last data filed  at 11/29/2020 4132 Gross per 24 hour  Intake 120 ml  Output 1150 ml  Net -1030 ml    Filed Weights   11/25/20 2204  Weight: 53.1 kg   Examination: Physical Exam:  Constitutional: Chronically ill-appearing elderly African-American female currently in no acute distress who is much more awake and alert Eyes: Lids are normal. ENMT: External Ears, Nose appear normal. Grossly normal hearing.  Neck: Appears normal, supple, no cervical masses, normal ROM, no appreciable thyromegaly; no appreciable JVD Respiratory: Diminished to auscultation bilaterally with coarse breath sounds and some slight rhonchi on the left, no wheezing, rales, or crackles. Normal respiratory effort and patient is not tachypenic. No accessory muscle use.  Unlabored breathing but is wearing 3 L of supplemental oxygen via nasal cannula Cardiovascular: RRR, no murmurs / rubs / gallops. S1 and S2 auscultated.  Minimal extremity edema noted Abdomen: Soft, non-tender, non-distended. Bowel sounds positive.  GU: Deferred. Musculoskeletal: No clubbing / cyanosis of digits/nails. No joint deformity upper and lower extremities.  But her right leg was in a Prevalon boot Skin: No rashes, lesions, ulcers. No induration; Warm and dry.  Neurologic: CN 2-12 grossly intact with no focal deficits. Romberg sign and cerebellar reflexes not assessed.  Psychiatric: Mildly  impaired judgment and insight.  She is awake and alert and oriented x 3. Normal mood and appropriate affect.   Data Reviewed: I have personally reviewed following labs and imaging studies  CBC: Recent Labs  Lab 11/25/20 2155 11/26/20 0228 11/27/20 2019 11/28/20 0440 11/29/20 0432  WBC 10.4 11.9* 21.8* 22.3* 20.3*  NEUTROABS 9.2*  --   --   --  17.9*  HGB 9.2* 9.9* 9.8* 9.2* 9.2*  HCT 28.2* 30.0* 30.9* 28.1* 27.8*  MCV 99.3 98.7 102.3* 100.0 98.9  PLT 189 191 160 197 440    Basic Metabolic Panel: Recent Labs  Lab 11/25/20 2155 11/26/20 0228 11/27/20 2019  11/28/20 0440 11/29/20 0432  NA 135 136 139 138 137  K 3.2* 4.0 5.0 4.3 3.6  CL 100 101 107 106 105  CO2 24 24 23 23 25   GLUCOSE 191* 115* 277* 226* 298*  BUN 41* 27* 33* 34* 35*  CREATININE 1.66* 1.49* 1.17* 1.18* 1.13*  CALCIUM 8.7* 8.5* 8.7* 8.8* 8.8*  MG  --  1.7 1.7  --  1.7  PHOS  --   --   --  2.1* 1.6*    GFR: Estimated Creatinine Clearance: 31.8 mL/min (A) (by C-G formula based on SCr of 1.13 mg/dL (H)). Liver Function Tests: Recent Labs  Lab 11/25/20 2155 11/27/20 2019 11/28/20 0440 11/29/20 0432  AST 22 25  --  15  ALT 22 24  --  20  ALKPHOS 88 125  --  116  BILITOT 0.7 0.6  --  0.5  PROT 5.3* 5.5*  --  5.3*  ALBUMIN 2.1* 2.3* 2.1* 2.1*    No results for input(s): LIPASE, AMYLASE in the last 168 hours. No results for input(s): AMMONIA in the last 168 hours. Coagulation Profile: No results for input(s): INR, PROTIME in the last 168 hours. Cardiac Enzymes: No results for input(s): CKTOTAL, CKMB, CKMBINDEX, TROPONINI in the last 168 hours. BNP (last 3 results) No results for input(s): PROBNP in the last 8760 hours. HbA1C: No results for input(s): HGBA1C in the last 72 hours. CBG: Recent Labs  Lab 11/28/20 1135 11/28/20 1623 11/28/20 2041 11/29/20 0756 11/29/20 1231  GLUCAP 212* 220* 343* 242* 167*    Lipid Profile: No results for input(s): CHOL, HDL, LDLCALC, TRIG, CHOLHDL, LDLDIRECT in the last 72 hours. Thyroid Function Tests: No results for input(s): TSH, T4TOTAL, FREET4, T3FREE, THYROIDAB in the last 72 hours. Anemia Panel: No results for input(s): VITAMINB12, FOLATE, FERRITIN, TIBC, IRON, RETICCTPCT in the last 72 hours. Sepsis Labs: Recent Labs  Lab 11/25/20 2155 11/26/20 0018 11/26/20 0228  LATICACIDVEN 5.3* 5.1* 4.6*     Recent Results (from the past 240 hour(s))  Culture, blood (routine x 2)     Status: None (Preliminary result)   Collection Time: 11/25/20  9:55 PM   Specimen: BLOOD  Result Value Ref Range Status   Specimen  Description   Final    BLOOD LEFT ARM Performed at [facility] [address], [place], [place] [address]    Special Requests   Final    BOTTLES DRAWN AEROBIC ONLY Blood Culture adequate volume Performed at [facility] [address], [place], [place] [address]    Culture   Final    NO GROWTH 2 DAYS Performed at [facility] [address] [place], [place] [address]    Report Status PENDING  Incomplete  Resp Panel by RT-PCR (Flu A&B, Covid) Nasopharyngeal Swab     Status: None   Collection Time: 11/25/20 11:23 PM   Specimen: Nasopharyngeal Swab; Nasopharyngeal(NP) swabs in vial transport medium  Result Value Ref Range Status   SARS Coronavirus 2 by RT PCR NEGATIVE NEGATIVE Final    Comment: (NOTE) SARS-CoV-2 target nucleic acids are NOT DETECTED.  The SARS-CoV-2 RNA is generally detectable in upper respiratory specimens during the acute phase of infection. The lowest concentration of SARS-CoV-2 viral copies this assay can detect is 138 copies/mL. A negative result does not preclude SARS-Cov-2 infection and should not be used as the sole basis for treatment or other patient management decisions. A negative result may occur with  improper specimen collection/handling, submission of specimen other than nasopharyngeal swab, presence of viral mutation(s) within the areas targeted by this assay, and inadequate number of viral copies(<138 copies/mL). A negative result must be combined with clinical observations, patient history, and epidemiological information. The expected result is Negative.  Fact Sheet for Patients:  EntrepreneurPulse.com.au  Fact Sheet for Healthcare Providers:  IncredibleEmployment.be  This test is no t yet approved or cleared by the [place] FDA and  has been authorized for detection and/or diagnosis of SARS-CoV-2 by FDA under an Emergency Use Authorization (EUA). This  EUA will remain  in effect (meaning this test can be used) for the duration  of the COVID-19 declaration under Section 564(b)(1) of the Act, 21 U.S.C.section 360bbb-3(b)(1), unless the authorization is terminated  or revoked sooner.       Influenza A by PCR NEGATIVE NEGATIVE Final   Influenza B by PCR NEGATIVE NEGATIVE Final    Comment: (NOTE) The Xpert Xpress SARS-CoV-2/FLU/RSV plus assay is intended as an aid in the diagnosis of influenza from Nasopharyngeal swab specimens and should not be used as a sole basis for treatment. Nasal washings and aspirates are unacceptable for Xpert Xpress SARS-CoV-2/FLU/RSV testing.  Fact Sheet for Patients: EntrepreneurPulse.com.au  Fact Sheet for Healthcare Providers: IncredibleEmployment.be  This test is not yet approved or cleared by the [place] FDA and has been authorized for detection and/or diagnosis of SARS-CoV-2 by FDA under an Emergency Use Authorization (EUA). This EUA will remain in effect (meaning this test can be used) for the duration of the COVID-19 declaration under Section 564(b)(1) of the Act, 21 U.S.C. section 360bbb-3(b)(1), unless the authorization is terminated or revoked.  Performed at [facility], [place] [address] [place], [place] [address]   Culture, blood (routine x 2)     Status: None (Preliminary result)   Collection Time: 11/26/20  2:28 AM   Specimen: BLOOD  Result Value Ref Range Status   Specimen Description   Final    BLOOD LEFT WRIST Performed at [facility] [address], [place], [place] [address]    Special Requests   Final    BOTTLES DRAWN AEROBIC ONLY Blood Culture results may not be optimal due to an inadequate volume of blood received in culture bottles Performed at [facility] [address], [place], [place] [address]    Culture   Final    NO GROWTH 2 DAYS Performed at [facility] [address] [place], [place] [address]    Report Status PENDING  Incomplete  MRSA Next Gen by PCR, Nasal     Status: None   Collection Time: 11/26/20  3:41 AM   Specimen: Nasal Mucosa; Nasal Swab  Result Value Ref Range Status   MRSA by PCR Next Gen NOT DETECTED NOT DETECTED Final    Comment: (NOTE) The GeneXpert MRSA Assay (FDA approved for NASAL specimens only), is one component of a comprehensive MRSA colonization surveillance program. It is not intended to diagnose MRSA infection nor to guide or monitor treatment for MRSA infections. Test performance is not FDA approved in patients less than 21 years old. Performed at [facility], [place] [address], [place], [place] [address]      RN Pressure Injury Documentation: Pressure Injury 10/24/20 Heel Right;Medial Unstageable - Full thickness tissue loss in which the base of the injury is covered by slough (yellow, tan, gray, green or brown) and/or eschar (tan, brown or black) in the wound bed. Ninfa Linden (Active)  10/24/20 1500  Location: Heel  Location Orientation: Right;Medial  Staging: Unstageable - Full thickness tissue loss in which the base of the injury is covered by slough (yellow, tan, gray, green or brown) and/or eschar (tan, brown or black) in the wound bed.  Wound Description (Comments): Ninfa Linden  Present on Admission: Yes     Pressure Injury 11/26/20 Sacrum Mid;Medial Stage 2 -  Partial thickness loss of dermis presenting as a shallow open injury with a red, pink wound bed without slough. pink (Active)  11/26/20 0152  Location: Sacrum  Location Orientation: Mid;Medial  Staging: Stage 2 -  Partial thickness loss  of dermis presenting as a shallow open injury with a red, pink wound bed without slough.  Wound Description (Comments): pink  Present on Admission: Yes     Pressure Injury 11/26/20 Heel Right Unstageable - Full thickness tissue loss in which the base of the injury is covered by slough  (yellow, tan, gray, green or brown) and/or eschar (tan, brown or black) in the wound bed. Black (Active)  11/26/20   Location: Heel  Location Orientation: Right  Staging: Unstageable - Full thickness tissue loss in which the base of the injury is covered by slough (yellow, tan, gray, green or brown) and/or eschar (tan, brown or black) in the wound bed.  Wound Description (Comments): Black  Present on Admission: Yes     Pressure Injury 11/26/20 Foot Left;Lateral Unstageable - Full thickness tissue loss in which the base of the injury is covered by slough (yellow, tan, gray, green or brown) and/or eschar (tan, brown or black) in the wound bed. (Active)  11/26/20 0155  Location: Foot  Location Orientation: Left;Lateral  Staging: Unstageable - Full thickness tissue loss in which the base of the injury is covered by slough (yellow, tan, gray, green or brown) and/or eschar (tan, brown or black) in the wound bed.  Wound Description (Comments):   Present on Admission: Yes    Estimated body mass index is 20.73 kg/m as calculated from the following:   Height as of this encounter: 5\' 3"  (1.6 m).   Weight as of this encounter: 53.1 kg.  Malnutrition Type:  Nutrition Problem: Severe Malnutrition Etiology: chronic illness (extensive cardiac hx)  Malnutrition Characteristics:  Signs/Symptoms: moderate fat depletion, severe muscle depletion, percent weight loss Percent weight loss: 5.6 %  Nutrition Interventions:  Interventions: Ensure Enlive (each supplement provides 350kcal and 20 grams of protein), Boost Breeze   Radiology Studies: DG CHEST PORT 1 VIEW  Result Date: 11/29/2020 CLINICAL DATA:  Shortness of breath EXAM: PORTABLE CHEST 1 VIEW COMPARISON:  Chest radiograph dated November 25, 2020 FINDINGS: Stable cardiomegaly. Left hilar hazy opacity and left basilar atelectasis/opacification are unchanged concerning for airspace disease. IMPRESSION: 1.  Stable cardiomegaly. 2. Hazy opacities  in the left hilar region and left lung base concerning for airspace disease. Follow-up examination with AP/lateral view would be helpful. Electronically Signed   By: Keane Police D.O.   On: 11/29/2020 08:16   DG Swallowing Func-Speech Pathology  Result Date: 11/29/2020 Table formatting from the original result was not included.  11/28/20 0801 Pain Assessment Pain Assessment No/denies pain General Information HPI 80 yo female who has been at rehab, admitted to [facility] with AMS hyperglycemia CBGs in the 400s - diagnosed with CAP-  CXR showed patchy airspace disease in the perihilar region on the left and consolidation at the L lung base with small L pleural effusion. Pt with PMH + for Sjogren's disease, CAD, HLD, HTN, type 2 DM, stage 2-3 CKD, breast cancer s/p masectomy in 1977, stomach cancer, malnutrition, CAS with NSTEMI in 10/2020 and cardiomyopathy.  Pt with 55 pound weight loss. Type of Study MBS-Modified Barium Swallow Study Diet Prior to this Study Regular;Nectar-thick liquids Temperature Spikes Noted No Respiratory Status Nasal cannula History of Recent Intubation No Behavior/Cognition Alert;Cooperative;Pleasant mood Oral Cavity Assessment WFL Oral Care Completed by SLP No Oral Cavity - Dentition Dentures, top;Dentures, bottom Self-Feeding Abilities Needs assist Patient Positioning Upright in chair Baseline Vocal Quality Wet Volitional Cough Weak Volitional Swallow Able to elicit Anatomy [facility] Pharyngeal Secretions Standing secretions in (comment) (standing secretions throughout)  11/28/20 0850 Oral Preparation/Oral Phase Oral Phase Impaired Oral - Nectar Oral - Nectar Cup Piecemeal swallowing Oral - Nectar Straw Piecemeal swallowing Oral - Thin Oral - Thin Cup Piecemeal swallowing Oral - Thin Straw Piecemeal swallowing Oral - Solids Oral - Puree Piecemeal swallowing;Lingual/palatal residue;Weak lingual manipulation Oral - Mech Soft Weak lingual manipulation;Lingual/palatal residue;Impaired mastication   11/28/20 0851 Pharyngeal Phase Pharyngeal Phase Impaired Pharyngeal - Nectar Pharyngeal- Nectar Cup Pharyngeal residue - pyriform;Reduced airway/laryngeal closure;Reduced laryngeal elevation;Reduced tongue base retraction;Reduced pharyngeal peristalsis;Reduced epiglottic inversion Pharyngeal Material enters airway, remains ABOVE vocal cords and not ejected out Pharyngeal- Nectar Straw Pharyngeal residue - pyriform;Penetration/Apiration after swallow;Reduced anterior laryngeal mobility;Reduced laryngeal elevation;Reduced airway/laryngeal closure Pharyngeal Material enters airway, remains ABOVE vocal cords and not ejected out Pharyngeal - Thin Pharyngeal- Thin Cup Pharyngeal residue - pyriform;Reduced epiglottic inversion;Reduced pharyngeal peristalsis;Reduced anterior laryngeal mobility;Reduced laryngeal elevation;Reduced airway/laryngeal closure;Reduced tongue base retraction;Pharyngeal residue - valleculae;Penetration/Aspiration before swallow;Penetration/Aspiration during swallow;Penetration/Apiration after swallow Pharyngeal- Thin Straw Pharyngeal residue - pyriform;Delayed swallow initiation-pyriform sinuses;Reduced laryngeal elevation;Reduced airway/laryngeal closure;Reduced anterior laryngeal mobility;Reduced tongue base retraction;Reduced epiglottic inversion;Pharyngeal residue - valleculae Pharyngeal Material enters airway, passes BELOW cords without attempt by patient to eject out (silent aspiration) Pharyngeal - Solids Pharyngeal- Puree Pharyngeal residue - posterior pharnyx;Other (Comment);Reduced pharyngeal peristalsis;Reduced epiglottic inversion;Reduced tongue base retraction;Pharyngeal residue - valleculae Pharyngeal Material does not enter airway Pharyngeal- Mechanical Soft Reduced pharyngeal peristalsis;Reduced epiglottic inversion;Pharyngeal residue - valleculae Pharyngeal Material does not enter airway Pharyngeal Phase - Comment Pharyngeal Comment pt conducts extended breath hold with swallows *up  to 5 swallows with single bolus* to clear material from pharynx, she also conducts piecemeailng as compensation, penetration of nectar after swallow due to pyriform retention spilling into open larynx, head turn left (pt leaning left) was helpful to decrease accumulation of boluses in pharynx; swallow was inconsistent in presentation - modified likely by bolus size and pt effort  11/28/20 0917 Clinical Impression Clinical Impression Patient presents with 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  Testing was limited by pt's shoulders blocking UES/CP despite efforts to offset.  Impaired oral coordination noted resulting in premature spillage of liquids to pyriform and prolonged mastication and oral retention with solids. Pharyngeal swallow marked by impaired laryngeal elevation/closure, poor pharyngeal contraction allowing retention that mixed with secretions. Swallow was inconsistent in presentation, likely modulated by her effort and bolus size.   Pt conducts breath hold with up to five sequential swallows to clear a single bolus of thin liquids - causing SLP to suspect chronic nature of dysphagia that she has managed with self-taught compensations.   She does penetrate and aspirate with thin without awareness - protective cough response.  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  Chin tuck worsened airway protection as thin with secretions infiltrated larynx post-swallow.  Head turn left *pt leaning left* did decrease pharyngeal retention.  Wet vocal quality observed at baseline and after MBS, indicative of secretion aspiration. Pt endorses wet voice for several months.  Recommend dys3/nectar and allow thin water between meals with head turn left, multiple swallows.  Follow up with SLP at [facility] indicated for dysphagia management. SLP Visit Diagnosis Dysphagia, oropharyngeal phase (R13.12);Dysphagia, pharyngoesophageal phase  (R13.14) Impact on safety and function Moderate aspiration risk;Risk for inadequate nutrition/hydration Swallow Evaluation Recommendations SLP Diet Recommendations Dysphagia 3 (Mech soft) solids;Nectar thick liquid (thin water between meals) Liquid Administration via Cup;Straw Medication Administration Crushed with puree Supervision Staff to assist with self feeding;Full supervision/cueing for compensatory strategies Compensations Slow rate;Small  sips/bites;Follow solids with liquid (head turn left, intermitent cough/throat clear and re-swallow) Postural Changes Remain semi-upright after after feeds/meals (Comment);Seated upright at 90 degrees Treatment Plan Oral Care Recommendations Oral care QID Assistance recommended at discharge Frequent or constant Supervision/Assistance Functional Status Assessment Patient has had a recent decline in their functional status and demonstrates the ability to make significant improvements in function in a reasonable and predictable amount of time. Speech Therapy Frequency (ACUTE ONLY) min 1 x/week Treatment Duration 1 week Interventions Aspiration precaution training;Compensatory techniques;Patient/family education  11/28/20 0919 Prognosis Prognosis for Safe Diet Advancement Good Barriers to Reach Goals Time post onset Individuals Consulted Consulted and Agree with Results and Recommendations Patient Report Sent to  Referring physician Progression Toward Goals Progression toward goals Progressing toward goals SLP Time Calculation SLP Start Time (ACUTE ONLY) 0815 SLP Stop Time (ACUTE ONLY) 0845 SLP Time Calculation (min) (ACUTE ONLY) 30 min SLP Evaluations $ SLP Speech Visit 1 Visit SLP Evaluations $MBS Swallow 1 Procedure $Swallowing Treatment 1 Procedure Kathleen Lime, MS [facility] SLP Acute Rehab Services Office [phone] Pager [phone]    Scheduled Meds:  artificial tears   Both Eyes QHS   aspirin EC  81 mg Oral Daily   atorvastatin  10 mg Oral QHS   azithromycin  500 mg Oral  QHS   cycloSPORINE  1 drop Both Eyes BID   dorzolamide  1 drop Both Eyes BID   erythromycin  1 application Left Eye TID   feeding supplement  1 Container Oral BID BM   feeding supplement  237 mL Oral Q24H   furosemide  20 mg Intravenous Daily   gabapentin  300 mg Oral QHS   gatifloxacin  1 drop Both Eyes Daily   guaiFENesin  600 mg Oral BID   heparin  5,000 Units Subcutaneous Q8H   insulin aspart  0-5 Units Subcutaneous QHS   insulin aspart  0-9 Units Subcutaneous TID WC   mirtazapine  15 mg Oral QHS   multivitamin with minerals  1 tablet Oral Daily   nutrition supplement (JUVEN)  1 packet Oral BID BM   nystatin  5 mL Oral QID   pantoprazole  40 mg Oral Daily   prednisoLONE acetate  1 drop Right Eye Daily   predniSONE  10 mg Oral Q breakfast   Followed by   Derrill Memo ON 12/03/2020] predniSONE  10 mg Oral Q breakfast   pyridostigmine  60 mg Oral TID   timolol  1 drop Both Eyes BID   Continuous Infusions:  cefTRIAXone (ROCEPHIN)  IV 1 g (11/28/20 2322)   potassium PHOSPHATE IVPB (in mmol) 20 mmol (11/29/20 1000)    LOS: 3 days   Kerney Elbe, DO [facility] PAGER is on AMION  If 7PM-7AM, please contact night-coverage [email or web address]

## 2020-11-30 ENCOUNTER — Inpatient Hospital Stay (HOSPITAL_COMMUNITY): Payer: Medicare (Managed Care)

## 2020-11-30 ENCOUNTER — Telehealth: Payer: Self-pay | Admitting: Pulmonary Disease

## 2020-11-30 DIAGNOSIS — E1165 Type 2 diabetes mellitus with hyperglycemia: Secondary | ICD-10-CM

## 2020-11-30 LAB — CBC WITH DIFFERENTIAL/PLATELET
Abs Immature Granulocytes: 0.14 10*3/uL — ABNORMAL HIGH (ref 0.00–0.07)
Basophils Absolute: 0 10*3/uL (ref 0.0–0.1)
Basophils Relative: 0 %
Eosinophils Absolute: 0.1 10*3/uL (ref 0.0–0.5)
Eosinophils Relative: 0 %
HCT: 28 % — ABNORMAL LOW (ref 36.0–46.0)
Hemoglobin: 9 g/dL — ABNORMAL LOW (ref 12.0–15.0)
Immature Granulocytes: 1 %
Lymphocytes Relative: 8 %
Lymphs Abs: 1.2 10*3/uL (ref 0.7–4.0)
MCH: 32.7 pg (ref 26.0–34.0)
MCHC: 32.1 g/dL (ref 30.0–36.0)
MCV: 101.8 fL — ABNORMAL HIGH (ref 80.0–100.0)
Monocytes Absolute: 1.2 10*3/uL — ABNORMAL HIGH (ref 0.1–1.0)
Monocytes Relative: 9 %
Neutro Abs: 11.4 10*3/uL — ABNORMAL HIGH (ref 1.7–7.7)
Neutrophils Relative %: 82 %
Platelets: 182 10*3/uL (ref 150–400)
RBC: 2.75 MIL/uL — ABNORMAL LOW (ref 3.87–5.11)
RDW: 13.5 % (ref 11.5–15.5)
WBC: 14 10*3/uL — ABNORMAL HIGH (ref 4.0–10.5)
nRBC: 0 % (ref 0.0–0.2)

## 2020-11-30 LAB — COMPREHENSIVE METABOLIC PANEL
ALT: 21 U/L (ref 0–44)
AST: 17 U/L (ref 15–41)
Albumin: 2.1 g/dL — ABNORMAL LOW (ref 3.5–5.0)
Alkaline Phosphatase: 123 U/L (ref 38–126)
Anion gap: 7 (ref 5–15)
BUN: 30 mg/dL — ABNORMAL HIGH (ref 8–23)
CO2: 27 mmol/L (ref 22–32)
Calcium: 8.7 mg/dL — ABNORMAL LOW (ref 8.9–10.3)
Chloride: 102 mmol/L (ref 98–111)
Creatinine, Ser: 1.06 mg/dL — ABNORMAL HIGH (ref 0.44–1.00)
GFR, Estimated: 52 mL/min — ABNORMAL LOW (ref >60)
Glucose, Bld: 201 mg/dL — ABNORMAL HIGH (ref 70–99)
Potassium: 4.1 mmol/L (ref 3.5–5.1)
Sodium: 136 mmol/L (ref 135–145)
Total Bilirubin: 0.6 mg/dL (ref 0.3–1.2)
Total Protein: 5.2 g/dL — ABNORMAL LOW (ref 6.5–8.1)

## 2020-11-30 LAB — PHOSPHORUS: Phosphorus: 2.8 mg/dL (ref 2.5–4.6)

## 2020-11-30 LAB — GLUCOSE, CAPILLARY
Glucose-Capillary: 182 mg/dL — ABNORMAL HIGH (ref 70–99)
Glucose-Capillary: 166 mg/dL — ABNORMAL HIGH (ref 70–99)
Glucose-Capillary: 140 mg/dL — ABNORMAL HIGH (ref 70–99)

## 2020-11-30 LAB — MAGNESIUM: Magnesium: 2 mg/dL (ref 1.7–2.4)

## 2020-11-30 MED ORDER — ADULT MULTIVITAMIN W/MINERALS CH: 1.0000 | ORAL_TABLET | Freq: Every day | ORAL | 0 refills | Status: AC

## 2020-11-30 MED ORDER — JUVEN PO PACK: 1.0000 | PACK | Freq: Two times a day (BID) | ORAL | 0 refills | Status: AC

## 2020-11-30 MED ORDER — ENSURE ENLIVE PO LIQD: 237.0000 mL | ORAL | 12 refills | Status: AC

## 2020-11-30 MED ORDER — NYSTATIN 100000 UNIT/ML MT SUSP: 5.0000 mL | Freq: Four times a day (QID) | OROMUCOSAL | 0 refills | Status: AC

## 2020-11-30 MED ORDER — PREDNISONE 10 MG PO TABS: ORAL_TABLET | ORAL | 0 refills | Status: AC

## 2020-11-30 MED ORDER — AMOXICILLIN-POT CLAVULANATE 875-125 MG PO TABS
1.0000 | ORAL_TABLET | Freq: Two times a day (BID) | ORAL | 0 refills | Status: AC
Start: 2020-11-30 — End: 2020-12-28

## 2020-11-30 MED ORDER — AMOXICILLIN-POT CLAVULANATE 875-125 MG PO TABS: 1.0000 | ORAL_TABLET | Freq: Two times a day (BID) | ORAL | 0 refills | Status: DC

## 2020-11-30 NOTE — Progress Notes (Signed)
Attempted #1 to call report to [facility] [phone] ) Call transferred to [facility] and call placed on hold. Pt going to room 226.  Jerene Pitch

## 2020-11-30 NOTE — Discharge Summary (Signed)
Physician Discharge Summary  Terry Alexander EGB:[id number] DOB: 10-Sep-1940 DOA: 11/25/2020  PCP: Janifer Adie, MD  Admit date: 11/25/2020 Discharge date: 11/30/2020  Admitted From: SNF Disposition:  SNF  Recommendations for Outpatient Follow-up:  Follow up with PCP in 1-2 weeks Follow up with Rheumatology within 1-2 weeks for Prednisone Management  Repeat CXR in 3-6 weeks and outpatient CT Chest for further evaluation of unchanged left upper lung airspace opacity, possibly with central cavitation Continue Augmentin for 4 weeks Follow up with Pulmonary as an outpatient  Please obtain CMP/CBC, Mag, Phos in one week Please follow up on the following pending results:  Home Health: No  Equipment/Devices: None  Discharge Condition: Stable CODE STATUS: DO NOT RESUSCITATE  Diet recommendation: Heart Healthy Carb Modified Dysphagia 3 Diet with Nectar Thick Liquids   Brief/Interim Summary: The patient is an 80 year old African-American female with a past medical history significant for but not limited to hypertension, diabetes mellitus type 2, Sjogren's syndrome multiple other comorbidities who is currently a skilled nursing facility resident who was admitted to hospital with altered mental status, dehydration and pneumonia.  She is started on antibiotics.  There is concern for aspiration she was seen by speech therapy.  She was given IV fluids which is improved her dehydration but now has been started back on Diuretics.  SLP evaluated and recommending dysphagia 3 diet with nectar thick liquids.  Nutrition is also seen given her severe malnutrition in the context of chronic illness and made some recommendations.  Repeat CXR this AM showed "Stable cardiomegaly. Hazy opacities in the left hilar region and left lung base concerning for airspace disease. Follow-up examination with AP/lateral view would be helpful."  Antibiotics were to be changed to IV Unasyn however they were then changed to p.o.  Augmentin.  Patient tolerated this well and WBC continues to improve.  Of note her prednisone taper started but she did not receive the 20 mg so will initiate now and then transition back to 10 mg daily for 7 days with outpatient follow-up with her rheumatologist.  She has been deemed medically stable to be discharged home at this time and follow-up with PCP and rheumatology outpatient setting   Discharge Diagnoses:  Principal Problem:   CAP (community acquired pneumonia) Active Problems:   Diabetes mellitus ([place])   Sepsis ([place])   AKI (acute kidney injury) ([place])   Acute metabolic encephalopathy   Elevated troponin   Pressure injury of skin  Pneumonia, present on admission likely community-acquired versus aspiration more consistent with aspiration,  -CXR on 11/25/20 showed "Cardiomegaly with mildly distended pulmonary vasculature. Patchy airspace disease in the left lung with consolidation at the left lung base. Small left pleural effusion. -Continue antibiotics with azithromycin and ceftriaxone but will change to IV Unasyn today and likely discharge on p.o. Augmentin for 4 weeks now given possible Central Cavitation noted on today's CXR -C/w Guaifenesin 600 mg po BID and increase to 1200 mg po BID -Add Flutter Valve and Incentive Spirometry  -WBC went from 11.9 -> 21.8 -> 22.3 -> 20.3; she is on prednisone and could be steroid margination given that she got 2 doses of 40 mg -Add Xopenex and Atrovent q6hprn  -SLP consulted and recommending  Dysphagia 3 Diet with Nectar Thick Liquids with eating with her neck position to the left -Will need to repeat CXR  in 3-6 weeks and recommending outpatient Chest CT Scan for further evaluation of "Unchanged left basilar consolidation and probable effusion. Unchanged left upper lung airspace  opacity, possibly with central cavitation." -Discussed with Pulmonary Dr. Silas Flood who will have the patient followed as an outpatient in Clinic    AKI on CKD stage  IIIa -Her AKI was likely in the setting of ARB and diuretic use -Creatinine was 1.6 on admission -Improved with IVF and now back on Diuretics with Furosemide 20 mg IV Daily  -Patient's BUN to creatinine is improved to 33/1.17 -> 34/1.18 -> 35/1.13 -> 30/1.06 today -Further nephrotoxic medications, contrast dyes, hypotension and dehydration and renally dose medications -Repeat CMP within within 1 week    Acute metabolic encephalopathy, improved -In the setting of infection and dehydration -Overall mental status appears to be improving and likely approaching baseline and is much improved today than it was yesterday -Last hospitalization she did have a episode of hospital delirium -We will place on delirium precautions   Chronic hypoxic respiratory failure -Chronically is on 3 L supplemental oxygen -SpO2: 100 % O2 Flow Rate (L/min): 3 L/min -Currently her supplemental oxygen requirement appears to be baseline -Repeat chest x-ray this AM showed "Unchanged, obscured cardiac silhouette. Unchanged left basilar consolidation and left pleural effusion. Unchanged left upper lobe airspace opacity, with possible central lucency. The right lung is clear. No visible pneumothorax. Bilateral shoulder degenerative changes. No acute osseous abnormality. There is contrast material and left upper quadrant bowel."   Chronic combined CHF -Echo showed an EF of 45 to 50% and grade 1 diastolic dysfunction -Currently appears compensated -Strict I's and O's and daily weights -Patient is + 940 mL since admission -She was restarted on Lasix given that she appeared that started developing anasarca and is now on IV Lasix 20 mg p.o. daily and we have changed back to her home dose of torsemide 10 mg po Daily    Diabetes mellitus type 2 -Chronically on linagliptin as well as NovoLog -Continue sliding scale insulin with Sensitive Novolog SSI AC -Continue to monitor blood sugars per protocol; CBGs have been ranging from  [id number]   Sjoren's syndrome with Keratoconjunctivitis -She is followed by ophthalmology and rheumatology in outpatient setting -She is on prednisone which we will continue with 10 mg po Daily  x5 doses followed by Prednisone 10 mg po Daily x 7 doses; she is actually supposed be getting 20 mg p.o. daily for 5 days and then 10 mg for 7 so we will go back to 20 mg for 3 days and then 10 mg for the 7 days -C/w Prednisolone 1 drop in Right Eye Daily and Timolol 1 drop both Eyes BID as well as Gatifloxacin 1 drop in both eyes Daily, Erythromycin Ophthalmic Ointment 1 application in Left Ey TID, Dorzolamide 1 drop in both eyes BID, and Cyclosoporine 1 drop in both eyes BID and Aritificial Tears  both eyes qHS -C/w Pyridostigmine 60 mg po TID  -SNF records were reviewed and prednisone taper was adjusted and corrected -Follow-up with rheumatology in outpatient setting   Leukocytosis -Likely related to pneumonia versus prednisone usage given that she is chronically on prednisone -WBC went from 21.8 and trended up to 22.3 but is now trending back down to 20.3 and today was 14.0 -Was going to change Abx to Unasyn but then just changed to p.o. Augmentin for D/C with good tolerance -Continue to monitor for signs and symptoms of infection -Repeat CBC within 1 week   HLD -C/w Atorvastatin 10 mg po qHS   Oral Thrush -She was initiated on Nystatin 500,000 units 4 times daily solution for another 10 more days  Severe Malnutrition in the Context of Chronic illness -Consulted dietitian for further evaluation and recommendations -SLP recommended dysphagia 3 diet with nectar thick liquids -The dietitian has been consulted recommending decreasing her Trileptal from twice daily to once a day and recommending ordering boost breeze p.o. twice daily as well as 1 packet of Juven twice daily and ordering 1 tablet of multivitamin with minerals daily and recommending liberalizing diet from a heart healthy carb modified  regular diet given her severe malnutrition and wounds  Discharge Instructions  Discharge Instructions     Call MD for:  difficulty breathing, headache or visual disturbances   Complete by: As directed    Call MD for:  extreme fatigue   Complete by: As directed    Call MD for:  hives   Complete by: As directed    Call MD for:  persistant dizziness or light-headedness   Complete by: As directed    Call MD for:  persistant nausea and vomiting   Complete by: As directed    Call MD for:  redness, tenderness, or signs of infection (pain, swelling, redness, odor or green/yellow discharge around incision site)   Complete by: As directed    Call MD for:  severe uncontrolled pain   Complete by: As directed    Call MD for:  temperature >100.4   Complete by: As directed    Diet - low sodium heart healthy   Complete by: As directed    Dysphagia 3 Diet with Nectar Thick Liquids   Diet Carb Modified   Complete by: As directed    Discharge instructions   Complete by: As directed    You were cared for by a hospitalist during your hospital stay. If you have any questions about your discharge medications or the care you received while you were in the hospital after you are discharged, you can call the unit and ask to speak with the hospitalist on call if the hospitalist that took care of you is not available. Once you are discharged, your primary care physician will handle any further medical issues. Please note that NO REFILLS for any discharge medications will be authorized once you are discharged, as it is imperative that you return to your primary care physician (or establish a relationship with a primary care physician if you do not have one) for your aftercare needs so that they can reassess your need for medications and monitor your lab values.  Follow up with PCP and Rheumatology within 1-2 weeks. Take all medications as prescribed. If symptoms change or worsen please return to the ED for  evaluation   Discharge wound care:   Complete by: As directed    Pressure Injury 10/24/20 Heel Right;Medial Unstageable - Full thickness tissue loss in which the base of the injury is covered by slough (yellow, tan, gray, green or brown) and/or eschar (tan, brown or black) in the wound bed. Brown Eschar     Pressure Injury 11/26/20 Foot Left;Lateral Unstageable - Full thickness tissue loss in which the base of the injury is covered by slough (yellow, tan, gray, green or brown) and/or eschar (tan, brown or black) in the wound bed.    Pressure Injury 11/26/20 Heel Right Unstageable - Full thickness tissue loss in which the base of the injury is covered by slough (yellow, tan, gray, green or brown) and/or eschar (tan, brown or black) in the wound bed. Black    Pressure Injury 11/26/20 Sacrum Mid;Medial Stage 2 -  Partial thickness loss  of dermis presenting as a shallow open injury with a red, pink wound bed without slough. pink   Increase activity slowly   Complete by: As directed       Allergies as of 11/30/2020       Reactions   Methotrexate Derivatives Other (See Comments)   Transaminitis, anemia, weight loss   Codeine Nausea And Vomiting        Medication List     STOP taking these medications    cefTRIAXone 1 g injection Commonly known as: ROCEPHIN   doxycycline 100 MG tablet Commonly known as: VIBRA-TABS   fexofenadine 180 MG tablet Commonly known as: ALLEGRA   lubriderm seriously sensitive Lotn   OCUSOFT EYE WASH OP   polyethylene glycol 17 g packet Commonly known as: MIRALAX / GLYCOLAX   senna 8.6 MG Tabs tablet Commonly known as: SENOKOT   simethicone 80 MG chewable tablet Commonly known as: MYLICON   Systane Nighttime Oint       TAKE these medications    acetaminophen 650 MG CR tablet Commonly known as: TYLENOL Take 1,300 mg by mouth in the morning and at bedtime.   amoxicillin-clavulanate 875-125 MG tablet Commonly known as: AUGMENTIN Take 1  tablet by mouth every 12 (twelve) hours for 2 days.   aspirin 81 MG tablet Take 81 mg by mouth daily.   atorvastatin 10 MG tablet Commonly known as: LIPITOR Take 10 mg by mouth at bedtime.   Cholecalciferol 25 MCG (1000 UT) tablet Take 1,000 Units by mouth daily.   cycloSPORINE 0.05 % ophthalmic emulsion Commonly known as: RESTASIS Place 1 drop into both eyes 2 (two) times daily.   dorzolamide 2 % ophthalmic solution Commonly known as: TRUSOPT Place 1 drop into both eyes 2 (two) times daily.   erythromycin ophthalmic ointment Place 1 application into the left eye 3 (three) times daily.   feeding supplement Liqd Take 237 mLs by mouth daily. What changed:  how much to take when to take this   nutrition supplement (JUVEN) Pack Take 1 packet by mouth 2 (two) times daily between meals. What changed: You were already taking a medication with the same name, and this prescription was added. Make sure you understand how and when to take each.   folic acid 1 MG tablet Commonly known as: FOLVITE Take 2 tablets (2 mg total) by mouth daily.   gabapentin 300 MG capsule Commonly known as: NEURONTIN Take 300 mg by mouth at bedtime.   guaiFENesin 600 MG 12 hr tablet Commonly known as: MUCINEX Take 600 mg by mouth See admin instructions. Give 600mg  by mouth twice a day for 14 days   Insulin Pen Needle 30G X 8 MM Misc Commonly known as: NOVOFINE Inject 1 packet into the skin as needed.   ipratropium-albuterol 0.5-2.5 (3) MG/3ML Soln Commonly known as: DUONEB Take 3 mLs by nebulization every 2 (two) hours as needed (for cough, wheezing, shortness of breath).   lidocaine 5 % Commonly known as: LIDODERM Place 1 patch onto the skin daily. Apple once daily to leg at night for leg pain   linagliptin 5 MG Tabs tablet Commonly known as: TRADJENTA Take 5 mg by mouth daily.   losartan 50 MG tablet Commonly known as: COZAAR Take 50 mg by mouth daily.   Magnesium 400 MG Tabs Take  400 mg by mouth daily.   melatonin 1 MG Tabs tablet Take 1 mg by mouth at bedtime.   mirtazapine 15 MG tablet Commonly known as: REMERON Take  15 mg by mouth at bedtime.   moxifloxacin 0.5 % ophthalmic solution Commonly known as: VIGAMOX Place 1 drop into both eyes daily.   multivitamin with minerals Tabs tablet Take 1 tablet by mouth daily. Start taking on: December 01, 2020   NovoLOG FlexPen 100 UNIT/ML FlexPen Generic drug: insulin aspart Inject 10 Units into the skin See admin instructions. Inject 10 units into the skin if blood glucose is greater than 400   nystatin 100000 UNIT/ML suspension Commonly known as: MYCOSTATIN Take 5 mLs (500,000 Units total) by mouth 4 (four) times daily for 10 days.   pantoprazole 40 MG tablet Commonly known as: PROTONIX Take 40 mg by mouth daily.   prednisoLONE acetate 1 % ophthalmic suspension Commonly known as: PRED FORTE Place 1 drop into the right eye daily in the afternoon.   predniSONE 10 MG tablet Commonly known as: DELTASONE Take 2 tablets (20 mg total) by mouth daily with breakfast for 3 days, THEN 1 tablet (10 mg total) daily with breakfast for 7 days. Start taking on: December 01, 2020 What changed:  medication strength See the new instructions.   pyridostigmine 60 MG tablet Commonly known as: Mestinon Take 1 tablet (60 mg total) by mouth 3 (three) times daily.   Rectiv 0.4 % Oint Generic drug: Nitroglycerin Place 1 application rectally in the morning and at bedtime.   Systane 0.4-0.3 % Gel ophthalmic gel Generic drug: Polyethyl Glycol-Propyl Glycol Place 1 application into both eyes at bedtime. What changed: Another medication with the same name was removed. Continue taking this medication, and follow the directions you see here.   timolol 0.5 % ophthalmic solution Commonly known as: BETIMOL Place 1 drop into both eyes 2 (two) times daily.   torsemide 10 MG tablet Commonly known as: DEMADEX Take 10 mg by mouth  daily.   vitamin C 1000 MG tablet Take 1,000 mg by mouth daily.               Discharge Care Instructions  (From admission, onward)           Start     Ordered   11/30/20 0000  Discharge wound care:       Comments: Pressure Injury 10/24/20 Heel Right;Medial Unstageable - Full thickness tissue loss in which the base of the injury is covered by slough (yellow, tan, gray, green or brown) and/or eschar (tan, brown or black) in the wound bed. Brown Eschar     Pressure Injury 11/26/20 Foot Left;Lateral Unstageable - Full thickness tissue loss in which the base of the injury is covered by slough (yellow, tan, gray, green or brown) and/or eschar (tan, brown or black) in the wound bed.    Pressure Injury 11/26/20 Heel Right Unstageable - Full thickness tissue loss in which the base of the injury is covered by slough (yellow, tan, gray, green or brown) and/or eschar (tan, brown or black) in the wound bed. Black    Pressure Injury 11/26/20 Sacrum Mid;Medial Stage 2 -  Partial thickness loss of dermis presenting as a shallow open injury with a red, pink wound bed without slough. pink   11/30/20 1253            Allergies  Allergen Reactions   Methotrexate Derivatives Other (See Comments)    Transaminitis, anemia, weight loss   Codeine Nausea And Vomiting    Consultations: Discussed with Pulmonary  Procedures/Studies: DG Chest 2 View  Result Date: 11/25/2020 CLINICAL DATA:  Infection. EXAM: CHEST - 2 VIEW COMPARISON:  02/03/2020. FINDINGS: The heart is borderline enlarged the pulmonary vasculature is distended. Mild bronchiectasis is noted bilaterally. There is patchy airspace disease in the perihilar region on the left and consolidation at the left lung base. Small left pleural effusion is noted. No pneumothorax is seen. No acute osseous abnormality. IMPRESSION: 1. Cardiomegaly with mildly distended pulmonary vasculature. 2. Patchy airspace disease in the left lung with  consolidation at the left lung base. 3. Small left pleural effusion. Electronically Signed   By: Brett Fairy M.D.   On: 11/25/2020 22:32   DG CHEST PORT 1 VIEW  Result Date: 11/30/2020 CLINICAL DATA:  Shortness of breath EXAM: PORTABLE CHEST 1 VIEW COMPARISON:  Radiograph 11/29/2020 FINDINGS: Unchanged, obscured cardiac silhouette. Unchanged left basilar consolidation and left pleural effusion. Unchanged left upper lobe airspace opacity, with possible central lucency. The right lung is clear. No visible pneumothorax. Bilateral shoulder degenerative changes. No acute osseous abnormality. There is contrast material and left upper quadrant bowel. IMPRESSION: Unchanged left basilar consolidation and probable effusion. Unchanged left upper lung airspace opacity, possibly with central cavitation. Chest CT would be useful for further evaluation. Electronically Signed   By: Maurine Simmering M.D.   On: 11/30/2020 08:09   DG CHEST PORT 1 VIEW  Result Date: 11/29/2020 CLINICAL DATA:  Shortness of breath EXAM: PORTABLE CHEST 1 VIEW COMPARISON:  Chest radiograph dated November 25, 2020 FINDINGS: Stable cardiomegaly. Left hilar hazy opacity and left basilar atelectasis/opacification are unchanged concerning for airspace disease. IMPRESSION: 1.  Stable cardiomegaly. 2. Hazy opacities in the left hilar region and left lung base concerning for airspace disease. Follow-up examination with AP/lateral view would be helpful. Electronically Signed   By: Keane Police D.O.   On: 11/29/2020 08:16   DG Foot Complete Right  Result Date: 11/25/2020 CLINICAL DATA:  Wound to right heel.  Diabetic patient. EXAM: RIGHT FOOT COMPLETE - 3+ VIEW COMPARISON:  None. FINDINGS: Bony under mineralization. Hammertoe deformity of the digits. No erosion, periosteal reaction, or bone destruction. Mild midfoot degenerative change with spurring. There is a moderate plantar calcaneal spur. Vascular calcifications are seen. Mild skin irregularity  overlies the heel. No soft tissue air. No radiopaque foreign body. Overlying dressing in place. IMPRESSION: 1. No radiographic evidence of osteomyelitis. 2. Skin irregularity overlies the heel may represent site of laceration. No radiopaque foreign body or soft tissue air. 3. Bony under mineralization with hammertoe deformity of the digits and mild midfoot degenerative change. Electronically Signed   By: Keith Rake M.D.   On: 11/25/2020 23:35   DG Swallowing Func-Speech Pathology  Result Date: 11/29/2020 Table formatting from the original result was not included.  11/28/20 0801 Pain Assessment Pain Assessment No/denies pain General Information HPI 80 yo female who has been at rehab, admitted to [facility] with AMS hyperglycemia CBGs in the 400s - diagnosed with CAP-  CXR showed patchy airspace disease in the perihilar region on the left and consolidation at the L lung base with small L pleural effusion. Pt with PMH + for Sjogren's disease, CAD, HLD, HTN, type 2 DM, stage 2-3 CKD, breast cancer s/p masectomy in 1977, stomach cancer, malnutrition, CAS with NSTEMI in 10/2020 and cardiomyopathy.  Pt with 55 pound weight loss. Type of Study MBS-Modified Barium Swallow Study Diet Prior to this Study Regular;Nectar-thick liquids Temperature Spikes Noted No Respiratory Status Nasal cannula History of Recent Intubation No Behavior/Cognition Alert;Cooperative;Pleasant mood Oral Cavity Assessment WFL Oral Care Completed by SLP No Oral Cavity - Dentition Dentures, top;Dentures, bottom Self-Feeding Abilities  Needs assist Patient Positioning Upright in chair Baseline Vocal Quality Wet Volitional Cough Weak Volitional Swallow Able to elicit Anatomy [facility] Pharyngeal Secretions Standing secretions in (comment) (standing secretions throughout)  11/28/20 0850 Oral Preparation/Oral Phase Oral Phase Impaired Oral - Nectar Oral - Nectar Cup Piecemeal swallowing Oral - Nectar Straw Piecemeal swallowing Oral - Thin Oral - Thin Cup  Piecemeal swallowing Oral - Thin Straw Piecemeal swallowing Oral - Solids Oral - Puree Piecemeal swallowing;Lingual/palatal residue;Weak lingual manipulation Oral - Mech Soft Weak lingual manipulation;Lingual/palatal residue;Impaired mastication  11/28/20 0851 Pharyngeal Phase Pharyngeal Phase Impaired Pharyngeal - Nectar Pharyngeal- Nectar Cup Pharyngeal residue - pyriform;Reduced airway/laryngeal closure;Reduced laryngeal elevation;Reduced tongue base retraction;Reduced pharyngeal peristalsis;Reduced epiglottic inversion Pharyngeal Material enters airway, remains ABOVE vocal cords and not ejected out Pharyngeal- Nectar Straw Pharyngeal residue - pyriform;Penetration/Apiration after swallow;Reduced anterior laryngeal mobility;Reduced laryngeal elevation;Reduced airway/laryngeal closure Pharyngeal Material enters airway, remains ABOVE vocal cords and not ejected out Pharyngeal - Thin Pharyngeal- Thin Cup Pharyngeal residue - pyriform;Reduced epiglottic inversion;Reduced pharyngeal peristalsis;Reduced anterior laryngeal mobility;Reduced laryngeal elevation;Reduced airway/laryngeal closure;Reduced tongue base retraction;Pharyngeal residue - valleculae;Penetration/Aspiration before swallow;Penetration/Aspiration during swallow;Penetration/Apiration after swallow Pharyngeal- Thin Straw Pharyngeal residue - pyriform;Delayed swallow initiation-pyriform sinuses;Reduced laryngeal elevation;Reduced airway/laryngeal closure;Reduced anterior laryngeal mobility;Reduced tongue base retraction;Reduced epiglottic inversion;Pharyngeal residue - valleculae Pharyngeal Material enters airway, passes BELOW cords without attempt by patient to eject out (silent aspiration) Pharyngeal - Solids Pharyngeal- Puree Pharyngeal residue - posterior pharnyx;Other (Comment);Reduced pharyngeal peristalsis;Reduced epiglottic inversion;Reduced tongue base retraction;Pharyngeal residue - valleculae Pharyngeal Material does not enter airway Pharyngeal-  Mechanical Soft Reduced pharyngeal peristalsis;Reduced epiglottic inversion;Pharyngeal residue - valleculae Pharyngeal Material does not enter airway Pharyngeal Phase - Comment Pharyngeal Comment pt conducts extended breath hold with swallows *up to 5 swallows with single bolus* to clear material from pharynx, she also conducts piecemeailng as compensation, penetration of nectar after swallow due to pyriform retention spilling into open larynx, head turn left (pt leaning left) was helpful to decrease accumulation of boluses in pharynx; swallow was inconsistent in presentation - modified likely by bolus size and pt effort  11/28/20 0917 Clinical Impression Clinical Impression Patient presents with 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  Testing was limited by pt's shoulders blocking UES/CP despite efforts to offset.  Impaired oral coordination noted resulting in premature spillage of liquids to pyriform and prolonged mastication and oral retention with solids. Pharyngeal swallow marked by impaired laryngeal elevation/closure, poor pharyngeal contraction allowing retention that mixed with secretions. Swallow was inconsistent in presentation, likely modulated by her effort and bolus size.   Pt conducts breath hold with up to five sequential swallows to clear a single bolus of thin liquids - causing SLP to suspect chronic nature of dysphagia that she has managed with self-taught compensations.   She does penetrate and aspirate with thin without awareness - protective cough response.  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  Chin tuck worsened airway protection as thin with secretions infiltrated larynx post-swallow.  Head turn left *pt leaning left* did decrease pharyngeal retention.  Wet vocal quality observed at baseline and after MBS, indicative of secretion aspiration. Pt endorses wet voice for several months.  Recommend  dys3/nectar and allow thin water between meals with head turn left, multiple swallows.  Follow up with SLP at SNF indicated for dysphagia management. SLP Visit Diagnosis Dysphagia, oropharyngeal phase (R13.12);Dysphagia, pharyngoesophageal phase (R13.14) Impact on safety and function Moderate aspiration risk;Risk for inadequate nutrition/hydration Swallow Evaluation Recommendations SLP Diet Recommendations Dysphagia 3 (Mech  soft) solids;Nectar thick liquid (thin water between meals) Liquid Administration via Cup;Straw Medication Administration Crushed with puree Supervision Staff to assist with self feeding;Full supervision/cueing for compensatory strategies Compensations Slow rate;Small sips/bites;Follow solids with liquid (head turn left, intermitent cough/throat clear and re-swallow) Postural Changes Remain semi-upright after after feeds/meals (Comment);Seated upright at 90 degrees Treatment Plan Oral Care Recommendations Oral care QID Assistance recommended at discharge Frequent or constant Supervision/Assistance Functional Status Assessment Patient has had a recent decline in their functional status and demonstrates the ability to make significant improvements in function in a reasonable and predictable amount of time. Speech Therapy Frequency (ACUTE ONLY) min 1 x/week Treatment Duration 1 week Interventions Aspiration precaution training;Compensatory techniques;Patient/family education  11/28/20 0919 Prognosis Prognosis for Safe Diet Advancement Good Barriers to Reach Goals Time post onset Individuals Consulted Consulted and Agree with Results and Recommendations Patient Report Sent to  Referring physician Progression Toward Goals Progression toward goals Progressing toward goals SLP Time Calculation SLP Start Time (ACUTE ONLY) 0815 SLP Stop Time (ACUTE ONLY) 0845 SLP Time Calculation (min) (ACUTE ONLY) 30 min SLP Evaluations $ SLP Speech Visit 1 Visit SLP Evaluations $MBS Swallow 1 Procedure $Swallowing  Treatment 1 Procedure Kathleen Lime, MS [facility] SLP Acute Rehab Services Office [phone] Pager [phone]     Subjective: Seen and examined at bedside she is doing fairly well.  Baseline.  No nausea or vomiting.  Denies any chest pain or shortness of breath.  Feels okay.  No other concerns or complaints time.  Discharge Exam: Vitals:   11/30/20 0452 11/30/20 1134  BP: 124/64 (!) 114/54  Pulse: 97 94  Resp: 20 16  Temp: 98.2 F (36.8 C) 99 F (37.2 C)  SpO2: 98% 98%   Vitals:   11/29/20 1237 11/29/20 2127 11/30/20 0452 11/30/20 1134  BP: 117/63 128/62 124/64 (!) 114/54  Pulse: 73 85 97 94  Resp: 16 20 20 16   Temp:  97.7 F (36.5 C) 98.2 F (36.8 C) 99 F (37.2 C)  TempSrc:  Oral Oral Oral  SpO2: 100% 100% 98% 98%  Weight:      Height:       General: Pt is alert, awake, not in acute distress Cardiovascular: RRR, S1/S2 +, no rubs, no gallops Respiratory: Diminished bilaterally with coarse breath sounds on the left compared to the right, no wheezing, no rhonchi; wearing supplemental oxygen via nasal cannula with 3 L Abdominal: Soft, NT, ND, bowel sounds + Extremities: No appreciable edema, no cyanosis  The results of significant diagnostics from this hospitalization (including imaging, microbiology, ancillary and laboratory) are listed below for reference.    Microbiology: Recent Results (from the past 240 hour(s))  Culture, blood (routine x 2)     Status: None (Preliminary result)   Collection Time: 11/25/20  9:55 PM   Specimen: BLOOD  Result Value Ref Range Status   Specimen Description   Final    BLOOD LEFT ARM Performed at [facility] [address] [place], [place] [address]    Special Requests   Final    BOTTLES DRAWN AEROBIC ONLY Blood Culture adequate volume Performed at [facility] [address], [place], [place] [address]    Culture   Final    NO GROWTH 3 DAYS Performed at [facility], [place] [address],  [place], [place] [address]    Report Status PENDING  Incomplete  Resp Panel by RT-PCR (Flu A&B, Covid) Nasopharyngeal Swab     Status: None   Collection  Time: 11/25/20 11:23 PM   Specimen: Nasopharyngeal Swab; Nasopharyngeal(NP) swabs in vial transport medium  Result Value Ref Range Status   SARS Coronavirus 2 by RT PCR NEGATIVE NEGATIVE Final    Comment: (NOTE) SARS-CoV-2 target nucleic acids are NOT DETECTED.  The SARS-CoV-2 RNA is generally detectable in upper respiratory specimens during the acute phase of infection. The lowest concentration of SARS-CoV-2 viral copies this assay can detect is 138 copies/mL. A negative result does not preclude SARS-Cov-2 infection and should not be used as the sole basis for treatment or other patient management decisions. A negative result may occur with  improper specimen collection/handling, submission of specimen other than nasopharyngeal swab, presence of viral mutation(s) within the areas targeted by this assay, and inadequate number of viral copies(<138 copies/mL). A negative result must be combined with clinical observations, patient history, and epidemiological information. The expected result is Negative.  Fact Sheet for Patients:  EntrepreneurPulse.com.au  Fact Sheet for Healthcare Providers:  IncredibleEmployment.be  This test is no t yet approved or cleared by the [place] FDA and  has been authorized for detection and/or diagnosis of SARS-CoV-2 by FDA under an Emergency Use Authorization (EUA). This EUA will remain  in effect (meaning this test can be used) for the duration of the COVID-19 declaration under Section 564(b)(1) of the Act, 21 U.S.C.section 360bbb-3(b)(1), unless the authorization is terminated  or revoked sooner.       Influenza A by PCR NEGATIVE NEGATIVE Final   Influenza B by PCR NEGATIVE NEGATIVE Final    Comment: (NOTE) The Xpert Xpress SARS-CoV-2/FLU/RSV plus assay is  intended as an aid in the diagnosis of influenza from Nasopharyngeal swab specimens and should not be used as a sole basis for treatment. Nasal washings and aspirates are unacceptable for Xpert Xpress SARS-CoV-2/FLU/RSV testing.  Fact Sheet for Patients: EntrepreneurPulse.com.au  Fact Sheet for Healthcare Providers: IncredibleEmployment.be  This test is not yet approved or cleared by the [place] FDA and has been authorized for detection and/or diagnosis of SARS-CoV-2 by FDA under an Emergency Use Authorization (EUA). This EUA will remain in effect (meaning this test can be used) for the duration of the COVID-19 declaration under Section 564(b)(1) of the Act, 21 U.S.C. section 360bbb-3(b)(1), unless the authorization is terminated or revoked.  Performed at [facility], [place] [address] [place], [place] [address]   Culture, blood (routine x 2)     Status: None (Preliminary result)   Collection Time: 11/26/20  2:28 AM   Specimen: BLOOD  Result Value Ref Range Status   Specimen Description   Final    BLOOD LEFT WRIST Performed at [facility] [address] [place], [place] [address]    Special Requests   Final    BOTTLES DRAWN AEROBIC ONLY Blood Culture results may not be optimal due to an inadequate volume of blood received in culture bottles Performed at [facility] [address], [place], [place] [address]    Culture   Final    NO GROWTH 3 DAYS Performed at [facility], [place] [address] [place], [place] [address]    Report Status PENDING  Incomplete  MRSA Next Gen by PCR, Nasal     Status: None   Collection Time: 11/26/20  3:41 AM   Specimen: Nasal Mucosa; Nasal Swab  Result Value Ref Range Status   MRSA by PCR Next Gen NOT DETECTED NOT DETECTED Final    Comment: (NOTE) The GeneXpert MRSA Assay (  FDA approved for NASAL specimens only), is one component of a  comprehensive MRSA colonization surveillance program. It is not intended to diagnose MRSA infection nor to guide or monitor treatment for MRSA infections. Test performance is not FDA approved in patients less than 71 years old. Performed at [facility], [place] [address] [place], [place] [address]   Resp Panel by RT-PCR (Flu A&B, Covid) Nasopharyngeal Swab     Status: None   Collection Time: 11/29/20  3:26 PM   Specimen: Nasopharyngeal Swab; Nasopharyngeal(NP) swabs in vial transport medium  Result Value Ref Range Status   SARS Coronavirus 2 by RT PCR NEGATIVE NEGATIVE Final    Comment: (NOTE) SARS-CoV-2 target nucleic acids are NOT DETECTED.  The SARS-CoV-2 RNA is generally detectable in upper respiratory specimens during the acute phase of infection. The lowest concentration of SARS-CoV-2 viral copies this assay can detect is 138 copies/mL. A negative result does not preclude SARS-Cov-2 infection and should not be used as the sole basis for treatment or other patient management decisions. A negative result may occur with  improper specimen collection/handling, submission of specimen other than nasopharyngeal swab, presence of viral mutation(s) within the areas targeted by this assay, and inadequate number of viral copies(<138 copies/mL). A negative result must be combined with clinical observations, patient history, and epidemiological information. The expected result is Negative.  Fact Sheet for Patients:  EntrepreneurPulse.com.au  Fact Sheet for Healthcare Providers:  IncredibleEmployment.be  This test is no t yet approved or cleared by the [place] FDA and  has been authorized for detection and/or diagnosis of SARS-CoV-2 by FDA under an Emergency Use Authorization (EUA). This EUA will remain  in effect (meaning this test can be used) for the duration of the COVID-19 declaration under Section 564(b)(1) of the Act,  21 U.S.C.section 360bbb-3(b)(1), unless the authorization is terminated  or revoked sooner.       Influenza A by PCR NEGATIVE NEGATIVE Final   Influenza B by PCR NEGATIVE NEGATIVE Final    Comment: (NOTE) The Xpert Xpress SARS-CoV-2/FLU/RSV plus assay is intended as an aid in the diagnosis of influenza from Nasopharyngeal swab specimens and should not be used as a sole basis for treatment. Nasal washings and aspirates are unacceptable for Xpert Xpress SARS-CoV-2/FLU/RSV testing.  Fact Sheet for Patients: EntrepreneurPulse.com.au  Fact Sheet for Healthcare Providers: IncredibleEmployment.be  This test is not yet approved or cleared by the [place] FDA and has been authorized for detection and/or diagnosis of SARS-CoV-2 by FDA under an Emergency Use Authorization (EUA). This EUA will remain in effect (meaning this test can be used) for the duration of the COVID-19 declaration under Section 564(b)(1) of the Act, 21 U.S.C. section 360bbb-3(b)(1), unless the authorization is terminated or revoked.  Performed at [facility], [place] [address] [place],  [address]     Labs: BNP (last 3 results) Recent Labs    11/25/20 2155  BNP 098.1*   Basic Metabolic Panel: Recent Labs  Lab 11/26/20 0228 11/27/20 2019 11/28/20 0440 11/29/20 0432 11/30/20 0410  NA 136 139 138 137 136  K 4.0 5.0 4.3 3.6 4.1  CL 101 107 106 105 102  CO2 24 23 23 25 27   GLUCOSE 115* 277* 226* 298* 201*  BUN 27* 33* 34* 35* 30*  CREATININE 1.49* 1.17* 1.18* 1.13* 1.06*  CALCIUM 8.5* 8.7* 8.8* 8.8* 8.7*  MG 1.7 1.7  --  1.7 2.0  PHOS  --   --  2.1* 1.6* 2.8  Liver Function Tests: Recent Labs  Lab 11/25/20 2155 11/27/20 2019 11/28/20 0440 11/29/20 0432 11/30/20 0410  AST 22 25  --  15 17  ALT 22 24  --  20 21  ALKPHOS 88 125  --  116 123  BILITOT 0.7 0.6  --  0.5 0.6  PROT 5.3* 5.5*  --  5.3* 5.2*  ALBUMIN 2.1* 2.3* 2.1* 2.1*  2.1*   No results for input(s): LIPASE, AMYLASE in the last 168 hours. No results for input(s): AMMONIA in the last 168 hours. CBC: Recent Labs  Lab 11/25/20 2155 11/26/20 0228 11/27/20 2019 11/28/20 0440 11/29/20 0432 11/30/20 0410  WBC 10.4 11.9* 21.8* 22.3* 20.3* 14.0*  NEUTROABS 9.2*  --   --   --  17.9* 11.4*  HGB 9.2* 9.9* 9.8* 9.2* 9.2* 9.0*  HCT 28.2* 30.0* 30.9* 28.1* 27.8* 28.0*  MCV 99.3 98.7 102.3* 100.0 98.9 101.8*  PLT 189 191 160 197 186 182   Cardiac Enzymes: No results for input(s): CKTOTAL, CKMB, CKMBINDEX, TROPONINI in the last 168 hours. BNP: Invalid input(s): POCBNP CBG: Recent Labs  Lab 11/29/20 1231 11/29/20 1619 11/29/20 2121 11/30/20 0740 11/30/20 1108  GLUCAP 167* 169* 276* 182* 166*   D-Dimer No results for input(s): DDIMER in the last 72 hours. Hgb A1c No results for input(s): HGBA1C in the last 72 hours. Lipid Profile No results for input(s): CHOL, HDL, LDLCALC, TRIG, CHOLHDL, LDLDIRECT in the last 72 hours. Thyroid function studies No results for input(s): TSH, T4TOTAL, T3FREE, THYROIDAB in the last 72 hours.  Invalid input(s): FREET3 Anemia work up No results for input(s): VITAMINB12, FOLATE, FERRITIN, TIBC, IRON, RETICCTPCT in the last 72 hours. Urinalysis    Component Value Date/Time   COLORURINE YELLOW 11/25/2020 0726   APPEARANCEUR CLEAR 11/25/2020 0726   LABSPEC 1.019 11/25/2020 0726   PHURINE 5.0 11/25/2020 0726   GLUCOSEU NEGATIVE 11/25/2020 0726   HGBUR NEGATIVE 11/25/2020 0726   BILIRUBINUR NEGATIVE 11/25/2020 0726   KETONESUR NEGATIVE 11/25/2020 0726   PROTEINUR NEGATIVE 11/25/2020 0726   NITRITE NEGATIVE 11/25/2020 0726   LEUKOCYTESUR TRACE (A) 11/25/2020 0726   Sepsis Labs Invalid input(s): PROCALCITONIN,  WBC,  LACTICIDVEN Microbiology Recent Results (from the past 240 hour(s))  Culture, blood (routine x 2)     Status: None (Preliminary result)   Collection Time: 11/25/20  9:55 PM   Specimen: BLOOD  Result  Value Ref Range Status   Specimen Description   Final    BLOOD LEFT ARM Performed at [facility], [place] [address] [place], [place] [address]    Special Requests   Final    BOTTLES DRAWN AEROBIC ONLY Blood Culture adequate volume Performed at [facility] [address], [place], [place] [address]    Culture   Final    NO GROWTH 3 DAYS Performed at [facility], [place] [address], [place], [place] [address]    Report Status PENDING  Incomplete  Resp Panel by RT-PCR (Flu A&B, Covid) Nasopharyngeal Swab     Status: None   Collection Time: 11/25/20 11:23 PM   Specimen: Nasopharyngeal Swab; Nasopharyngeal(NP) swabs in vial transport medium  Result Value Ref Range Status   SARS Coronavirus 2 by RT PCR NEGATIVE NEGATIVE Final    Comment: (NOTE) SARS-CoV-2 target nucleic acids are NOT DETECTED.  The SARS-CoV-2 RNA is generally detectable in upper respiratory specimens during the acute phase of infection. The lowest concentration of SARS-CoV-2 viral copies this assay can detect is 138 copies/mL.  A negative result does not preclude SARS-Cov-2 infection and should not be used as the sole basis for treatment or other patient management decisions. A negative result may occur with  improper specimen collection/handling, submission of specimen other than nasopharyngeal swab, presence of viral mutation(s) within the areas targeted by this assay, and inadequate number of viral copies(<138 copies/mL). A negative result must be combined with clinical observations, patient history, and epidemiological information. The expected result is Negative.  Fact Sheet for Patients:  EntrepreneurPulse.com.au  Fact Sheet for Healthcare Providers:  IncredibleEmployment.be  This test is no t yet approved or cleared by the [place] FDA and  has been authorized for detection and/or diagnosis of SARS-CoV-2 by FDA under an  Emergency Use Authorization (EUA). This EUA will remain  in effect (meaning this test can be used) for the duration of the COVID-19 declaration under Section 564(b)(1) of the Act, 21 U.S.C.section 360bbb-3(b)(1), unless the authorization is terminated  or revoked sooner.       Influenza A by PCR NEGATIVE NEGATIVE Final   Influenza B by PCR NEGATIVE NEGATIVE Final    Comment: (NOTE) The Xpert Xpress SARS-CoV-2/FLU/RSV plus assay is intended as an aid in the diagnosis of influenza from Nasopharyngeal swab specimens and should not be used as a sole basis for treatment. Nasal washings and aspirates are unacceptable for Xpert Xpress SARS-CoV-2/FLU/RSV testing.  Fact Sheet for Patients: EntrepreneurPulse.com.au  Fact Sheet for Healthcare Providers: IncredibleEmployment.be  This test is not yet approved or cleared by the [place] FDA and has been authorized for detection and/or diagnosis of SARS-CoV-2 by FDA under an Emergency Use Authorization (EUA). This EUA will remain in effect (meaning this test can be used) for the duration of the COVID-19 declaration under Section 564(b)(1) of the Act, 21 U.S.C. section 360bbb-3(b)(1), unless the authorization is terminated or revoked.  Performed at [facility], [place] [address] [place], [place] [address]   Culture, blood (routine x 2)     Status: None (Preliminary result)   Collection Time: 11/26/20  2:28 AM   Specimen: BLOOD  Result Value Ref Range Status   Specimen Description   Final    BLOOD LEFT WRIST Performed at [facility] [address] [place], [place] [address]    Special Requests   Final    BOTTLES DRAWN AEROBIC ONLY Blood Culture results may not be optimal due to an inadequate volume of blood received in culture bottles Performed at [facility] [address], [place], [place] [address]    Culture   Final    NO GROWTH 3  DAYS Performed at [facility], [place] [address], [place], [place] [address]    Report Status PENDING  Incomplete  MRSA Next Gen by PCR, Nasal     Status: None   Collection Time: 11/26/20  3:41 AM   Specimen: Nasal Mucosa; Nasal Swab  Result Value Ref Range Status   MRSA by PCR Next Gen NOT DETECTED NOT DETECTED Final    Comment: (NOTE) The GeneXpert MRSA Assay (FDA approved for NASAL specimens only), is one component of a comprehensive MRSA colonization surveillance program. It is not intended to diagnose MRSA infection nor to guide or monitor treatment for MRSA infections. Test performance is not FDA approved in patients less than 26 years old. Performed at [facility], [place] [address], [place], [place] [address]   Resp Panel by RT-PCR (Flu A&B, Covid) Nasopharyngeal Swab  Status: None   Collection Time: 11/29/20  3:26 PM   Specimen: Nasopharyngeal Swab; Nasopharyngeal(NP) swabs in vial transport medium  Result Value Ref Range Status   SARS Coronavirus 2 by RT PCR NEGATIVE NEGATIVE Final    Comment: (NOTE) SARS-CoV-2 target nucleic acids are NOT DETECTED.  The SARS-CoV-2 RNA is generally detectable in upper respiratory specimens during the acute phase of infection. The lowest concentration of SARS-CoV-2 viral copies this assay can detect is 138 copies/mL. A negative result does not preclude SARS-Cov-2 infection and should not be used as the sole basis for treatment or other patient management decisions. A negative result may occur with  improper specimen collection/handling, submission of specimen other than nasopharyngeal swab, presence of viral mutation(s) within the areas targeted by this assay, and inadequate number of viral copies(<138 copies/mL). A negative result must be combined with clinical observations, patient history, and epidemiological information. The expected result is Negative.  Fact Sheet for Patients:   EntrepreneurPulse.com.au  Fact Sheet for Healthcare Providers:  IncredibleEmployment.be  This test is no t yet approved or cleared by the [place] FDA and  has been authorized for detection and/or diagnosis of SARS-CoV-2 by FDA under an Emergency Use Authorization (EUA). This EUA will remain  in effect (meaning this test can be used) for the duration of the COVID-19 declaration under Section 564(b)(1) of the Act, 21 U.S.C.section 360bbb-3(b)(1), unless the authorization is terminated  or revoked sooner.       Influenza A by PCR NEGATIVE NEGATIVE Final   Influenza B by PCR NEGATIVE NEGATIVE Final    Comment: (NOTE) The Xpert Xpress SARS-CoV-2/FLU/RSV plus assay is intended as an aid in the diagnosis of influenza from Nasopharyngeal swab specimens and should not be used as a sole basis for treatment. Nasal washings and aspirates are unacceptable for Xpert Xpress SARS-CoV-2/FLU/RSV testing.  Fact Sheet for Patients: EntrepreneurPulse.com.au  Fact Sheet for Healthcare Providers: IncredibleEmployment.be  This test is not yet approved or cleared by the [place] FDA and has been authorized for detection and/or diagnosis of SARS-CoV-2 by FDA under an Emergency Use Authorization (EUA). This EUA will remain in effect (meaning this test can be used) for the duration of the COVID-19 declaration under Section 564(b)(1) of the Act, 21 U.S.C. section 360bbb-3(b)(1), unless the authorization is terminated or revoked.  Performed at [facility], [place] [address], [place], [place] [address]    Time coordinating discharge: 35 minutes  SIGNED:  Kerney Elbe, DO [facility] 11/30/2020, 12:57 PM Pager is on [place]  If 7PM-7AM, please contact night-coverage [email or web address]

## 2020-11-30 NOTE — TOC Progression Note (Addendum)
Transition of Care [facility]) - Progression Note    Patient Details  Name: ANILA BOJARSKI MRN: [id number] Date of Birth: Dec 03, 1940  [facility] CM/SW Contact  Leeroy Cha, RN Phone Number: 11/30/2020, 1:39 PM  Clinical Narrative:    Dc summary sent via hub to [facility], via fax to [facility]. TRANSport packet to the floor nurse along with room number and that [facility] is picking the patient up.  Expected Discharge Plan: [place] Barriers to Discharge: No Barriers Identified  Expected Discharge Plan and Services Expected Discharge Plan: [place]   Discharge Planning Services: CM Consult   Living arrangements for the past 2 months: [place] Expected Discharge Date: 11/30/20                                     Social Determinants of Health (SDOH) Interventions    Readmission Risk Interventions Readmission Risk Prevention Plan 11/26/2020  Transportation Screening Complete  PCP or Specialist Appt within 3-5 Days Complete  HRI or [place] Complete  Social Work Consult for [place] Planning/Counseling Complete  Palliative Care Screening Not Applicable  Medication Review Press photographer) Complete  Some recent data might be hidden

## 2020-11-30 NOTE — Telephone Encounter (Signed)
Contacted by hospitalist day of discharge.  Requesting pulmonary follow-up for hypoxemic respiratory failure and pneumonia on chest x-ray.  Please schedule follow-up with Dr. Silas Flood, new consult, in 3 to 4 weeks.  Patient will need chest x-ray prior to the visit with me.  Can be obtained same day as visit.

## 2020-11-30 NOTE — Plan of Care (Signed)
  Problem: Nutrition: Goal: Adequate nutrition will be maintained Outcome: Progressing   Problem: Elimination: Goal: Will not experience complications related to bowel motility Outcome: Progressing   Problem: Safety: Goal: Ability to remain free from injury will improve Outcome: Progressing   

## 2020-11-30 NOTE — Care Management Important Message (Signed)
Important Message  Patient Details IM Letter placed in Patents room. Name: Terry Alexander MRN: [id number] Date of Birth: [birth year]   Medicare Important Message Given:  Yes     [name] 11/30/2020, 12:38 PM

## 2020-12-01 LAB — CULTURE, BLOOD (ROUTINE X 2)
Culture: NO GROWTH
Culture: NO GROWTH
Special Requests: ADEQUATE

## 2020-12-03 NOTE — Telephone Encounter (Signed)
I called the pt and there was no answer, line just rings with no VM  Will call back again tomorrow

## 2020-12-04 ENCOUNTER — Encounter: Payer: Self-pay | Admitting: *Deleted

## 2020-12-04 NOTE — Telephone Encounter (Signed)
I have attempted to call the pt but the line keeps ringing and no answer or VM.

## 2020-12-04 NOTE — Telephone Encounter (Signed)
Called again and no answer or vm  Mailed letter

## 2020-12-13 ENCOUNTER — Ambulatory Visit: Payer: Medicare (Managed Care) | Admitting: Physician Assistant

## 2020-12-19 ENCOUNTER — Ambulatory Visit: Payer: Medicare (Managed Care) | Admitting: Internal Medicine

## 2020-12-31 ENCOUNTER — Encounter (HOSPITAL_COMMUNITY): Payer: Self-pay | Admitting: Radiology

## 2021-01-13 DEATH — deceased

## 2021-03-13 DIAGNOSIS — I429 Cardiomyopathy, unspecified: Secondary | ICD-10-CM | POA: Insufficient documentation

## 2021-03-13 DIAGNOSIS — E785 Hyperlipidemia, unspecified: Secondary | ICD-10-CM | POA: Insufficient documentation

## 2021-03-13 NOTE — Progress Notes (Unsigned)
Cardiology Office Note   Date:  03/13/2021   ID:  Terry Alexander, Terry Alexander Mar 31, 1940, MRN [id number]  PCP:  Janifer Adie, MD  Cardiologist:   Minus Breeding, MD Referring:  ***  No chief complaint on file.     History of Present Illness: LASHAWN BROMWELL is a 81 y.o. female who presents for follow up after hospitalization in Nov for pneumonia.  Prior to that she had been in the ED.  She had weakness at that time.  She had hyponatremia.  She had an EF of 45 - 50%.  She had AKI and ACE/ARB was held.    Troponin was elevated but this was managed conservatively.  ***   She was admitted to the hospital on 10/24/2020 and discharged on 10/26/2020.  She presented with symptoms of generalized weakness and a fall.  She was found down by her home health aide.  She typically uses a walker.  Her initial work-up showed hyponatremia, hyperkalemia, hypoalbuminemia, abnormal LFTs (AST), mild renal insufficiency, hypomagnesemia, lactic acidosis, and elevated troponins with a peak of 3405.  Her echocardiogram showed an EF of 45-50%, G1 DD, and mild MR.  Conservative management was recommended due to his frailty and confusion/agitation as well as her lack of anginal symptoms.  Titration of her beta blocker was recommended if tolerated.  ACE ARB or Arni and diuretic held due to AKI.        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     Ascorbic Acid (VITAMIN C) 1000 MG tablet Take 1,000 mg by mouth daily.     aspirin 81 MG  tablet Take 81 mg by mouth daily.     atorvastatin (LIPITOR) 10 MG tablet Take 10 mg by mouth at bedtime.     Cholecalciferol 25 MCG (1000 UT) tablet Take 1,000 Units by mouth daily.     cycloSPORINE (RESTASIS) 0.05 % ophthalmic emulsion Place 1 drop into both eyes 2 (two) times daily.     dorzolamide (TRUSOPT) 2 % ophthalmic solution Place 1 drop into both eyes 2 (two) times daily.     erythromycin ophthalmic ointment Place 1 application into the left eye 3 (three) times daily.     feeding supplement (ENSURE ENLIVE / ENSURE PLUS) LIQD Take 237 mLs by mouth daily. 741 mL 12   folic acid (FOLVITE) 1 MG tablet Take 2 tablets (2 mg total) by mouth daily. 60 tablet 2   gabapentin (NEURONTIN) 300 MG capsule Take 300 mg by mouth at bedtime.     guaiFENesin (MUCINEX) 600 MG 12 hr tablet Take 600 mg by mouth See admin instructions. Give 600mg  by mouth twice a day for 14 days     insulin aspart (NOVOLOG FLEXPEN) 100 UNIT/ML FlexPen Inject 10 Units into the skin See admin instructions. Inject 10 units into the skin if blood glucose is greater than 400  Insulin Pen Needle (NOVOFINE) 30G X 8 MM MISC Inject 1 packet into the skin as needed.     ipratropium-albuterol (DUONEB) 0.5-2.5 (3) MG/3ML SOLN Take 3 mLs by nebulization every 2 (two) hours as needed (for cough, wheezing, shortness of breath).     lidocaine (LIDODERM) 5 % Place 1 patch onto the skin daily. Apple once daily to leg at night for leg pain     linagliptin (TRADJENTA) 5 MG TABS tablet Take 5 mg by mouth daily.     losartan (COZAAR) 50 MG tablet Take 50 mg by mouth daily.     Magnesium 400 MG TABS Take 400 mg by mouth daily.     melatonin 1 MG TABS tablet Take 1 mg by mouth at bedtime.     mirtazapine (REMERON) 15 MG tablet Take 15 mg by mouth at bedtime.     moxifloxacin (VIGAMOX) 0.5 % ophthalmic solution Place 1 drop into both eyes daily.     Multiple Vitamin (MULTIVITAMIN WITH MINERALS) TABS tablet Take 1 tablet by mouth daily. 30 tablet  0   Nitroglycerin (RECTIV) 0.4 % OINT Place 1 application rectally in the morning and at bedtime.     nutrition supplement, JUVEN, (JUVEN) PACK Take 1 packet by mouth 2 (two) times daily between meals.  0   pantoprazole (PROTONIX) 40 MG tablet Take 40 mg by mouth daily.     Polyethyl Glycol-Propyl Glycol (SYSTANE) 0.4-0.3 % GEL ophthalmic gel Place 1 application into both eyes at bedtime.     prednisoLONE acetate (PRED FORTE) 1 % ophthalmic suspension Place 1 drop into the right eye daily in the afternoon.     pyridostigmine (MESTINON) 60 MG tablet Take 1 tablet (60 mg total) by mouth 3 (three) times daily. 90 tablet 3   timolol (BETIMOL) 0.5 % ophthalmic solution Place 1 drop into both eyes 2 (two) times daily.     torsemide (DEMADEX) 10 MG tablet Take 10 mg by mouth daily.     No current facility-administered medications for this visit.    Allergies:   Methotrexate derivatives and Codeine    ROS:  Please see the history of present illness.   Otherwise, review of systems are positive for {NONE DEFAULTED:18576}.   All other systems are reviewed and negative.    PHYSICAL EXAM: VS:  There were no vitals taken for this visit. , BMI There is no height or weight on file to calculate BMI. GENERAL:  Well appearing NECK:  No jugular venous distention, waveform within normal limits, carotid upstroke brisk and symmetric, no bruits, no thyromegaly LUNGS:  Clear to auscultation bilaterally CHEST:  Unremarkable HEART:  PMI not displaced or sustained,S1 and S2 within normal limits, no S3, no S4, no clicks, no rubs, *** murmurs ABD:  Flat, positive bowel sounds normal in frequency in pitch, no bruits, no rebound, no guarding, no midline pulsatile mass, no hepatomegaly, no splenomegaly EXT:  2 plus pulses throughout, no edema, no cyanosis no clubbing    ***GENERAL:  Well appearing HEENT:  Pupils equal round and reactive, fundi not visualized, oral mucosa unremarkable NECK:  No jugular venous  distention, waveform within normal limits, carotid upstroke brisk and symmetric, no bruits, no thyromegaly LYMPHATICS:  No cervical, inguinal adenopathy LUNGS:  Clear to auscultation bilaterally BACK:  No CVA tenderness CHEST:  Unremarkable HEART:  PMI not displaced or sustained,S1 and S2 within normal limits, no S3, no S4, no clicks, no rubs, *** murmurs ABD:  Flat, positive bowel sounds normal in frequency  in pitch, no bruits, no rebound, no guarding, no midline pulsatile mass, no hepatomegaly, no splenomegaly EXT:  2 plus pulses throughout, no edema, no cyanosis no clubbing SKIN:  No rashes no nodules NEURO:  Cranial nerves II through XII grossly intact, motor grossly intact throughout PSYCH:  Cognitively intact, oriented to person place and time    EKG:  EKG {ACTION; IS/IS YYF:11021117} ordered today. The ekg ordered today demonstrates ***   Recent Labs: 10/24/2020: TSH 3.369 11/25/2020: B Natriuretic Peptide 267.0 11/30/2020: ALT 21; BUN 30; Creatinine, Ser 1.06; Hemoglobin 9.0; Magnesium 2.0; Platelets 182; Potassium 4.1; Sodium 136    Lipid Panel    Component Value Date/Time   CHOL 101 10/24/2020 0447   TRIG 81 10/24/2020 0447   HDL 53 10/24/2020 0447   CHOLHDL 1.9 10/24/2020 0447   VLDL 16 10/24/2020 0447   LDLCALC 32 10/24/2020 0447      Wt Readings from Last 3 Encounters:  11/25/20 117 lb (53.1 kg)  10/26/20 124 lb 5.4 oz (56.4 kg)  09/19/20 125 lb (56.7 kg)      Other studies Reviewed: Additional studies/ records that were reviewed today include: ***. Review of the above records demonstrates:  Please see elsewhere in the note.  ***   ASSESSMENT AND PLAN:  Suspected subacute MI:   ***  -denies chest pain today.  Troponins peaked at 3405.  Not felt to be a good candidate for catheterization due to lack of symptoms, frailty, confusion, and agitation.   Continue aspirin, atorvastatin, nitroglycerin, losartan Heart healthy low-sodium diet-salty 6  given Increase physical activity as tolerated   Cardiomyopathy:  The EF was 45 - 50%.  ***  , mild MR-no increased DOE or activity intolerance.  EF on recent echocardiogram 45-50%. Continue losartan Heart healthy low-sodium diet-salty 6 given Increase physical activity as tolerated   Hyperlipidemia:  ***  -10/24/2020: Cholesterol 101; HDL 53; LDL Cholesterol 32; Triglycerides 81; VLDL 16 Continue aspirin, atorvastatin Heart healthy low-sodium diet-salty 6 given Increase physical activity as tolerated   Current medicines are reviewed at length with the patient today.  The patient {ACTIONS; HAS/DOES NOT HAVE:19233} concerns regarding medicines.  The following changes have been made:  {PLAN; NO CHANGE:13088:s}  Labs/ tests ordered today include: *** No orders of the defined types were placed in this encounter.    Disposition:   FU with ***    Signed, Minus Breeding, MD  03/13/2021 8:23 PM    [facility]

## 2021-03-14 ENCOUNTER — Ambulatory Visit: Payer: Medicare (Managed Care) | Admitting: Cardiology

## 2021-03-14 DIAGNOSIS — E785 Hyperlipidemia, unspecified: Secondary | ICD-10-CM

## 2021-03-14 DIAGNOSIS — I429 Cardiomyopathy, unspecified: Secondary | ICD-10-CM

## 2021-03-14 DIAGNOSIS — R778 Other specified abnormalities of plasma proteins: Secondary | ICD-10-CM

## 2021-03-26 ENCOUNTER — Encounter: Payer: Self-pay | Admitting: Cardiology

## 2021-03-29 ENCOUNTER — Encounter: Payer: Self-pay | Admitting: *Deleted

## 2023-03-20 IMAGING — DX DG CHEST 1V PORT
1 series · 1 of 1 positions shown · non-contrast
Comparison: Radiograph 11/29/2020

CLINICAL DATA: Shortness of breath

EXAM:
PORTABLE CHEST 1 VIEW

[chest ap]
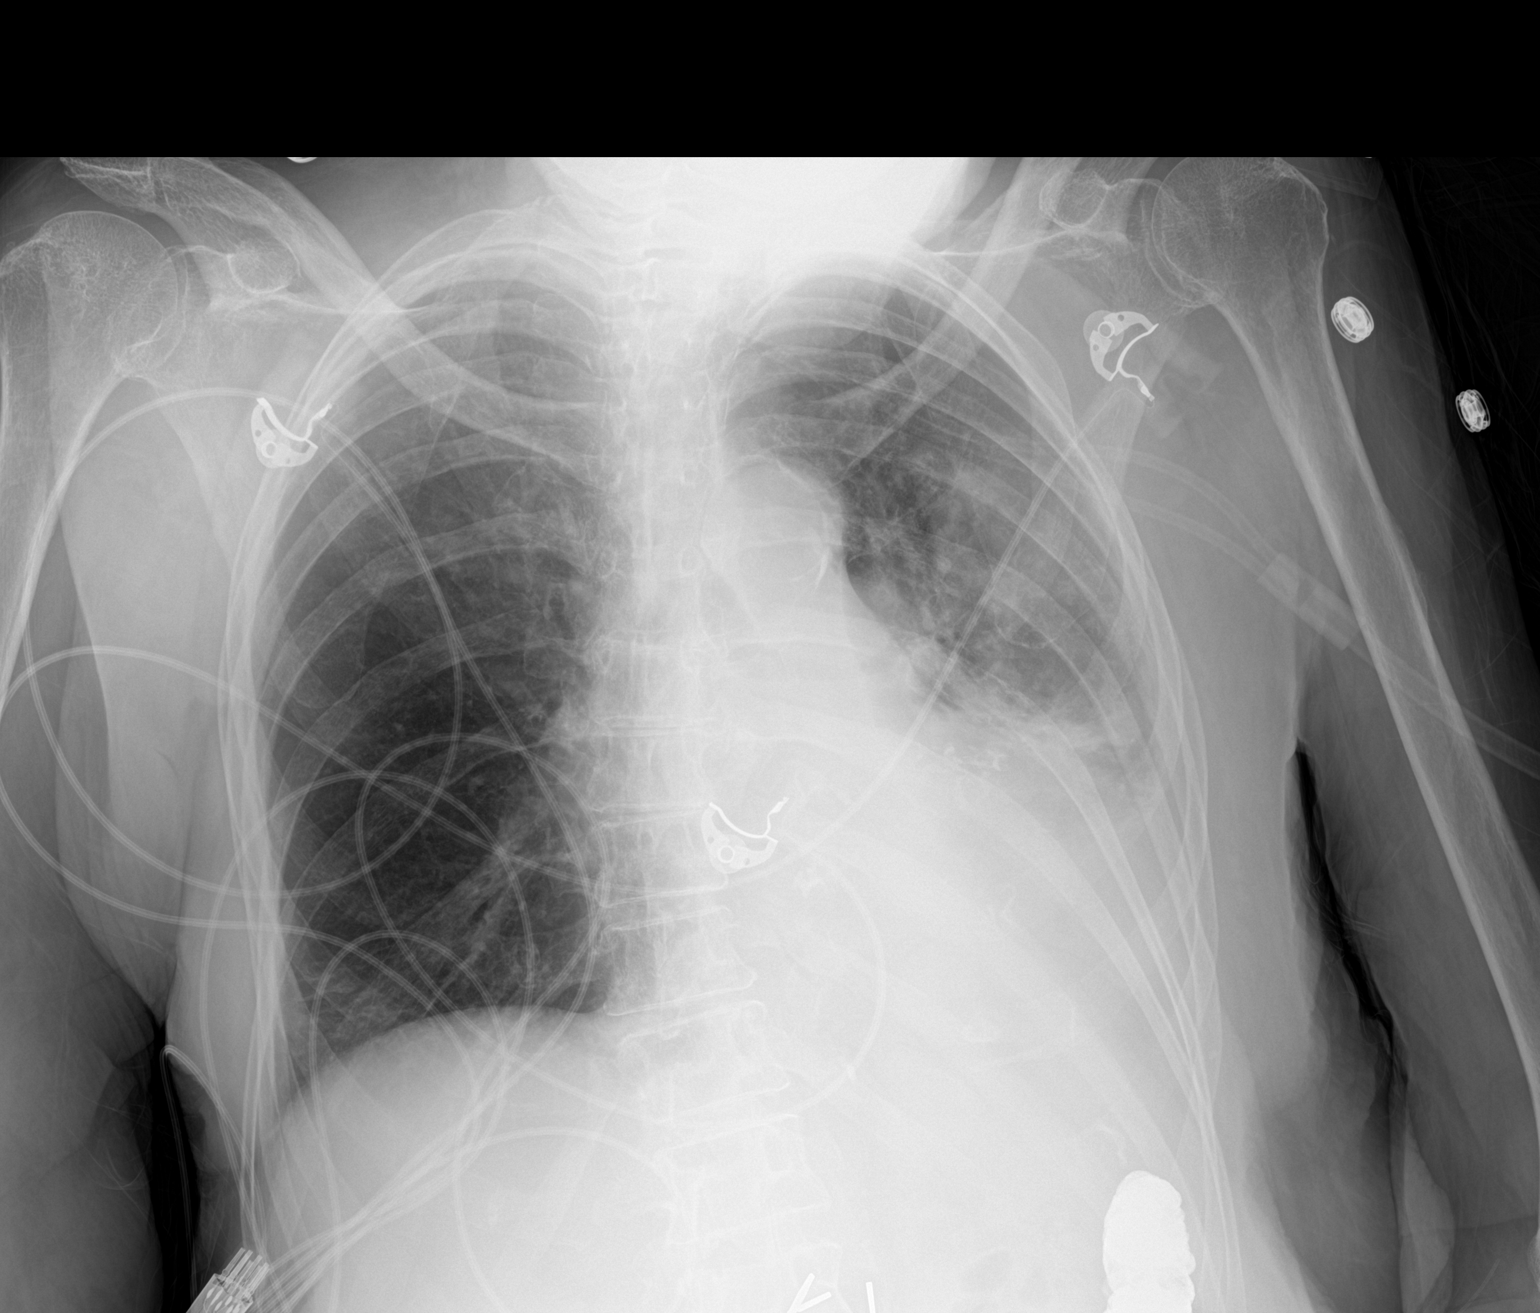

[1 of 1 positions shown; findings below may reference images not displayed]

FINDINGS: Unchanged, obscured cardiac silhouette. Unchanged left basilar
consolidation and left pleural effusion. Unchanged left upper lobe
airspace opacity, with possible central lucency. The right lung is
clear. No visible pneumothorax. Bilateral shoulder degenerative
changes. No acute osseous abnormality. There is contrast material
and left upper quadrant bowel.
IMPRESSION: Unchanged left basilar consolidation and probable effusion.
Unchanged left upper lung airspace opacity, possibly with central
cavitation. Chest CT would be useful for further evaluation.
